# Patient Record
Sex: Male | Born: 1940 | Race: White | Hispanic: No | Marital: Married | Smoking: Never smoker
Health system: Southern US, Community
[De-identification: ages and names within clinical notes are randomized; demographics above are authoritative.]

## PROBLEM LIST (undated history)

## (undated) DIAGNOSIS — E785 Hyperlipidemia, unspecified: Secondary | ICD-10-CM

## (undated) DIAGNOSIS — I4719 Other supraventricular tachycardia: Secondary | ICD-10-CM

## (undated) DIAGNOSIS — IMO0001 Reserved for inherently not codable concepts without codable children: Secondary | ICD-10-CM

## (undated) DIAGNOSIS — Z950 Presence of cardiac pacemaker: Secondary | ICD-10-CM

## (undated) DIAGNOSIS — H269 Unspecified cataract: Secondary | ICD-10-CM

## (undated) DIAGNOSIS — I428 Other cardiomyopathies: Secondary | ICD-10-CM

## (undated) DIAGNOSIS — F329 Major depressive disorder, single episode, unspecified: Secondary | ICD-10-CM

## (undated) DIAGNOSIS — I471 Supraventricular tachycardia: Secondary | ICD-10-CM

## (undated) DIAGNOSIS — I10 Essential (primary) hypertension: Secondary | ICD-10-CM

## (undated) DIAGNOSIS — K219 Gastro-esophageal reflux disease without esophagitis: Secondary | ICD-10-CM

## (undated) DIAGNOSIS — I509 Heart failure, unspecified: Secondary | ICD-10-CM

## (undated) DIAGNOSIS — M069 Rheumatoid arthritis, unspecified: Secondary | ICD-10-CM

## (undated) DIAGNOSIS — I442 Atrioventricular block, complete: Secondary | ICD-10-CM

## (undated) DIAGNOSIS — J189 Pneumonia, unspecified organism: Secondary | ICD-10-CM

## (undated) DIAGNOSIS — Z952 Presence of prosthetic heart valve: Secondary | ICD-10-CM

## (undated) DIAGNOSIS — F32A Depression, unspecified: Secondary | ICD-10-CM

## (undated) DIAGNOSIS — Z87448 Personal history of other diseases of urinary system: Secondary | ICD-10-CM

## (undated) DIAGNOSIS — Z8701 Personal history of pneumonia (recurrent): Secondary | ICD-10-CM

## (undated) HISTORY — DX: Personal history of pneumonia (recurrent): Z87.01

## (undated) HISTORY — DX: Other supraventricular tachycardia: I47.19

## (undated) HISTORY — DX: Depression, unspecified: F32.A

## (undated) HISTORY — DX: Hyperlipidemia, unspecified: E78.5

## (undated) HISTORY — PX: INSERT / REPLACE / REMOVE PACEMAKER: SUR710

## (undated) HISTORY — DX: Presence of prosthetic heart valve: Z95.2

## (undated) HISTORY — DX: Supraventricular tachycardia: I47.1

## (undated) HISTORY — DX: Essential (primary) hypertension: I10

## (undated) HISTORY — DX: Major depressive disorder, single episode, unspecified: F32.9

## (undated) HISTORY — DX: Unspecified cataract: H26.9

## (undated) HISTORY — DX: Pneumonia, unspecified organism: J18.9

## (undated) HISTORY — DX: Other cardiomyopathies: I42.8

## (undated) HISTORY — DX: Personal history of other diseases of urinary system: Z87.448

## (undated) HISTORY — PX: ANAL FISSURE REPAIR: SHX2312

## (undated) HISTORY — DX: Atrioventricular block, complete: I44.2

---

## 1991-07-04 HISTORY — PX: PACEMAKER INSERTION: SHX728

## 1991-07-04 HISTORY — PX: AORTIC VALVE REPLACEMENT: SHX41

## 1998-02-18 ENCOUNTER — Emergency Department (HOSPITAL_COMMUNITY): Admission: EM | Admit: 1998-02-18 | Discharge: 1998-02-18 | Payer: Self-pay | Admitting: Emergency Medicine

## 1999-08-07 ENCOUNTER — Encounter: Payer: Self-pay | Admitting: Emergency Medicine

## 1999-08-07 ENCOUNTER — Emergency Department (HOSPITAL_COMMUNITY): Admission: EM | Admit: 1999-08-07 | Discharge: 1999-08-07 | Payer: Self-pay | Admitting: Emergency Medicine

## 2004-05-10 ENCOUNTER — Ambulatory Visit: Payer: Self-pay | Admitting: Cardiology

## 2004-06-06 ENCOUNTER — Ambulatory Visit: Payer: Self-pay | Admitting: Cardiology

## 2004-12-12 ENCOUNTER — Ambulatory Visit: Payer: Self-pay | Admitting: Cardiology

## 2005-01-17 ENCOUNTER — Ambulatory Visit: Payer: Self-pay | Admitting: Cardiology

## 2005-03-28 ENCOUNTER — Ambulatory Visit: Payer: Self-pay | Admitting: *Deleted

## 2005-06-13 ENCOUNTER — Ambulatory Visit: Payer: Self-pay | Admitting: Cardiology

## 2005-08-30 ENCOUNTER — Ambulatory Visit: Payer: Self-pay | Admitting: Internal Medicine

## 2005-09-21 ENCOUNTER — Ambulatory Visit: Payer: Self-pay | Admitting: Cardiology

## 2005-10-19 ENCOUNTER — Ambulatory Visit: Payer: Self-pay | Admitting: Internal Medicine

## 2005-10-19 ENCOUNTER — Encounter: Payer: Self-pay | Admitting: Cardiology

## 2005-10-19 ENCOUNTER — Ambulatory Visit: Payer: Self-pay

## 2005-12-06 ENCOUNTER — Ambulatory Visit: Payer: Self-pay | Admitting: Cardiology

## 2006-02-20 ENCOUNTER — Ambulatory Visit: Payer: Self-pay | Admitting: Cardiology

## 2006-04-02 ENCOUNTER — Ambulatory Visit: Payer: Self-pay | Admitting: Cardiology

## 2006-07-06 ENCOUNTER — Ambulatory Visit: Payer: Self-pay | Admitting: Cardiology

## 2006-09-05 ENCOUNTER — Ambulatory Visit: Payer: Self-pay | Admitting: *Deleted

## 2006-10-23 ENCOUNTER — Ambulatory Visit: Payer: Self-pay | Admitting: Cardiology

## 2007-01-29 ENCOUNTER — Ambulatory Visit: Payer: Self-pay | Admitting: Cardiology

## 2007-02-14 ENCOUNTER — Ambulatory Visit: Payer: Self-pay | Admitting: Cardiovascular Disease

## 2007-03-20 ENCOUNTER — Ambulatory Visit: Payer: Self-pay | Admitting: Internal Medicine

## 2007-05-09 ENCOUNTER — Ambulatory Visit: Payer: Self-pay | Admitting: Cardiology

## 2007-05-17 ENCOUNTER — Encounter: Payer: Self-pay | Admitting: Internal Medicine

## 2007-05-17 DIAGNOSIS — E785 Hyperlipidemia, unspecified: Secondary | ICD-10-CM | POA: Insufficient documentation

## 2007-05-17 DIAGNOSIS — I10 Essential (primary) hypertension: Secondary | ICD-10-CM | POA: Insufficient documentation

## 2007-05-18 ENCOUNTER — Telehealth: Payer: Self-pay | Admitting: Family Medicine

## 2007-07-25 ENCOUNTER — Ambulatory Visit: Payer: Self-pay | Admitting: Cardiology

## 2007-08-29 ENCOUNTER — Ambulatory Visit: Payer: Self-pay | Admitting: Cardiology

## 2007-10-30 ENCOUNTER — Ambulatory Visit: Payer: Self-pay | Admitting: Cardiology

## 2007-11-20 ENCOUNTER — Ambulatory Visit: Payer: Self-pay | Admitting: Cardiology

## 2008-01-31 ENCOUNTER — Ambulatory Visit: Payer: Self-pay | Admitting: Cardiology

## 2008-04-06 ENCOUNTER — Ambulatory Visit: Payer: Self-pay | Admitting: Cardiology

## 2008-05-04 ENCOUNTER — Ambulatory Visit: Payer: Self-pay | Admitting: Cardiovascular Disease

## 2008-06-22 ENCOUNTER — Ambulatory Visit: Payer: Self-pay | Admitting: Cardiology

## 2008-08-12 ENCOUNTER — Ambulatory Visit: Payer: Self-pay | Admitting: Cardiology

## 2008-10-09 ENCOUNTER — Encounter: Payer: Self-pay | Admitting: Cardiology

## 2008-10-26 ENCOUNTER — Ambulatory Visit: Payer: Self-pay | Admitting: Cardiology

## 2008-12-01 ENCOUNTER — Encounter: Payer: Self-pay | Admitting: *Deleted

## 2008-12-07 ENCOUNTER — Ambulatory Visit: Payer: Self-pay | Admitting: Cardiovascular Disease

## 2008-12-07 LAB — CONVERTED CEMR LAB
POC INR: 2.7
Protime: 19.9

## 2008-12-21 ENCOUNTER — Encounter: Payer: Self-pay | Admitting: Cardiology

## 2009-01-06 ENCOUNTER — Encounter: Payer: Self-pay | Admitting: *Deleted

## 2009-01-08 ENCOUNTER — Ambulatory Visit: Payer: Self-pay | Admitting: Cardiology

## 2009-01-08 LAB — CONVERTED CEMR LAB
POC INR: 2
Prothrombin Time: 17.3 s

## 2009-02-05 ENCOUNTER — Ambulatory Visit: Payer: Self-pay | Admitting: Cardiology

## 2009-02-05 LAB — CONVERTED CEMR LAB
POC INR: 2
Prothrombin Time: 17.3 s

## 2009-02-19 ENCOUNTER — Ambulatory Visit: Payer: Self-pay | Admitting: Family Medicine

## 2009-02-19 DIAGNOSIS — R609 Edema, unspecified: Secondary | ICD-10-CM | POA: Insufficient documentation

## 2009-02-19 DIAGNOSIS — Z952 Presence of prosthetic heart valve: Secondary | ICD-10-CM | POA: Insufficient documentation

## 2009-02-19 DIAGNOSIS — F329 Major depressive disorder, single episode, unspecified: Secondary | ICD-10-CM | POA: Insufficient documentation

## 2009-02-19 DIAGNOSIS — R351 Nocturia: Secondary | ICD-10-CM | POA: Insufficient documentation

## 2009-02-19 DIAGNOSIS — B354 Tinea corporis: Secondary | ICD-10-CM | POA: Insufficient documentation

## 2009-02-19 DIAGNOSIS — F3289 Other specified depressive episodes: Secondary | ICD-10-CM | POA: Insufficient documentation

## 2009-02-23 ENCOUNTER — Ambulatory Visit: Payer: Self-pay | Admitting: Internal Medicine

## 2009-02-23 LAB — CONVERTED CEMR LAB: POC INR: 2.4

## 2009-03-18 DIAGNOSIS — Z950 Presence of cardiac pacemaker: Secondary | ICD-10-CM | POA: Insufficient documentation

## 2009-03-19 ENCOUNTER — Encounter: Payer: Self-pay | Admitting: Cardiology

## 2009-03-19 ENCOUNTER — Ambulatory Visit: Payer: Self-pay | Admitting: Cardiology

## 2009-03-19 ENCOUNTER — Ambulatory Visit: Payer: Self-pay | Admitting: Internal Medicine

## 2009-03-19 LAB — CONVERTED CEMR LAB: POC INR: 2.7

## 2009-04-28 ENCOUNTER — Ambulatory Visit: Payer: Self-pay | Admitting: Cardiology

## 2009-04-28 LAB — CONVERTED CEMR LAB: POC INR: 2.1

## 2009-05-12 ENCOUNTER — Ambulatory Visit: Payer: Self-pay | Admitting: Family Medicine

## 2009-05-12 DIAGNOSIS — F528 Other sexual dysfunction not due to a substance or known physiological condition: Secondary | ICD-10-CM | POA: Insufficient documentation

## 2009-05-12 DIAGNOSIS — M129 Arthropathy, unspecified: Secondary | ICD-10-CM | POA: Insufficient documentation

## 2009-05-12 DIAGNOSIS — I10 Essential (primary) hypertension: Secondary | ICD-10-CM | POA: Insufficient documentation

## 2009-05-12 LAB — CONVERTED CEMR LAB
Bilirubin Urine: NEGATIVE
Blood in Urine, dipstick: NEGATIVE
Glucose, Urine, Semiquant: NEGATIVE
Ketones, urine, test strip: NEGATIVE
Nitrite: NEGATIVE
Protein, U semiquant: NEGATIVE
Specific Gravity, Urine: 1.01
Urobilinogen, UA: 0.2
WBC Urine, dipstick: NEGATIVE
pH: 7

## 2009-05-23 LAB — CONVERTED CEMR LAB
ALT: 22 units/L (ref 0–53)
AST: 14 units/L (ref 0–37)
Albumin: 4.3 g/dL (ref 3.5–5.2)
BUN: 14 mg/dL (ref 6–23)
Basophils Relative: 1.1 % (ref 0.0–3.0)
CO2: 30 meq/L (ref 19–32)
Chloride: 103 meq/L (ref 96–112)
Cholesterol: 231 mg/dL — ABNORMAL HIGH (ref 0–200)
Creatinine, Ser: 1 mg/dL (ref 0.4–1.5)
Direct LDL: 145.6 mg/dL
Eosinophils Absolute: 0.4 10*3/uL (ref 0.0–0.7)
Eosinophils Relative: 6.5 % — ABNORMAL HIGH (ref 0.0–5.0)
HCT: 39.6 % (ref 39.0–52.0)
Lymphs Abs: 2.1 10*3/uL (ref 0.7–4.0)
MCHC: 34.6 g/dL (ref 30.0–36.0)
MCV: 89 fL (ref 78.0–100.0)
Monocytes Absolute: 0.7 10*3/uL (ref 0.1–1.0)
Neutrophils Relative %: 51.8 % (ref 43.0–77.0)
PSA: 1.46 ng/mL (ref 0.10–4.00)
Potassium: 4.1 meq/L (ref 3.5–5.1)
RBC: 4.45 M/uL (ref 4.22–5.81)
Total Bilirubin: 0.9 mg/dL (ref 0.3–1.2)
Total CHOL/HDL Ratio: 6
Total Protein: 6.9 g/dL (ref 6.0–8.3)

## 2009-06-18 ENCOUNTER — Ambulatory Visit: Payer: Self-pay | Admitting: Cardiovascular Disease

## 2009-07-30 ENCOUNTER — Ambulatory Visit: Payer: Self-pay | Admitting: Internal Medicine

## 2009-08-26 ENCOUNTER — Telehealth: Payer: Self-pay | Admitting: Family Medicine

## 2009-08-27 ENCOUNTER — Ambulatory Visit: Payer: Self-pay | Admitting: Cardiology

## 2009-09-24 ENCOUNTER — Ambulatory Visit: Payer: Self-pay | Admitting: Internal Medicine

## 2009-10-21 ENCOUNTER — Ambulatory Visit: Payer: Self-pay | Admitting: Cardiology

## 2009-11-18 ENCOUNTER — Ambulatory Visit: Payer: Self-pay | Admitting: Internal Medicine

## 2009-11-18 LAB — CONVERTED CEMR LAB: POC INR: 2.6

## 2009-12-02 ENCOUNTER — Telehealth: Payer: Self-pay | Admitting: Family Medicine

## 2009-12-21 ENCOUNTER — Encounter: Payer: Self-pay | Admitting: Family Medicine

## 2009-12-21 ENCOUNTER — Telehealth: Payer: Self-pay | Admitting: Family Medicine

## 2010-01-26 ENCOUNTER — Ambulatory Visit: Payer: Self-pay | Admitting: Cardiology

## 2010-01-26 LAB — CONVERTED CEMR LAB: POC INR: 3.1

## 2010-02-23 ENCOUNTER — Ambulatory Visit: Payer: Self-pay | Admitting: Internal Medicine

## 2010-02-23 LAB — CONVERTED CEMR LAB: POC INR: 2.7

## 2010-03-23 ENCOUNTER — Ambulatory Visit: Payer: Self-pay | Admitting: Cardiology

## 2010-03-24 ENCOUNTER — Ambulatory Visit: Payer: Self-pay | Admitting: Cardiology

## 2010-04-20 ENCOUNTER — Ambulatory Visit: Payer: Self-pay | Admitting: Internal Medicine

## 2010-04-20 LAB — CONVERTED CEMR LAB: POC INR: 3

## 2010-04-29 ENCOUNTER — Encounter (INDEPENDENT_AMBULATORY_CARE_PROVIDER_SITE_OTHER): Payer: Self-pay | Admitting: *Deleted

## 2010-05-05 ENCOUNTER — Ambulatory Visit: Payer: Self-pay | Admitting: Family Medicine

## 2010-05-05 DIAGNOSIS — M25519 Pain in unspecified shoulder: Secondary | ICD-10-CM | POA: Insufficient documentation

## 2010-05-06 LAB — CONVERTED CEMR LAB
BUN: 17 mg/dL (ref 6–23)
Basophils Absolute: 0.1 10*3/uL (ref 0.0–0.1)
Creatinine, Ser: 1 mg/dL (ref 0.4–1.5)
Eosinophils Relative: 5.5 % — ABNORMAL HIGH (ref 0.0–5.0)
GFR calc non Af Amer: 76.88 mL/min (ref >60)
Glucose, Bld: 75 mg/dL (ref 70–99)
HCT: 41.3 % (ref 39.0–52.0)
Lymphs Abs: 2.6 10*3/uL (ref 0.7–4.0)
Monocytes Absolute: 0.8 10*3/uL (ref 0.1–1.0)
Monocytes Relative: 8.7 % (ref 3.0–12.0)
Neutrophils Relative %: 57.4 % (ref 43.0–77.0)
PSA: 1.46 ng/mL (ref 0.10–4.00)
Platelets: 331 10*3/uL (ref 150.0–400.0)
RDW: 14.2 % (ref 11.5–14.6)
Sodium: 141 meq/L (ref 135–145)
TSH: 1.61 microintl units/mL (ref 0.35–5.50)
WBC: 9.5 10*3/uL (ref 4.5–10.5)

## 2010-05-20 ENCOUNTER — Ambulatory Visit: Payer: Self-pay | Admitting: Family Medicine

## 2010-06-24 ENCOUNTER — Ambulatory Visit: Payer: Self-pay | Admitting: Cardiology

## 2010-07-01 ENCOUNTER — Telehealth: Payer: Self-pay | Admitting: Family Medicine

## 2010-07-05 ENCOUNTER — Telehealth: Payer: Self-pay | Admitting: Family Medicine

## 2010-07-05 ENCOUNTER — Ambulatory Visit: Admit: 2010-07-05 | Payer: Self-pay | Admitting: Family Medicine

## 2010-07-06 ENCOUNTER — Ambulatory Visit
Admission: RE | Admit: 2010-07-06 | Discharge: 2010-07-06 | Payer: Self-pay | Source: Home / Self Care | Attending: Family Medicine | Admitting: Family Medicine

## 2010-07-06 DIAGNOSIS — M67919 Unspecified disorder of synovium and tendon, unspecified shoulder: Secondary | ICD-10-CM | POA: Insufficient documentation

## 2010-07-06 DIAGNOSIS — M719 Bursopathy, unspecified: Secondary | ICD-10-CM

## 2010-07-19 ENCOUNTER — Ambulatory Visit: Admit: 2010-07-19 | Payer: Self-pay

## 2010-07-20 ENCOUNTER — Telehealth: Payer: Self-pay | Admitting: Family Medicine

## 2010-07-31 LAB — CONVERTED CEMR LAB: POC INR: 3

## 2010-08-01 ENCOUNTER — Telehealth: Payer: Self-pay | Admitting: Family Medicine

## 2010-08-02 NOTE — Medication Information (Signed)
Summary: rov/ewj  Anticoagulant Therapy  Managed by: Bethena Midget, RN, BSN Referring MD: Charlies Constable MD PCP: Judithann Sheen MD Supervising MD: Myrtis Ser MD, Tinnie Gens Indication 1: Aortic Valve Replacement (ICD-V43.3) Indication 2: Aortic Valve Disorder (ICD-424.1) Lab Used: [facility] Site: [facility] INR POC 2.6 INR RANGE 2.50-3.50  Dietary changes: no    Health status changes: no    Bleeding/hemorrhagic complications: no    Recent/future hospitalizations: no    Any changes in medication regimen? no    Recent/future dental: no  Any missed doses?: no       Is patient compliant with meds? yes       Allergies: No Known Drug Allergies  Anticoagulation Management History:      The patient is taking warfarin and comes in today for a routine follow up visit.  Positive risk factors for bleeding include an age of 70 years or older.  The bleeding index is 'intermediate risk'.  Positive CHADS2 values include History of HTN.  Negative CHADS2 values include Age > 35 years old.  The start date was 11/06/1997.  Anticoagulation responsible provider: Myrtis Ser MD, Tinnie Gens.  INR POC: 2.6.  Cuvette Lot#: [id number].  Exp: 12/2010.    Anticoagulation Management Assessment/Plan:      The patient's current anticoagulation dose is Warfarin sodium 5 mg tabs: Use as directed by Anticoagulation Clinic.  The target INR is 2.5-3.5.  The next INR is due 11/18/2009.  Anticoagulation instructions were given to patient.  Results were reviewed/authorized by Bethena Midget, RN, BSN.  He was notified by Bethena Midget, RN, BSN.         Prior Anticoagulation Instructions: INR 2.6  Continue on same dosage 5mg  daily.  Recheck in 4 weeks.    Current Anticoagulation Instructions: INR 2.6 Continue 5mg s daily. Recheck in 4 weeks.

## 2010-08-02 NOTE — Assessment & Plan Note (Signed)
Summary: shoulder pain/dm   Vital Signs:  Patient profile:   70 year old male Height:      70 inches (177.80 cm) Weight:      192 pounds (87.27 kg) O2 Sat:      98 % on Room air Temp:     97.6 degrees F (36.44 degrees C) oral Pulse rate:   71 / minute BP sitting:   160 / 84  (left arm) Cuff size:   large  Vitals Entered By: Josph Macho RMA (May 20, 2010 9:24 AM)  O2 Flow:  Room air CC: Shoulder pain/ CF Is Patient Diabetic? No   History of Present Illness: Patient is a 69 year old Caucasian male in today for reevaluation of shoulder pain. his shoulder pain in both shoulders. and his left shoulder blade. He has some neck pain as well. at his last visit we checked x-rays of his neck and his shoulders and we did see some changes in his neck but nothing significant in her shoulders. His left shoulder is bothering him more than his right shoulder. He notes it is worse after sleeping he awakens in the morning sometimes that his left shoulder blade and sometimes radicular symptoms and pain down the upper part of his arm are noted. No numbness or tingling or weakness in his hands. Denies any falls, headaches, fevers, chills, chest pain, palpitations, shortness or breath, GI or GU complaints. He does he did not take his amlodipine and today and he has not been taking his triamterene HCTZ. He notes taking some Tylenol and cyclobenzaprine together at night one night to help his shoulder but he felt still tired the next morning after taking just one of the 5 mg cyclobenzaprine so he has not taken this in he has never seen a orthopedist for his neck or his shoulders and does not want to go thus far  Current Medications (verified): 1)  Warfarin Sodium 5 Mg Tabs (Warfarin Sodium) .... Use As Directed By Anticoagulation Clinic 2)  Triamterene-Hctz 37.5-25 Mg  Tabs (Triamterene-Hctz) .... Take 1/2 Tablet Once Daily 3)  Amlodipine Besylate 10 Mg Tabs (Amlodipine Besylate) .... Take One Tablet By  Mouth Daily 4)  Flomax 0.4 Mg Caps (Tamsulosin Hcl) .Marland Kitchen.. 1 Once Daily For Prevention of Nocturia 5)  Multivitamins   Tabs (Multiple Vitamin) .Marland Kitchen.. 1 Per Week 6)  Prilosec 20 Mg Cpdr (Omeprazole) .Marland Kitchen.. 1 Qd 7)  Fish Oil 1000 Mg Caps (Omega-3 Fatty Acids) .... Take 2 Capsule Daily 8)  Cyclobenzaprine Hcl 5 Mg Tabs (Cyclobenzaprine Hcl) .Marland Kitchen.. 1-2 Tabs By Mouth At Bedtime As Needed Pain/muscle Spasm 9)  Extra Strength Acetaminophen 500 Mg Caps (Acetaminophen) .... 2 Tabs By Mouth Two Times A Day and A 3rd Dose As Needed For Severe Pain  Allergies (verified): No Known Drug Allergies  Past History:  Past medical history reviewed for relevance to current acute and chronic problems. Social history (including risk factors) reviewed for relevance to current acute and chronic problems.  Past Medical History: Reviewed history from 03/18/2009 and no changes required. 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 Status post at [facility]. Jude aortic valve replacement for aortic     stenosis in 1993. 2. 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 3. Ejection fraction at 60%. 4. Hypertension. 5. Hyperlipidemia.   Social History: Reviewed history  from 05/05/2010 and no changes required. Marital Status: Married, accompanied by wife Children:  Occupation:   Review of Systems      See HPI  Physical Exam  General:  Well-developed,well-nourished,in no acute distress; alert,appropriate and cooperative throughout examination Head:  Normocephalic and atraumatic without obvious abnormalities. No apparent alopecia or balding. Mouth:  Oral mucosa and oropharynx without lesions or exudates.  Teeth in good repair. Neck:  No deformities, masses, or tenderness noted. Lungs:  Normal respiratory effort, chest expands symmetrically.  Lungs are clear to auscultation, no crackles or wheezes. Heart:  Normal rate and regular rhythm. S1 and S2 normal without galloop, rub or other extra sounds. Abdomen:  Bowel sounds positive,abdomen soft and non-tender without masses, organomegaly or hernias noted. Msk:  Mild pain with palp over b/l SCM muscles and over left Trapezius muscle Extremities:  1+ left pedal edema and 1+ right pedal edema.   Psych:  Cognition and judgment appear intact. Alert and cooperative with normal attention span and concentration. No apparent delusions, illusions, hallucinations   Impression & Recommendations:  Problem # 1:  SHOULDER PAIN, BILATERAL (ICD-719.41)  His updated medication list for this problem includes:    Cyclobenzaprine Hcl 5 Mg Tabs (Cyclobenzaprine hcl) .Marland Kitchen... 1-2 tabs by mouth at bedtime as needed pain/muscle spasm    Extra Strength Acetaminophen 500 Mg Caps (Acetaminophen) .Marland Kitchen... 2 tabs by mouth two times a day and a 3rd dose as needed for severe pain Likely related to degenerative changes, most notably in cervical spine. encouraged alternate heat and ice, try Lidoderm patches or if insurance will not pay for them try [facility] patches. Try just 1/2 of Cyclobenzaprine along with a Tylenol at bedtime if no improvement consider orthopaedic referral  Problem # 2:  ESSENTIAL HYPERTENSION, BENIGN (ICD-401.1)  His updated medication list for this problem includes:    Triamterene-hctz 37.5-25 Mg Tabs (Triamterene-hctz) .Marland Kitchen... Take 1/2 tablet once daily in am    Amlodipine Besylate 10 Mg Tabs (Amlodipine besylate) .Marland Kitchen... Take one tablet by mouth at bedtime Moved his Amlodipine to at bedtime and asked him to restart the Maxzide for the HTN and edema  Complete Medication List: 1)  Warfarin Sodium 5 Mg Tabs (Warfarin sodium) .... Use as directed by anticoagulation clinic 2)  Triamterene-hctz 37.5-25 Mg Tabs (Triamterene-hctz) .... Take 1/2 tablet once daily in am 3)  Amlodipine Besylate 10 Mg Tabs  (Amlodipine besylate) .... Take one tablet by mouth at bedtime 4)  Flomax 0.4 Mg Caps (Tamsulosin hcl) .Marland Kitchen.. 1 once daily for prevention of nocturia 5)  Multivitamins Tabs (Multiple vitamin) .Marland Kitchen.. 1 per week 6)  Prilosec 20 Mg Cpdr (Omeprazole) .Marland Kitchen.. 1 qd 7)  Fish Oil 1000 Mg Caps (Omega-3 fatty acids) .... Take 2 capsule daily 8)  Cyclobenzaprine Hcl 5 Mg Tabs (Cyclobenzaprine hcl) .Marland Kitchen.. 1-2 tabs by mouth at bedtime as needed pain/muscle spasm 9)  Extra Strength Acetaminophen 500 Mg Caps (Acetaminophen) .... 2 tabs by mouth two times a day and a 3rd dose as needed for severe pain 10)  Lidoderm 5 % Ptch (Lidocaine) .Marland Kitchen.. 1 patch topically to affected area off after 12 hours, 1 patch per area in 24 hours.  Patient Instructions: 1)  Please schedule a follow-up appointment in 2 months.  2)  Limit your Sodium(salt) .  3)  Elevate feet above heart for roughly 15 minutes twice a day when swelling occurs.  4)  Try Icy Hot patches, Memory Foam pillow 5)  Consider Orthopaedic consult about neck and shoulders. Prescriptions: LIDODERM  5 % PTCH (LIDOCAINE) 1 patch topically to affected area off after 12 hours, 1 patch per area in 24 hours.  #30 x 2   Entered and Authorized by:   Danise Edge MD   Signed by:   Danise Edge MD on 05/20/2010   Method used:   Electronically to        [facility]* (retail)       [address] [place], [place]  [address]       Ph: [phone]       Fax: [phone]   RxID:   [id number]    Orders Added: 1)  Est. Patient Level III [16010]

## 2010-08-02 NOTE — Medication Information (Signed)
Summary: rov/ln   Anticoagulant Therapy  Managed by: Weston Brass, PharmD Referring MD: Charlies Constable MD PCP: Judithann Sheen MD Supervising MD: Johney Frame MD, Fayrene Fearing Indication 1: Aortic Valve Replacement (ICD-V43.3) Indication 2: Aortic Valve Disorder (ICD-424.1) Lab Used: [facility] Site: [facility] INR POC 2.7 INR RANGE 2.50-3.50  Dietary changes: no    Health status changes: no    Bleeding/hemorrhagic complications: no    Recent/future hospitalizations: no    Any changes in medication regimen? no    Recent/future dental: no  Any missed doses?: no       Is patient compliant with meds? yes       Allergies: No Known Drug Allergies  Anticoagulation Management History:      The patient is taking warfarin and comes in today for a routine follow up visit.  Positive risk factors for bleeding include an age of 70 years or older.  The bleeding index is 'intermediate risk'.  Positive CHADS2 values include History of HTN.  Negative CHADS2 values include Age > 91 years old.  The start date was 11/06/1997.  Anticoagulation responsible provider: Melissia Lahman MD, Fayrene Fearing.  INR POC: 2.7.  Cuvette Lot#: [id number].  Exp: 04/2011.    Anticoagulation Management Assessment/Plan:      The patient's current anticoagulation dose is Warfarin sodium 5 mg tabs: Use as directed by Anticoagulation Clinic.  The target INR is 2.5-3.5.  The next INR is due 03/23/2010.  Anticoagulation instructions were given to patient.  Results were reviewed/authorized by Weston Brass, PharmD.  He was notified by Gweneth Fritter, PharmD Candidate.         Prior Anticoagulation Instructions: INR 3.1  Continue same dose of 1 tab daily.  Re-check in 4 weeks.  Current Anticoagulation Instructions: INR 2.7  Continue taking 1 tablet (5mg ) every day.  Recheck in 4 weeks.

## 2010-08-02 NOTE — Procedures (Signed)
Summary: Cardiology Device Clinic    Allergies: No Known Drug Allergies  PPM Specifications Following MD:  Everardo Beals. Juanda Chance, MD     PPM Vendor:  St Jude     PPM Model Number:  [id number]     PPM Serial Number:  [id number] PPM DOI:  08/26/1991     PPM Implanting MD:  Everardo Beals. Juanda Chance, MD  Lead 1    Location: [facility]     DOI: 08/26/1991     Model #: [id number]     Serial #: [id number]     Status: active Lead 2    Location: RV     DOI: 08/26/1991     Model #: [id number]     Serial #: [id number]     Status: active   Indications:  COMPLETE HEART BLOCK   PPM Follow Up Battery Voltage:  2.67 V     Battery Est. Longevity:  12 mths     Pacer Dependent:  No       PPM Device Measurements Atrium  Amplitude: 2.0 mV, Impedance: 486 ohms, Threshold: 0.5 V at 0.4 msec Right Ventricle  Amplitude: 5.0 mV, Impedance: 467 ohms, Threshold: 1.0 V at 0.8 msec  Episodes MS Episodes:  0     Coumadin:  Yes Ventricular High Rate:  0      Parameters Mode:  DDD     Lower Rate Limit:  50     Paced AV Delay:  225     Next Cardiology Appt Due:  09/05/2010 Tech Comments:  NORMAL DEVICE FUNCTION.  NO CHANGES MADE. PER JOHN AT [facility] 12 MTHS TO ERI W/100% PACING.  ROV IN 6 MTHS W/DEVICE CLINIC. Vella Kohler  March 24, 2010 9:53 AM

## 2010-08-02 NOTE — Assessment & Plan Note (Signed)
Summary: ROV/ SHOULDER PAIN/MM   Vital Signs:  Patient profile:   70 year old male Height:      70 inches (177.80 cm) Weight:      189 pounds (85.91 kg) O2 Sat:      98 % on Room air Temp:     97.5 degrees F (36.39 degrees C) oral Pulse rate:   67 / minute BP sitting:   116 / 84  (left arm) Cuff size:   large  Vitals Entered By: Josph Macho RMA (May 05, 2010 12:05 PM)  O2 Flow:  Room air CC: Shoulder pain (both) more pain in left shoulder X August/ CF Is Patient Diabetic? No   History of Present Illness: she is a 70 year old Caucasian male in today for evaluation of shoulder pain. He has been struggling with stiffness and pain in his shoulders since August. He denies any history nor recent history of, or injury. Describes the pain is worse at night and very stiff and painful in the morning. When he is up and moving around the pain is significantly improved. Pain is significantly worse than the left than the right but does affirm both sides. Tried multiple different physicians while sleeping and that has not been helpful he is using extra pillows and that has not been helpful. Tylenol arthritis with temporary pain but the pain recurs and he has been afraid to take the medication he chews warnings of taking excess medications. He does acknowledge both his parents is troubled with osteoarthritis over the years. He denies any significant neck pain, headaches. Denies any radicular symptoms or arm no pain further down the back. Denies chest pain, palpitations, shortness of breath, GI or GU concerns he  Current Medications (verified): 1)  Warfarin Sodium 5 Mg Tabs (Warfarin Sodium) .... Use As Directed By Anticoagulation Clinic 2)  Triamterene-Hctz 37.5-25 Mg  Tabs (Triamterene-Hctz) .... Take 1/2 Tablet Once Daily 3)  Tylenol 325 Mg  Tabs (Acetaminophen) .... As Needed 4)  Amlodipine Besylate 10 Mg Tabs (Amlodipine Besylate) .... Take One Tablet By Mouth Daily 5)  Flomax 0.4 Mg Caps  (Tamsulosin Hcl) .Marland Kitchen.. 1 Once Daily For Prevention of Nocturia 6)  Multivitamins   Tabs (Multiple Vitamin) .Marland Kitchen.. 1 Per Week 7)  Prilosec 20 Mg Cpdr (Omeprazole) .Marland Kitchen.. 1 Qd 8)  Fish Oil 1000 Mg Caps (Omega-3 Fatty Acids) .... Take 1 Capsule Daily  Allergies (verified): No Known Drug Allergies  Past History:  Past Surgical History: Last updated: 03/22/2010  St. Jude aortic valve replacement for aortic   stenosis in 1993.   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   Risk Factors: Smoking Status: never (05/17/2007)  Past medical history reviewed for relevance to current acute and chronic problems. Social history (including risk factors) reviewed for relevance to current acute and chronic problems.  Past Medical History: Reviewed history from 03/18/2009 and no changes required. 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 Status post at [facility]. Jude aortic valve replacement for aortic     stenosis in 1993. 2. 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 3. Ejection fraction at 60%. 4. Hypertension. 5. Hyperlipidemia.   Family History: Mother and father both lived into 33s, Mom still living,  both had arthritis  Social History: Reviewed history and no changes required. Marital Status: Married, accompanied by wife Children:  Occupation:   Review of Systems      See HPI       Flu Vaccine Consent Questions     Do you have a history of severe allergic reactions to this vaccine? no    Any prior history of allergic reactions to egg and/or gelatin? no    Do you have a sensitivity to the preservative Thimersol? no    Do you have a past history of Guillan-Barre Syndrome? no    Do you currently have an  acute febrile illness? no    Have you ever had a severe reaction to latex? no    Vaccine information given and explained to patient? yes    Are you currently pregnant? no    Lot Number:[id number]   Exp Date:12/31/2010   Site Given  Left Deltoid IM Josph Macho RMA  May 05, 2010 12:09 PM   Physical Exam  General:  Well-developed,well-nourished,in no acute distress; alert,appropriate and cooperative throughout examination Head:  Normocephalic and atraumatic without obvious abnormalities. No apparent alopecia or balding. Mouth:  Oral mucosa and oropharynx without lesions or exudates.  Teeth in good repair. Neck:  No deformities, masses, or tenderness noted. Lungs:  Normal respiratory effort, chest expands symmetrically. Lungs are clear to auscultation, no crackles or wheezes. Heart:  normal rate, regular rhythm, and systolic click.  Pacer in left upper chest wall Abdomen:  Bowel sounds positive,abdomen soft and non-tender without masses, organomegaly or hernias noted. Msk:  spasm noted over b/l posterior shoulders, mild pain with palp. No bony abnormalities, FROM both shoulders and in neck. No point tenderness over the actual shoulder girdle or in either arm. 5/5 strength in b/l UE. Gross motor and sensation intact in b/l UE Extremities:  No clubbing, cyanosis, edema, or deformity noted with normal full range of motion of all joints.   Cervical Nodes:  No lymphadenopathy noted Psych:  Cognition and judgment appear intact. Alert and cooperative with normal attention span and concentration. No apparent delusions, illusions, hallucinations   Impression & Recommendations:  Problem # 1:  SHOULDER PAIN, BILATERAL (ICD-719.41)  The following medications were removed from the medication list:    Tylenol 325 Mg Tabs (Acetaminophen) .Marland Kitchen... As needed His updated medication list for this problem includes:    Cyclobenzaprine Hcl 5 Mg Tabs (Cyclobenzaprine hcl) .Marland Kitchen... 1-2 tabs by mouth at bedtime  as needed pain/muscle spasm    Extra Strength Acetaminophen 500 Mg Caps (Acetaminophen) .Marland Kitchen... 2 tabs by mouth two times a day and a 3rd dose as needed for severe pain  Orders: T-Cervicle Spine 2-3 Views (72040TC) T-Shoulder Left Min 2 Views (73030TC) T-Shoulder Right (73030TC)Instructed on simple stretching after moist heat application  Problem # 2:  ESSENTIAL HYPERTENSION, BENIGN (ICD-401.1)  His updated medication list for this problem includes:    Triamterene-hctz 37.5-25 Mg Tabs (Triamterene-hctz) .Marland Kitchen... Take 1/2 tablet once daily    Amlodipine Besylate 10 Mg Tabs (Amlodipine besylate) .Marland Kitchen... Take one tablet by mouth daily  Orders: TLB-Renal Function Panel (80069-RENAL) TLB-CBC Platelet - w/Differential (85025-CBCD) TLB-TSH (Thyroid Stimulating Hormone) (84443-TSH) Venipuncture (91478) Specimen Handling (29562) No changes  Problem # 3:  AORTIC VALVE REPLACEMENT, HX OF (ICD-V43.3) On Coumadin will have him notify them of the changes in his meds when he goes for his next INR check in 2 weeks.  Problem # 4:  HYPERLIPIDEMIA (ICD-272.4) Increase fish oil caps to 2  daily  Complete Medication List: 1)  Warfarin Sodium 5 Mg Tabs (Warfarin sodium) .... Use as directed by anticoagulation clinic 2)  Triamterene-hctz 37.5-25 Mg Tabs (Triamterene-hctz) .... Take 1/2 tablet once daily 3)  Amlodipine Besylate 10 Mg Tabs (Amlodipine besylate) .... Take one tablet by mouth daily 4)  Flomax 0.4 Mg Caps (Tamsulosin hcl) .Marland Kitchen.. 1 once daily for prevention of nocturia 5)  Multivitamins Tabs (Multiple vitamin) .Marland Kitchen.. 1 per week 6)  Prilosec 20 Mg Cpdr (Omeprazole) .Marland Kitchen.. 1 qd 7)  Fish Oil 1000 Mg Caps (Omega-3 fatty acids) .... Take 2 capsule daily 8)  Cyclobenzaprine Hcl 5 Mg Tabs (Cyclobenzaprine hcl) .Marland Kitchen.. 1-2 tabs by mouth at bedtime as needed pain/muscle spasm 9)  Extra Strength Acetaminophen 500 Mg Caps (Acetaminophen) .... 2 tabs by mouth two times a day and a 3rd dose as needed for severe  pain  Other Orders: Flu Vaccine 105yrs + MEDICARE PATIENTS (G9562) Administration Flu vaccine - MCR (G0008) TLB-PSA (Prostate Specific Antigen) (84153-PSA)  Patient Instructions: 1)  Take 1000 mg of tylenol every 6 hours as needed for relief of pain or comfort of fever. Avoid taking more than 3000 mg in a 24 hour period( can cause liver damage in higher doses).  2)  You may move around but avoid painful motions. Apply moist heat and gentle stretching of shoulders and neck each evening as directed.  3)  Please schedule a follow-up appointment in 1- 2 months.  4)  Hepatic Panel prior to visit ICD-9: 401.9 5)  Lipid panel prior to visit ICD-9 : 272.4 Prescriptions: CYCLOBENZAPRINE HCL 5 MG TABS (CYCLOBENZAPRINE HCL) 1-2 tabs by mouth at bedtime as needed pain/muscle spasm  #40 x 1   Entered and Authorized by:   Danise Edge MD   Signed by:   Danise Edge MD on 05/05/2010   Method used:   Electronically to        [facility]* (retail)       [address] [place], [place]  [address]       Ph: [phone]       Fax: [phone]   RxID:   [id number]    Orders Added: 1)  Flu Vaccine 79yrs + MEDICARE PATIENTS [Q2039] 2)  Administration Flu vaccine - MCR [G0008] 3)  TLB-Renal Function Panel [80069-RENAL] 4)  TLB-CBC Platelet - w/Differential [85025-CBCD] 5)  TLB-TSH (Thyroid Stimulating Hormone) [84443-TSH] 6)  TLB-PSA (Prostate Specific Antigen) [84153-PSA] 7)  Venipuncture [59563] 8)  Specimen Handling [99000] 9)  T-Cervicle Spine 2-3 Views [72040TC] 10)  T-Shoulder Left Min 2 Views [73030TC] 11)  T-Shoulder Right [73030TC] 12)  Est. Patient Level IV [87564]

## 2010-08-02 NOTE — Medication Information (Signed)
Summary: rov/ewj  Anticoagulant Therapy  Managed by: Cloyde Reams, RN, BSN Referring MD: Charlies Constable MD PCP: Judithann Sheen MD Supervising MD: Tenny Craw MD, Gunnar Fusi Indication 1: Aortic Valve Replacement (ICD-V43.3) Indication 2: Aortic Valve Disorder (ICD-424.1) Lab Used: [facility]  Site: [facility] INR POC 2.6 INR RANGE 2.50-3.50  Dietary changes: no    Health status changes: no    Bleeding/hemorrhagic complications: no    Recent/future hospitalizations: no    Any changes in medication regimen? no    Recent/future dental: no  Any missed doses?: no       Is patient compliant with meds? yes       Allergies (verified): No Known Drug Allergies  Anticoagulation Management History:      The patient is taking warfarin and comes in today for a routine follow up visit.  Positive risk factors for bleeding include an age of 70 years or older.  The bleeding index is 'intermediate risk'.  Positive CHADS2 values include History of HTN.  Negative CHADS2 values include Age > 57 years old.  The start date was 11/06/1997.  Anticoagulation responsible provider: Tenny Craw MD, Gunnar Fusi.  INR POC: 2.6.  Cuvette Lot#: [id number].  Exp: 11/2010.    Anticoagulation Management Assessment/Plan:      The patient's current anticoagulation dose is Warfarin sodium 5 mg tabs: Use as directed by Anticoagulation Clinic.  The target INR is 2.5-3.5.  The next INR is due 10/21/2009.  Anticoagulation instructions were given to patient.  Results were reviewed/authorized by Cloyde Reams, RN, BSN.  He was notified by Cloyde Reams RN.         Prior Anticoagulation Instructions: INR 2.7  Continue on same dosage 1 tablet daily.  Recheck in 4 weeks.    Current Anticoagulation Instructions: INR 2.6  Continue on same dosage 5mg  daily.  Recheck in 4 weeks.

## 2010-08-02 NOTE — Medication Information (Signed)
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 no    Recent/future dental: no  Any missed doses?: no       Is patient compliant with meds? yes       Allergies: No Known Drug Allergies  Anticoagulation Management History:      The patient is taking warfarin and comes in today for a routine follow up visit.  Positive risk factors for bleeding include an age of 70 years or older.  The bleeding index is 'intermediate risk'.  Positive CHADS2 values include History of HTN.  Negative CHADS2 values include Age > 70 years old.  The start date was 11/06/1997.  Anticoagulation responsible provider: Jens Som MD, Arlys John.  INR POC: 2.7.  Cuvette Lot#: [id number].  Exp: 10/2010.    Anticoagulation Management Assessment/Plan:      The patient's current anticoagulation dose is Warfarin sodium 5 mg tabs: Use as directed by Anticoagulation Clinic.  The target INR is 2.5-3.5.  The next INR is due 09/24/2009.  Anticoagulation instructions were given to patient.  Results were reviewed/authorized by Cloyde Reams, RN, BSN.  He was notified by Cloyde Reams RN.         Prior Anticoagulation Instructions: INR: 4.0 skip today's dose then resume to same dosage of 5mg  tablet daily. Recheck in 2-3 weeks  Current Anticoagulation Instructions: INR 2.7  Continue on same dosage 1 tablet daily.  Recheck in 4 weeks.

## 2010-08-02 NOTE — Medication Information (Signed)
Summary: rov/tm  Anticoagulant Therapy  Managed by: Eda Keys, PharmD Referring MD: Charlies Constable MD PCP: Judithann Sheen MD Supervising MD: Ladona Ridgel MD, Sharlot Gowda Indication 1: Aortic Valve Replacement (ICD-V43.3) Indication 2: Aortic Valve Disorder (ICD-424.1) [facility]  Site: [facility] INR POC 2.6 INR RANGE 2.50-3.50  Dietary changes: no    Health status changes: no    Bleeding/hemorrhagic complications: no    Recent/future hospitalizations: no    Any changes in medication regimen? no    Recent/future dental: no  Any missed doses?: no       Is patient compliant with meds? yes       Allergies: No Known Drug Allergies  Anticoagulation Management History:      The patient is taking warfarin and comes in today for a routine follow up visit.  Positive risk factors for bleeding include an age of 29 years or older.  The bleeding index is 'intermediate risk'.  Positive CHADS2 values include History of HTN.  Negative CHADS2 values include Age > 7 years old.  The start date was 11/06/1997.  Anticoagulation responsible provider: Ladona Ridgel MD, Sharlot Gowda.  INR POC: 2.6.  Cuvette Lot#: [id number].  Exp: 02/2011.    Anticoagulation Management Assessment/Plan:      The patient's current anticoagulation dose is Warfarin sodium 5 mg tabs: Use as directed by Anticoagulation Clinic.  The target INR is 2.5-3.5.  The next INR is due 12/16/2009.  Anticoagulation instructions were given to patient.  Results were reviewed/authorized by Eda Keys, PharmD.  He was notified by Eda Keys, PharmD.         Prior Anticoagulation Instructions: INR 2.6 Continue 5mg s daily. Recheck in 4 weeks.   Current Anticoagulation Instructions: INR 2.6  Continue taking 1 tablet (5 mg) daily.   Return to clinic in 4 weeks.

## 2010-08-02 NOTE — Progress Notes (Signed)
Summary: samples  Phone Note Call from Patient Call back at Home Phone [phone]   Caller: [facility] call Summary of Call: Need Cialis samples. Please call when ready. Initial call taken by: Warnell Forester,  August 26, 2009 1:46 PM  Follow-up for Phone Call        we dont have any samples if needs rx have have him request from his drug store thanks  Follow-up by: Pura Spice, RN,  August 26, 2009 1:49 PM  Additional Follow-up for Phone Call Additional follow up Details #1::        Pt. notified. Additional Follow-up by: Lynann Beaver CMA,  August 26, 2009 2:58 PM

## 2010-08-02 NOTE — Progress Notes (Signed)
Summary: Pt checking on status of paper work from [facility] from Patient Call back at [employer] [phone] Call back at [phone] James's cell   Caller: Spouse-Marti Summary of Call: Pts spouse called to check on status of paperwark from [facility], that Dr Scotty Court was suppose complete then sign. It was faxed end of May or beginning of June and no response. Pls call asap.  Initial call taken by: Lucy Antigua,  December 21, 2009 9:58 AM  Follow-up for Phone Call        dr Scotty Court called pt and pt informed he will complete papers. Follow-up by: Pura Spice, RN,  December 21, 2009 12:14 PM

## 2010-08-02 NOTE — Progress Notes (Signed)
Summary: Pt called and said that people from [employer] will be calling  Phone Note Call from Patient Call back at Home Phone [phone]   Caller: spouse-Marti Summary of Call: Pts spouse called and said that people [facility] are going to be calling Dr. Scotty Court sometime today.    Initial call taken by: Lucy Antigua,  December 02, 2009 10:56 AM  Follow-up for Phone Call        dr srtafford aware/  Follow-up by: Pura Spice, RN,  December 02, 2009 11:18 AM

## 2010-08-02 NOTE — Medication Information (Signed)
Summary: rov/jk  Anticoagulant Therapy  Managed by: Weston Brass, PharmD Referring MD: Charlies Constable MD PCP: Judithann Sheen MD Supervising MD: Juanda Chance MD, Julia Alkhatib Indication 1: Aortic Valve Replacement (ICD-V43.3) Indication 2: Aortic Valve Disorder (ICD-424.1) Lab Used: [facility] Site: [facility] INR POC 3.0 INR RANGE 2.50-3.50  Dietary changes: no    Health status changes: no    Bleeding/hemorrhagic complications: no    Recent/future hospitalizations: no    Any changes in medication regimen? no    Recent/future dental: no  Any missed doses?: no       Is patient compliant with meds? yes       Allergies: No Known Drug Allergies  Anticoagulation Management History:      The patient is taking warfarin and comes in today for a routine follow up visit.  Positive risk factors for bleeding include an age of 70 years or older.  The bleeding index is 'intermediate risk'.  Positive CHADS2 values include History of HTN.  Negative CHADS2 values include Age > 19 years old.  The start date was 11/06/1997.  Anticoagulation responsible provider: Juanda Chance MD, Smitty Cords.  INR POC: 3.0.  Cuvette Lot#: [id number].  Exp: 05/2011.    Anticoagulation Management Assessment/Plan:      The patient's current anticoagulation dose is Warfarin sodium 5 mg tabs: Use as directed by Anticoagulation Clinic.  The target INR is 2.5-3.5.  The next INR is due 04/20/2010.  Anticoagulation instructions were given to patient.  Results were reviewed/authorized by Weston Brass, PharmD.  He was notified by Weston Brass PharmD.         Prior Anticoagulation Instructions: INR 2.7  Continue taking 1 tablet (5mg ) every day.  Recheck in 4 weeks.   Current Anticoagulation Instructions: INR 3.0  Continue same dose of 1 tablet everyday.  Recheck INR in 4 weeks.

## 2010-08-02 NOTE — Medication Information (Signed)
Summary: rov/sp   Anticoagulant Therapy  Managed by: Weston Brass, PharmD Referring MD: Charlies Constable MD PCP: Judithann Sheen MD Supervising MD: Graciela Husbands MD, Viviann Spare Indication 1: Aortic Valve Replacement (ICD-V43.3) Indication 2: Aortic Valve Disorder (ICD-424.1) Lab Used: [facility] Site: [facility] INR POC 3.0 INR RANGE 2.50-3.50  Dietary changes: no    Health status changes: yes       Details: Cold started yesterday 10/18  Bleeding/hemorrhagic complications: no    Recent/future hospitalizations: no    Any changes in medication regimen? no    Recent/future dental: no  Any missed doses?: yes     Details: Missed one dose a few weeks ago.    Is patient compliant with meds? yes       Allergies: No Known Drug Allergies  Anticoagulation Management History:      The patient is taking warfarin and comes in today for a routine follow up visit.  Positive risk factors for bleeding include an age of 70 years or older.  The bleeding index is 'intermediate risk'.  Positive CHADS2 values include History of HTN.  Negative CHADS2 values include Age > 22 years old.  The start date was 11/06/1997.  Anticoagulation responsible provider: Graciela Husbands MD, Viviann Spare.  INR POC: 3.0.  Cuvette Lot#: [id number].  Exp: 05/2011.    Anticoagulation Management Assessment/Plan:      The patient's current anticoagulation dose is Warfarin sodium 5 mg tabs: Use as directed by Anticoagulation Clinic.  The target INR is 2.5-3.5.  The next INR is due 05/18/2010.  Anticoagulation instructions were given to patient.  Results were reviewed/authorized by Weston Brass, PharmD.  He was notified by Haynes Hoehn, PharmD Candidate.         Prior Anticoagulation Instructions: INR 3.0  Continue same dose of 1 tablet everyday.  Recheck INR in 4 weeks.   Current Anticoagulation Instructions: INR: 3.0  Continue Coumadin as scheduled:  1 tablet every day of the week.    Return to clinic in 4 weeks.

## 2010-08-02 NOTE — Miscellaneous (Signed)
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 

## 2010-08-02 NOTE — Medication Information (Signed)
Summary: rov/ewj  Anticoagulant Therapy  Managed by: Ysidro Evert, Pharm D Candidate Referring MD: Charlies Constable MD PCP: Judithann Sheen MD Supervising MD: Gala Romney MD, Reuel Boom Indication 1: Aortic Valve Replacement (ICD-V43.3) Indication 2: Aortic Valve Disorder (ICD-424.1) Lab Used: [facility] Site: [facility] INR POC 4.0 INR RANGE 2.50-3.50  Dietary changes: no    Health status changes: yes       Details: Had a bad cold   Bleeding/hemorrhagic complications: no    Recent/future hospitalizations: no    Any changes in medication regimen? no    Recent/future dental: no  Any missed doses?: no       Is patient compliant with meds? yes       Allergies (verified): No Known Drug Allergies  Anticoagulation Management History:      The patient is taking warfarin and comes in today for a routine follow up visit.  Positive risk factors for bleeding include an age of 70 years or older.  The bleeding index is 'intermediate risk'.  Positive CHADS2 values include History of HTN.  Negative CHADS2 values include Age > 70 years old.  The start date was 11/06/1997.  Anticoagulation responsible provider: Bensimhon MD, Reuel Boom.  INR POC: 4.0.  Cuvette Lot#: [id number].  Exp: 10/2010.    Anticoagulation Management Assessment/Plan:      The patient's current anticoagulation dose is Warfarin sodium 5 mg tabs: Use as directed by Anticoagulation Clinic.  The target INR is 2.5-3.5.  The next INR is due 08/20/2009.  Anticoagulation instructions were given to patient.  Results were reviewed/authorized by Ysidro Evert, Pharm D Candidate.  He was notified by Ysidro Evert, Pharm D Candidate.         Prior Anticoagulation Instructions: INR 3.2  Continue on same dosage 5mg  daily.  Recheck in 4 weeks.    Current Anticoagulation Instructions: INR: 4.0 skip today's dose then resume to same dosage of 5mg  tablet daily. Recheck in 2-3 weeks

## 2010-08-02 NOTE — Medication Information (Signed)
Summary: coumadin ck/mt  Anticoagulant Therapy  Managed by: Bethena Midget, RN, BSN Referring MD: Charlies Constable MD PCP: Judithann Sheen MD Supervising MD: Daleen Squibb MD, Maisie Fus Indication 1: Aortic Valve Replacement (ICD-V43.3) Indication 2: Aortic Valve Disorder (ICD-424.1) Lab Used: [facility] Site: [facility] INR POC 3.1 INR RANGE 2.50-3.50  Dietary changes: no    Health status changes: no    Bleeding/hemorrhagic complications: no    Recent/future hospitalizations: no    Any changes in medication regimen? no    Recent/future dental: no  Any missed doses?: no       Is patient compliant with meds? yes       Allergies: No Known Drug Allergies  Anticoagulation Management History:      The patient is taking warfarin and comes in today for a routine follow up visit.  Positive risk factors for bleeding include an age of 45 years or older.  The bleeding index is 'intermediate risk'.  Positive CHADS2 values include History of HTN.  Negative CHADS2 values include Age > 27 years old.  The start date was 11/06/1997.  Anticoagulation responsible provider: Daleen Squibb MD, Maisie Fus.  INR POC: 3.1.  Cuvette Lot#: [id number].  Exp: 04/2011.    Anticoagulation Management Assessment/Plan:      The patient's current anticoagulation dose is Warfarin sodium 5 mg tabs: Use as directed by Anticoagulation Clinic.  The target INR is 2.5-3.5.  The next INR is due 02/23/2010.  Anticoagulation instructions were given to patient.  Results were reviewed/authorized by Bethena Midget, RN, BSN.  He was notified by Dillard Cannon.         Prior Anticoagulation Instructions: INR 2.6  Continue taking 1 tablet (5 mg) daily.   Return to clinic in 4 weeks.    Current Anticoagulation Instructions: INR 3.1  Continue same dose of 1 tab daily.  Re-check in 4 weeks.

## 2010-08-02 NOTE — Assessment & Plan Note (Signed)
Summary: 19yr f/u   Medications Added TRIAMTERENE-HCTZ 37.5-25 MG  TABS (TRIAMTERENE-HCTZ) take 1/2 tablet once daily AMLODIPINE BESYLATE 10 MG TABS (AMLODIPINE BESYLATE) Take one tablet by mouth daily      Allergies Added: NKDA  Visit Type:  1 yr f/u Primary Provider:  Judithann Sheen MD  CC:  edema/legs...pt c/o leg cramps says due to the Triam/hctz.....denies any cp or sob.  History of Present Illness: The patient is 69 years and return for management of his prosthetic valve and pacemaker. In 1993 he had an aortic valve replacement with a St. Jude valve for aortic stenosis. 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 He has done quite well since that time and has had no recent chest pain shortness breath or palpitations.  Current Medications (verified): 1)  Warfarin Sodium 5 Mg Tabs (Warfarin Sodium) .... Use As Directed By Anticoagulation Clinic 2)  Triamterene-Hctz 37.5-25 Mg  Tabs (Triamterene-Hctz) .... Once Daily 3)  Tylenol 325 Mg  Tabs (Acetaminophen) .... As Needed 4)  Amlodipine Besylate 5 Mg Tabs (Amlodipine Besylate) .... Take 1 1/2 Tabs By Mouth Once Daily 5)  Flomax 0.4 Mg Caps (Tamsulosin Hcl) .Marland Kitchen.. 1 Once Daily For Prevention of Nocturia 6)  Multivitamins   Tabs (Multiple Vitamin) .Marland Kitchen.. 1 Per Week 7)  Prilosec 20 Mg Cpdr (Omeprazole) .Marland Kitchen.. 1 Qd 8)  Fish Oil 1000 Mg Caps (Omega-3 Fatty Acids) .... Take 1 Capsule Daily  Allergies (verified): No Known Drug Allergies  Past History:  Past Medical History: Reviewed history from 03/18/2009 and no changes required. 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 Status post at [facility]. Jude aortic valve replacement for aortic     stenosis in 1993. 2. 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 3. Ejection fraction at  60%. 4. Hypertension. 5. Hyperlipidemia.   Review of Systems       ROS is negative except as outlined in HPI.   Vital Signs:  Patient profile:   70 year old male Height:      70 inches Weight:      186.8 pounds BMI:     26.90 Pulse rate:   52 / minute Pulse rhythm:   regular BP sitting:   148 / 80  (left arm) Cuff size:   large  Vitals Entered By: Danielle Rankin, CMA (March 23, 2010 4:33 PM)  Physical Exam  Additional Exam:  Gen. Well-nourished, in no distress   Neck: No JVD, thyroid not enlarged, no carotid bruits Lungs: No tachypnea, clear without rales, rhonchi or wheezes Cardiovascular: Rhythm regular, PMI not displaced,  normal prosthetic closure sound, no murmurs or gallops, no peripheral edema, pulses normal in all 4 extremities. Abdomen: BS normal, abdomen soft and non-tender without masses or organomegaly, no hepatosplenomegaly. MS: No deformities, no cyanosis or clubbing   Neuro:  No focal sns   Skin:  no lesions    PPM Specifications Following MD:  Everardo Beals. Juanda Chance, MD     PPM Vendor:  St Jude     PPM Model Number:  [id number]     PPM Serial Number:  [id number] PPM DOI:  08/26/1991     PPM Implanting MD:  Everardo Beals. Juanda Chance, MD  Lead 1    Location: [facility]     DOI: 08/26/1991  Model #: [id number]     Serial #: [id number]     Status: active Lead 2    Location: [facility]     DOI: 08/26/1991     Model #: [id number]     Serial #: [id number]     Status: active   Indications:  COMPLETE HEART BLOCK   PPM Follow Up Pacer Dependent:  No      Episodes Coumadin:  Yes  Parameters Mode:  DDD     Lower Rate Limit:  50     Paced AV Delay:  225     Impression & Recommendations:  Problem # 1:  AORTIC VALVE REPLACEMENT, HX OF (ICD-V43.3) A. status post valve replacement. His valve sounds are normal and his problem appears stable.  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 He is not pacer dependent. Has about a year longevity left.  Problem # 3:   ESSENTIAL HYPERTENSION, BENIGN (ICD-401.1) Hblood pressure is somewhat elevated today. He is also concerned about her diuresis after taking his diuretic. We will increase his amlodipine to 10 mg daily and decrease his combined fluid tablet to half of his current dose which would be half a tablet. His updated medication list for this problem includes:    Triamterene-hctz 37.5-25 Mg Tabs (Triamterene-hctz) .Marland Kitchen... Take 1/2 tablet once daily    Amlodipine Besylate 5 Mg Tabs (Amlodipine besylate) .Marland Kitchen... Take 1 1/2 tabs by mouth once daily  Patient Instructions: 1)  Increase amlodipine to 10mg  once daily. 2)  Decrease triamterene/hctz to 1/2 tablet daily. 3)  Your physician wants you to follow-up in: 1 year with Dr. Johney Frame.   You will receive a reminder letter in the mail two months in advance. If you don't receive a letter, please call our office to schedule the follow-up appointment. 4)  Pacemaker check in 6 months. Prescriptions: AMLODIPINE BESYLATE 10 MG TABS (AMLODIPINE BESYLATE) Take one tablet by mouth daily  #90 x 3   Entered by:   Sherri Rad, RN, BSN   Authorized by:   Lenoria Farrier, MD, [facility]   Signed by:   Sherri Rad, RN, BSN on 03/23/2010   Method used:   Print then Give to Patient   RxID:   [id number]

## 2010-08-02 NOTE — Medication Information (Signed)
Summary: Rx for Vacuum Erection system  Rx for Vacuum Erection system   Imported By: Maryln Gottron 12/23/2009 14:31:17  _____________________________________________________________________  External Attachment:    Type:   Image     Comment:   External Document

## 2010-08-04 NOTE — Medication Information (Signed)
Summary: coumadin ck/mt   Anticoagulant Therapy  Managed by: Weston Brass, PharmD Referring MD: Charlies Constable MD PCP: Judithann Sheen MD Supervising MD: Riley Kill MD, Maisie Fus Indication 1: Aortic Valve Replacement (ICD-V43.3) Indication 2: Aortic Valve Disorder (ICD-424.1) Lab Used: [facility] Site: [facility] INR POC 4.0 INR RANGE 2.50-3.50  Dietary changes: no    Health status changes: no    Bleeding/hemorrhagic complications: no    Recent/future hospitalizations: no    Any changes in medication regimen? 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 yes       Allergies: No Known Drug Allergies  Anticoagulation Management History:      The patient is taking warfarin and comes in today for a routine follow up visit.  Positive risk factors for bleeding include an age of 53 years or older.  The bleeding index is 'intermediate risk'.  Positive CHADS2 values include History of HTN.  Negative CHADS2 values include Age > 48 years old.  The start date was 11/06/1997.  Anticoagulation responsible provider: Riley Kill MD, Maisie Fus.  INR POC: 4.0.  Cuvette Lot#: [id number].  Exp: 08/2011.    Anticoagulation Management Assessment/Plan:      The patient's current anticoagulation dose is Warfarin sodium 5 mg tabs: Use as directed by Anticoagulation Clinic.  The target INR is 2.5-3.5.  The next INR is due 07/15/2010.  Anticoagulation instructions were given to patient.  Results were reviewed/authorized by Weston Brass, PharmD.  He was notified by Weston Brass PharmD.         Prior Anticoagulation Instructions: INR: 3.0  Continue Coumadin as scheduled:  1 tablet every day of the week.    Return to clinic in 4 weeks.   Current Anticoagulation Instructions: INR 4.0  Skip today's dose of Coumadin then resume same dose of 1 tablet every day.  Recheck INR in 3 weeks.

## 2010-08-04 NOTE — Progress Notes (Signed)
Summary: refill request  Phone Note Refill Request Message from:  Fax from Pharmacy on July 20, 2010 2:43 PM  Refills Requested: Medication #1:  AMLODIPINE BESYLATE 10 MG TABS Take one tablet by mouth at bedtime Initial call taken by: Kern Reap CMA Duncan Dull),  July 20, 2010 3:03 PM    Prescriptions: AMLODIPINE BESYLATE 10 MG TABS (AMLODIPINE BESYLATE) Take one tablet by mouth at bedtime  #90 x 3   Entered by:   Kern Reap CMA (AAMA)   Authorized by:   Judithann Sheen MD   Signed by:   Kern Reap CMA (AAMA) on 07/20/2010   Method used:   Electronically to        [facility]* (retail)       [address]       [place], [place]  [address]       Ph: [phone]       Fax: [phone]   RxID:   [id number]

## 2010-08-04 NOTE — Progress Notes (Signed)
Summary: refill and work in sooner appt  Phone Note Refill Request Call back at [facility] Phone [phone] Message from:  Patient---live call  Refills Requested: Medication #1:  AMLODIPINE BESYLATE 10 MG TABS Take one tablet by mouth at bedtime send to [facility] pharmacy. wants to be worked in for an appt for crtisone shot in shoulders.  Initial call taken by: Warnell Forester,  July 01, 2010 1:23 PM  Follow-up for Phone Call        rx sent.  I offered patient an appointment with a different provider and he will have his wife call back to schedule Follow-up by: Kern Reap CMA Duncan Dull),  July 01, 2010 4:13 PM    Prescriptions: AMLODIPINE BESYLATE 10 MG TABS (AMLODIPINE BESYLATE) Take one tablet by mouth at bedtime  #90 x 3   Entered by:   Kern Reap CMA (AAMA)   Authorized by:   Judithann Sheen MD   Signed by:   Kern Reap CMA (AAMA) on 07/01/2010   Method used:   Electronically to        [facility]* (retail)       [address]       [place], [place]  [address]       Ph: [phone]       Fax: [phone]   RxID:   [id number]

## 2010-08-04 NOTE — Assessment & Plan Note (Signed)
Summary: shoulder pain - rv   Vital Signs:  Patient profile:   70 year old male Weight:      193 pounds O2 Sat:      98 % on Room air Temp:     97.6 degrees F oral Pulse rate:   70 / minute Pulse rhythm:   regular BP sitting:   134 / 84  (left arm) Cuff size:   regular  Vitals Entered By: Kern Reap CMA (AAMA) (July 06, 2010 11:15 AM)  O2 Flow:  Room air CC: shoulder pain Is Patient Diabetic? No   History of Present Illness: This 70 year old white married male who has had aortic valve replacement has hypertension which is controlled His complaint today is the fact he's had pain in the left shoulder are greater in the right and had been seen previously by Dr. Raylene Miyamoto also has had this pain since September has been treated with Tylenol but has not improved. He has pain on movement pain is terrific at night her bedtime for sleep he is on warfarin so is unable to take NSAIDs Celebrex. Patient requests an injection in the right shoulder with possible FS 48. 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 Status post at [facility]. Jude aortic valve replacement for aortic     stenosis in 1993. 2. 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 3. Ejection fraction at 60%. 4. Hypertension. 5. Hyperlipidemia.   Past Surgical History: Last updated: 03/22/2010  St. Jude aortic valve replacement for aortic   stenosis in 1993.   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   Family History: Last updated: 05/05/2010 Mother and father both lived into 48s, Mom still living, both had  arthritis  Social History: Last updated: 05/05/2010 Marital Status: Married, accompanied by wife Children:  Occupation:   Review of Systems      See HPI  Physical Exam  General:  Well-developed,well-nourished,in no acute distress; alert,appropriate and cooperative throughout examination Chest Wall:  pacemaker in place anterior chest Lungs:  Normal respiratory effort, chest expands symmetrically. Lungs are clear to auscultation, no crackles or wheezes. Heart:  Normal rate and regular rhythm. S1 and S2 normal without gallop, murmur, click, rub or other extra sounds,the click from aortic aortic valve Msk:  animal tenderness of the sub-deltoid bursa on the  right Marked tenderness of the a.c. bursa on the rleft shoulder normal limitation of movement and pain on movement pain on pressure Pulses:  R and L carotid,radial,femoral,dorsalis pedis and posterior tibial pulses are full and equal bilaterally Extremities:  No clubbing, cyanosis, edema, or deformity noted with normal full range of motion of all joints.     Impression & Recommendations:  Problem # 1:  BURSITIS, ACROMIOCLAVICULAR, RIGHT (ICD-726.10) Assessment New  injected with 120 mg Depo-Medrol plus lidocaine 2 cc 1% lidocaine Prednisone 20 mg decrease in dosage Hydrocodone acetaminophen 10 650 q.4 h. p.r.n. for pain  Orders: Prescription Created Electronically [phone]) Joint Aspirate / Injection, Large (20610) Depo- Medrol 80mg  (J1040) Depo- Medrol 40mg  (J1030)  Problem # 2:  SHOULDER PAIN, BILATERAL (ICD-719.41) Assessment: Deteriorated  His  updated medication list for this problem includes:    Cyclobenzaprine Hcl 5 Mg Tabs (Cyclobenzaprine hcl) .Marland Kitchen... 1-2 tabs by mouth at bedtime as needed pain/muscle spasm    Extra Strength Acetaminophen 500 Mg Caps (Acetaminophen) .Marland Kitchen... 2 tabs by mouth two times a day and a 3rd dose as needed for severe pain    Hydrocodone-acetaminophen 10-660 Mg Tabs (Hydrocodone-acetaminophen) .Marland Kitchen...  1/2 - 1 tab q4-6h as needed  for pain  Problem # 3:  ESSENTIAL HYPERTENSION, BENIGN (ICD-401.1) Assessment: Improved  His updated medication list for this problem includes:    Triamterene-hctz 37.5-25 Mg Tabs (Triamterene-hctz) .Marland Kitchen... Take 1/2 tablet once daily in am    Amlodipine Besylate 10 Mg Tabs (Amlodipine besylate) .Marland Kitchen... Take one tablet by mouth at bedtime  Problem # 4:  ERECTILE DYSFUNCTION, NON-ORGANIC (ICD-302.72) Assessment: Improved  Problem # 5:  PACEMAKER, PERMANENT (ICD-V45.01) Assessment: Unchanged  Problem # 6:  AORTIC VALVE REPLACEMENT, HX OF (ICD-V43.3) Assessment: Improved  Complete Medication List: 1)  Warfarin Sodium 5 Mg Tabs (Warfarin sodium) .... Use as directed by anticoagulation clinic 2)  Triamterene-hctz 37.5-25 Mg Tabs (Triamterene-hctz) .... Take 1/2 tablet once daily in am 3)  Amlodipine Besylate 10 Mg Tabs (Amlodipine besylate) .... Take one tablet by mouth at bedtime 4)  Flomax 0.4 Mg Caps (Tamsulosin hcl) .Marland Kitchen.. 1 once daily for prevention of nocturia 5)  Multivitamins Tabs (Multiple vitamin) .Marland Kitchen.. 1 per week 6)  Prilosec 20 Mg Cpdr (Omeprazole) .Marland Kitchen.. 1 qd 7)  Fish Oil 1000 Mg Caps (Omega-3 fatty acids) .... Take 2 capsule daily 8)  Cyclobenzaprine Hcl 5 Mg Tabs (Cyclobenzaprine hcl) .Marland Kitchen.. 1-2 tabs by mouth at bedtime as needed pain/muscle spasm 9)  Extra Strength Acetaminophen 500 Mg Caps (Acetaminophen) .... 2 tabs by mouth two times a day and a 3rd dose as needed for severe pain 10)  Prednisone 20 Mg Tabs (Prednisone) .Marland Kitchen.. 1 tidpc for 3 days then 1 two times a day for 6 days then 1 once daily for7 days then 1/2 tab until finished for bursitis 11)  Hydrocodone-acetaminophen 10-660 Mg Tabs (Hydrocodone-acetaminophen) .... 1/2 - 1 tab q4-6h as needed  for pain  Patient Instructions: 1)  acute AC bursitis left shoulder 2)  injected with depomedrol and lidocaine 3)  to take prednisone as prescribed 4)  Hydrocodone-acetomenophen for pain as needed 5)  if  needed apply ice pack 20 minutes then apply heat 20-30  minuttes 6)  discontinue Tylenol on regular basis Prescriptions: HYDROCODONE-ACETAMINOPHEN 10-660 MG TABS (HYDROCODONE-ACETAMINOPHEN) 1/2 - 1 tab q4-6h as needed  for pain  #50 x 1   Entered and Authorized by:   Judithann Sheen MD   Signed by:   Judithann Sheen MD on 07/06/2010   Method used:   Print then Give to Patient   RxID:   [id number] PREDNISONE 20 MG TABS (PREDNISONE) 1 tidpc for 3 days then 1 two times a day for 6 days then 1 once daily for7 days then 1/2 tab until finished for bursitis  #36 x 1   Entered and Authorized by:   Judithann Sheen MD   Signed by:   Judithann Sheen MD on 07/06/2010   Method used:   Electronically to        [facility]* (retail)       [address] [place], [place]  [address]       Ph:  [phone]       Fax: [phone]   RxID:   [id number]    Orders Added: 1)  Prescription Created Electronically [G8553] 2)  Est. Patient Level IV [62952] 3)  Joint Aspirate / Injection, Large [20610] 4)  Depo- Medrol 80mg  [J1040] 5)  Depo- Medrol 40mg  [J1030]

## 2010-08-04 NOTE — Progress Notes (Signed)
Summary: Pt req to get cortizone shot in shoulder for pain  Phone Note Call from Patient Call back at Home Phone [phone]   Caller: spouse-Marti Summary of Call: Pt is needing to come in to get a cortisone shot in shoulder. Pt is in a lot of pain. Pls advise.  Initial call taken by: Lucy Antigua,  July 05, 2010 1:59 PM  Follow-up for Phone Call        spoke with wife.  pt will be here tomorrow for an appointment. Follow-up by: Kern Reap CMA Duncan Dull),  July 05, 2010 3:23 PM

## 2010-08-10 NOTE — Progress Notes (Signed)
Summary: refill request  Phone Note Refill Request Message from:  Fax from Pharmacy on August 01, 2010 4:49 PM  Refills Requested: Medication #1:  FLOMAX 0.4 MG CAPS 1 once daily for prevention of nocturia Initial call taken by: Kern Reap CMA Duncan Dull),  August 01, 2010 4:50 PM    Prescriptions: FLOMAX 0.4 MG CAPS (TAMSULOSIN HCL) 1 once daily for prevention of nocturia  #30 Each x 10   Entered by:   Kern Reap CMA (AAMA)   Authorized by:   Judithann Sheen MD   Signed by:   Kern Reap CMA (AAMA) on 08/01/2010   Method used:   Electronically to        [facility]* (retail)       [address]       [place], [place]  [address]       Ph: [phone]       Fax: [phone]   RxID:   [id number]

## 2010-08-16 DIAGNOSIS — I359 Nonrheumatic aortic valve disorder, unspecified: Secondary | ICD-10-CM

## 2010-08-16 DIAGNOSIS — Z954 Presence of other heart-valve replacement: Secondary | ICD-10-CM

## 2010-08-19 ENCOUNTER — Encounter: Payer: Self-pay | Admitting: Cardiology

## 2010-08-19 ENCOUNTER — Encounter (INDEPENDENT_AMBULATORY_CARE_PROVIDER_SITE_OTHER): Payer: Medicare Other

## 2010-08-19 DIAGNOSIS — I359 Nonrheumatic aortic valve disorder, unspecified: Secondary | ICD-10-CM

## 2010-08-19 DIAGNOSIS — Z7901 Long term (current) use of anticoagulants: Secondary | ICD-10-CM

## 2010-08-19 LAB — CONVERTED CEMR LAB: POC INR: 3.8

## 2010-08-24 NOTE — Medication Information (Signed)
Summary: rov/kh   Anticoagulant Therapy  Managed by: Tammy Sours, PharmD Referring MD: Charlies Constable MD PCP: Judithann Sheen MD Supervising MD: Daleen Squibb MD, Maisie Fus Indication 1: Aortic Valve Replacement (ICD-V43.3) Indication 2: Aortic Valve Disorder (ICD-424.1) Lab Used: [facility] Site: [facility] INR POC 3.8 INR RANGE 2.50-3.50  Dietary changes: no    Health status changes: yes       Details: pt. with stomach virus.  Bleeding/hemorrhagic complications: no    Recent/future hospitalizations: no    Any changes in medication regimen? yes       Details: pt. just came off a prednisone taper around 1 week ago, also had a cortisone shot.   Recent/future dental: no  Any missed doses?: no       Is patient compliant with meds? yes      Comments: Pt's last INR was in 12/11 and was high at 4. Patient states he has had some issues with a stomach virus and also just came off a prednisone taper, so we decided not to change dose since the patient has many factors that could attribute to high INR and ideally wanted to check back in 2-3 weeks (if high INR next time will adjust dose). * Patient stated that he could not come back until 4 weeks.*   Allergies: No Known Drug Allergies  Anticoagulation Management History:      The patient is taking warfarin and comes in today for a routine follow up visit.  Positive risk factors for bleeding include an age of 37 years or older.  The bleeding index is 'intermediate risk'.  Positive CHADS2 values include History of HTN.  Negative CHADS2 values include Age > 17 years old.  The start date was 11/06/1997.  Anticoagulation responsible provider: Daleen Squibb MD, Maisie Fus.  INR POC: 3.8.  Cuvette Lot#: [id number].  Exp: 08/2011.    Anticoagulation Management Assessment/Plan:      The patient's current anticoagulation dose is Warfarin sodium 5 mg tabs: Use as directed by Anticoagulation Clinic.  The target INR is 2.5-3.5.  The next INR is due 09/16/2010.   Anticoagulation instructions were given to patient.  Results were reviewed/authorized by Tammy Sours, PharmD.         Prior Anticoagulation Instructions: INR 4.0  Skip today's dose of Coumadin then resume same dose of 1 tablet every day.  Recheck INR in 3 weeks.   Current Anticoagulation Instructions: INR 3.8  Skip todays dose of Coumadin. Then continue 1 tablet everyday. Recheck INR in 3 weeks.

## 2010-09-14 ENCOUNTER — Encounter: Payer: Self-pay | Admitting: Internal Medicine

## 2010-09-14 DIAGNOSIS — Z954 Presence of other heart-valve replacement: Secondary | ICD-10-CM

## 2010-09-14 DIAGNOSIS — I359 Nonrheumatic aortic valve disorder, unspecified: Secondary | ICD-10-CM

## 2010-09-16 ENCOUNTER — Encounter (INDEPENDENT_AMBULATORY_CARE_PROVIDER_SITE_OTHER): Payer: Medicare Other

## 2010-09-16 ENCOUNTER — Encounter: Payer: Self-pay | Admitting: Cardiology

## 2010-09-16 ENCOUNTER — Encounter: Payer: Self-pay | Admitting: Internal Medicine

## 2010-09-16 DIAGNOSIS — Z954 Presence of other heart-valve replacement: Secondary | ICD-10-CM

## 2010-09-16 DIAGNOSIS — I359 Nonrheumatic aortic valve disorder, unspecified: Secondary | ICD-10-CM

## 2010-09-16 DIAGNOSIS — Z7901 Long term (current) use of anticoagulants: Secondary | ICD-10-CM

## 2010-09-16 LAB — CONVERTED CEMR LAB: POC INR: 2.8

## 2010-09-20 NOTE — Medication Information (Signed)
Summary: rov/tm  Anticoagulant Therapy  Managed by: Bethena Midget, RN, BSN Referring MD: Johney Frame MD, Fayrene Fearing PCP: Judithann Sheen MD Supervising MD: Daleen Squibb MD, Maisie Fus Indication 1: Aortic Valve Replacement (ICD-V43.3) Indication 2: Aortic Valve Disorder (ICD-424.1) Lab Used: [facility] Site: [facility] INR POC 2.8 INR RANGE 2.50-3.50  Dietary changes: no    Health status changes: no    Bleeding/hemorrhagic complications: no    Recent/future hospitalizations: no    Any changes in medication regimen? no    Recent/future dental: no  Any missed doses?: no       Is patient compliant with meds? yes       Allergies: No Known Drug Allergies  Anticoagulation Management History:      The patient is taking warfarin and comes in today for a routine follow up visit.  Positive risk factors for bleeding include an age of 28 years or older.  The bleeding index is 'intermediate risk'.  Positive CHADS2 values include History of HTN.  Negative CHADS2 values include Age > 29 years old.  The start date was 11/06/1997.  Anticoagulation responsible provider: Daleen Squibb MD, Maisie Fus.  INR POC: 2.8.  Cuvette Lot#: [id number].  Exp: 09/2011.    Anticoagulation Management Assessment/Plan:      The patient's current anticoagulation dose is Warfarin sodium 5 mg tabs: Use as directed by Anticoagulation Clinic.  The target INR is 2.5-3.5.  The next INR is due 10/14/2010.  Anticoagulation instructions were given to patient.  Results were reviewed/authorized by Bethena Midget, RN, BSN.  He was notified by Bethena Midget, RN, BSN.         Prior Anticoagulation Instructions: INR 3.8  Skip todays dose of Coumadin. Then continue 1 tablet everyday. Recheck INR in 3 weeks.   Current Anticoagulation Instructions: INR 2.8 Continue 1 pill everyday. Recheck in 4 weeks.

## 2010-09-29 ENCOUNTER — Telehealth: Payer: Self-pay | Admitting: Family Medicine

## 2010-09-29 NOTE — Telephone Encounter (Signed)
Pt req work in appt with Dr Scotty Court only. Pt refuses to see another dr. Pt wants to come in for chest congestion, bad cough. Pls advise.

## 2010-09-30 NOTE — Telephone Encounter (Signed)
Talked to pt and he is doing better and may not need an appointment

## 2010-10-04 ENCOUNTER — Encounter: Payer: Self-pay | Admitting: *Deleted

## 2010-10-04 ENCOUNTER — Encounter: Payer: Self-pay | Admitting: Family Medicine

## 2010-10-04 ENCOUNTER — Ambulatory Visit (INDEPENDENT_AMBULATORY_CARE_PROVIDER_SITE_OTHER)
Admission: RE | Admit: 2010-10-04 | Discharge: 2010-10-04 | Disposition: A | Discharge status: Home / Self Care | Payer: Medicare Other | Source: Ambulatory Visit | Attending: Family Medicine | Admitting: Family Medicine

## 2010-10-04 ENCOUNTER — Ambulatory Visit (INDEPENDENT_AMBULATORY_CARE_PROVIDER_SITE_OTHER): Payer: Medicare Other | Admitting: Family Medicine

## 2010-10-04 VITALS — BP 122/70 | HR 63 | Temp 97.8°F | Ht 71.5 in | Wt 189.0 lb

## 2010-10-04 DIAGNOSIS — J209 Acute bronchitis, unspecified: Secondary | ICD-10-CM

## 2010-10-04 DIAGNOSIS — I10 Essential (primary) hypertension: Secondary | ICD-10-CM

## 2010-10-04 MED ORDER — HYDROCODONE-HOMATROPINE 5-1.5 MG/5ML PO SYRP: 5.0000 mL | ORAL_SOLUTION | Freq: Four times a day (QID) | ORAL | Status: DC | PRN

## 2010-10-04 MED ORDER — LEVOFLOXACIN 750 MG PO TABS: 750.0000 mg | ORAL_TABLET | Freq: Every day | ORAL | Status: AC

## 2010-10-04 NOTE — Patient Instructions (Signed)
You have acute bronchitis will tx you with antibiotic, levaquin Continue maximum strength mucinexDM 1 each morning and cough medicine at night Stay  Well hydrated, water or other liquids Go to [facility] on [address] for your  Chest Xray

## 2010-10-09 ENCOUNTER — Encounter: Payer: Self-pay | Admitting: Family Medicine

## 2010-10-09 NOTE — Progress Notes (Signed)
  Subjective:    Patient ID: Noah Thompson, male    DOB: December 13, 1940, 70 y.o.   MRN: [id number] This 70 year old white very male today complaining of having had cough and congestion for approximately 2 weeks. He has been taking Mucinex DM and Tylenol. Patient has had aortic valve replacement as well as a permanent placement In regard to his cough he has been productive cough with yellow mucus as well as some nasal congestion or drainage he had no fever plane of right ear pain Blood pressure well controlled 122/70 he has no other complaints at this time is to review his negative at this time HPI    Review of Systems  Constitutional: Negative for fever, chills, diaphoresis and fatigue.  HENT: Positive for congestion, rhinorrhea and postnasal drip.   Eyes: Negative.   Respiratory: Positive for cough.        Productive cough of yellow mucus also known for 2 weeks  Cardiovascular: Negative for chest pain, palpitations and leg swelling.       History aortic valve replacement permanent placement for  Genitourinary: Negative.   Musculoskeletal: Negative.   Skin: Negative.   Neurological: Negative.   Hematological: Negative.   Psychiatric/Behavioral: Negative.        Objective:   Physical Exampatient is a well-built well-nourished white male who is coughing but appears to be in no distress very cooperative and pleasant HEENT revealed nasal congestion with slight yellow drainage.peallnorm some postnasal drainage pharynx is clear Chest wall reveals CABG scar as well as a pacemaker in the right upper chest Heart exam aortic click no other murmurs regular rhythm Lungs rhonchi bilaterally rales at both bases no dullness to percussion Abdomen the spleen and kidneys nonpalpable no masses normal bowel sounds Examination of extremities reveal no edema       Assessment & Plan:  Acute bronchitis treat with Levaquin plus maximum strength is symmetric DM increase  Fluid intake and stay well hydrated.  To get chest x-ray today Hypertension well controlled no change in treatment Previous problem with shoulder pain is under control

## 2010-10-10 ENCOUNTER — Telehealth: Payer: Self-pay

## 2010-10-10 NOTE — Telephone Encounter (Signed)
Pt is having trouble with a tooth and is going to the dentist on Tues.  [name] would like to know if his antibiotics will cover him until Tuesday.  Please advise

## 2010-10-10 NOTE — Telephone Encounter (Signed)
Pt aware that anitbiotics will cover for dentist appointment per Dr. Scotty Court

## 2010-10-14 ENCOUNTER — Encounter: Payer: Medicare Other | Admitting: *Deleted

## 2010-11-04 ENCOUNTER — Ambulatory Visit (INDEPENDENT_AMBULATORY_CARE_PROVIDER_SITE_OTHER): Payer: Medicare Other | Admitting: *Deleted

## 2010-11-04 ENCOUNTER — Other Ambulatory Visit: Payer: Self-pay | Admitting: *Deleted

## 2010-11-04 DIAGNOSIS — I359 Nonrheumatic aortic valve disorder, unspecified: Secondary | ICD-10-CM

## 2010-11-04 DIAGNOSIS — Z954 Presence of other heart-valve replacement: Secondary | ICD-10-CM

## 2010-11-04 MED ORDER — WARFARIN SODIUM 5 MG PO TABS: 5.0000 mg | ORAL_TABLET | ORAL | Status: DC

## 2010-11-15 NOTE — Assessment & Plan Note (Signed)
[facility]                            CARDIOLOGY OFFICE NOTE   Noah, Thompson                      MRN:          [id number]  DATE:01/31/2008                            DOB:          18-May-1941    PRIMARY CARE PHYSICIAN:  Dr. Royal Thompson, [facility] in [place].   CLINICAL HISTORY:  Noah Thompson is a 70 year old who returns for followup  management of his valvular heart disease and pacemaker.  In 1993, he had  a Paragon DDD pacemaker implanted for AV block.  That is the year where  he had aortic valve replacement with a St. Jude valve for aortic  stenosis.  He had been doing quite well since that time.  He saw mostly  by the [facility], but he is being seen here annually.  He said he has  had no chest pain, shortness of breath, or palpitations.   PAST MEDICAL HISTORY:  Significant for hypertension and hyperlipidemia.   CURRENT MEDICATIONS:  Coumadin, Prilosec, and Norvasc 10 mg, 1-1/2  tablet daily.  He had been on triamterene, but had stopped this  recently.   PHYSICAL EXAMINATION:  VITAL SIGNS:  Blood pressure was 160/84, although  he did not take his medicines today.  He says he runs about 150/90 at  home.  NECK:  There was no venous distension.  The carotid pulses were full  without bruits.  CHEST:  Clear without rales or rhonchi.  CARDIAC:  Rhythm was regular.  There is a normal aortic prosthetic  closure sound.  He has a grade 2/6 systolic ejection murmur.  SKIN:  Pacer site was well healed.  ABDOMEN:  Soft with normal bowel sounds.  There is no  hepatosplenomegaly.  EXTREMITIES:  Peripheral pulses were full with no peripheral edema.   Electrocardiogram showed a atrial tracking and ventricular pacing.  We  interrogated his pacemaker and he had good thresholds on both the atrial  and ventricular leads.  His battery voltage was 2.77 and his estimated  longevity was 3.9.   IMPRESSION:  1. Status post at [facility]. Jude aortic valve  replacement for aortic      stenosis in 1993.  2. 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  3. Ejection fraction at 60%.  4. Hypertension.  5. Hyperlipidemia.   RECOMMENDATIONS:  I think Noah Thompson is doing quite well.  His blood  pressures are somewhat elevated, so we will ask him to go back on his  triamterene.  He was recently started on a cholesterol medicine for  elevated triglycerides, although we do not have the name of that.  This  is being managed by the [place].  We will plan to continue his other  medicines and see him back in followup in a year.     Noah Elvera Lennox Juanda Chance, MD, [facility]  Electronically Signed    BRB/MedQ  DD: 01/31/2008  DT: 02/01/2008  Job #: [id number]   cc:   Noah Thompson

## 2010-11-16 ENCOUNTER — Encounter: Payer: Self-pay | Admitting: *Deleted

## 2010-11-18 NOTE — Letter (Signed)
February 04, 2008    Trial Court Administrator  Attention [place] Request  [address]  [place], [place], [address]   Payton Mccallum [phone].   Panel [id number].   RE:  Noah Thompson, Noah Thompson  MRN:  [id number]  /  DOB:  [birth year]   Gentleman:   [name] is a patient of ours.  He has valvular heart disease,  and has a previous artificial valve replacement in his aortic position  for aortic stenosis in 1993.  He also has a dual-chamber pacemaker,  which was implanted in 1993.  He also has difficulty with memory and  stress.  Due to the combination of his cardiac condition, his memory  problems, and problems dealing with stress, I think it would be best  from a medical perspective that he not serve as a juror. I would be  happy to answer  further questions regarding his medical condition.    Sincerely,      Bruce R. Juanda Chance, MD, [facility]  Electronically Signed    BRB/MedQ  DD: 02/04/2008  DT: 02/05/2008  Job #: [id number]

## 2010-11-18 NOTE — Assessment & Plan Note (Signed)
[facility]                            CARDIOLOGY OFFICE NOTE   RAHMEL, NEDVED                      MRN:          [id number]  DATE:10/23/2006                            DOB:          22-May-1941    PRIMARY CARE PHYSICIAN:  Dr. Beaulah Dinning, [facility] in [place].   MEDICAL HISTORY:  Mr. Axelson is 70 years old.  In 1993 he had a DDD  pacemaker implanted for AV block.  Later that year he had aortic valve  replacement of the same two valves for aortic stenosis.  He has been  followed mostly at the [facility] since that time, but he has his  Coumadin checks here and has been followed annually here.   He says he has been doing well.  He has had no recent chest pain,  shortness of breath, or palpitations.  He has developed an elevated  cholesterol and he has been put on Statins recently.   PAST MEDICAL HISTORY:  Significant for hyperlipidemia and hypertension.  He also has transient visual loss in eye felt to be related to  hemorrhage.   CURRENT MEDICATIONS:  Triamterene/hydrochlorothiazide.  Felodipine.  Prilosec.  Simvastatin.   ON EXAMINATION TODAY:  VITAL SIGNS:  Blood pressure 146/84.  Pulse 67  and regular.  NECK:  There was no vein distention.  Carotid pulses were full without  bruits.  CHEST:  Clear.  Cardiac rhythm is regular.  There was a normal  prosthetic closure sound.  There was a short systolic murmur at the left  sternal edge.  ABDOMEN:  Soft with normal bowel sounds.  There is no  hepatosplenomegaly.  EXTREMITIES:  The peripheral pulses were full and there was no  peripheral edema.   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  We interpreted his pacemaker and  he is not pacer dependent and he is overriding his pacemaker at rest.  He has good thresholds on both leads and his battery impedence was still  1, so he still has quite a bit of longevity on his  pacemaker.  It  appears that he is pacing very little on either chamber.   IMPRESSION:  1. Status post St. Jude aortic valve placement for aortic stenosis in      1993.  2. Status post __________ DDT pacemaker implantation in 1993 for AV      block, now stable with good pacer function, not pacer dependent.  3. Ejection fraction of 60% by last echo 2007.  4. Hypertension.   RECOMMENDATIONS:  I think Nikkolas is doing very well.  He has his labs  done at the [facility] so I will not need to repeat those.  His blood  pressure is somewhat elevated today and I asked him to check it at home  and if it is running over 140/80 more than occasionally, we will  need to adjust his medications.  Will plan to follow him on phone checks  and I will see him back in followup in a year.  Bruce Elvera Lennox Juanda Chance, MD, [facility]  Electronically Signed    BRB/MedQ  DD: 10/23/2006  DT: 10/23/2006  Job #: [id number]

## 2010-12-02 ENCOUNTER — Encounter: Payer: Medicare Other | Admitting: *Deleted

## 2010-12-21 ENCOUNTER — Telehealth: Payer: Self-pay | Admitting: Internal Medicine

## 2010-12-21 NOTE — Telephone Encounter (Signed)
Wife called and Tiffany gave her some code numbers.  She needs this printed out before insurance will take them.  Mail to patient at their address.  If you have questions, please call the patient.

## 2010-12-21 NOTE — Telephone Encounter (Signed)
Telephoned billing dept and she informed me to have pt call [employer] and they will be able to get something mailed out to the insurance co. For the patient. Thus, [facility] for pt with [employer] # [id number].

## 2010-12-29 ENCOUNTER — Other Ambulatory Visit: Payer: Self-pay | Admitting: Internal Medicine

## 2010-12-29 NOTE — Telephone Encounter (Signed)
Refill request

## 2011-02-09 ENCOUNTER — Ambulatory Visit (INDEPENDENT_AMBULATORY_CARE_PROVIDER_SITE_OTHER): Payer: Medicare Other | Admitting: *Deleted

## 2011-02-09 DIAGNOSIS — Z954 Presence of other heart-valve replacement: Secondary | ICD-10-CM

## 2011-02-09 DIAGNOSIS — I359 Nonrheumatic aortic valve disorder, unspecified: Secondary | ICD-10-CM

## 2011-02-09 LAB — POCT INR: INR: 2.7

## 2011-02-27 ENCOUNTER — Other Ambulatory Visit: Payer: Self-pay | Admitting: Internal Medicine

## 2011-03-09 ENCOUNTER — Encounter: Payer: Medicare Other | Admitting: *Deleted

## 2011-03-22 ENCOUNTER — Ambulatory Visit (INDEPENDENT_AMBULATORY_CARE_PROVIDER_SITE_OTHER): Payer: Medicare Other | Admitting: *Deleted

## 2011-03-22 DIAGNOSIS — I359 Nonrheumatic aortic valve disorder, unspecified: Secondary | ICD-10-CM

## 2011-03-22 DIAGNOSIS — Z954 Presence of other heart-valve replacement: Secondary | ICD-10-CM

## 2011-04-19 ENCOUNTER — Encounter: Payer: Medicare Other | Admitting: *Deleted

## 2011-05-03 ENCOUNTER — Ambulatory Visit (INDEPENDENT_AMBULATORY_CARE_PROVIDER_SITE_OTHER): Payer: Medicare Other | Admitting: *Deleted

## 2011-05-03 DIAGNOSIS — I359 Nonrheumatic aortic valve disorder, unspecified: Secondary | ICD-10-CM

## 2011-05-03 DIAGNOSIS — Z954 Presence of other heart-valve replacement: Secondary | ICD-10-CM

## 2011-05-31 ENCOUNTER — Ambulatory Visit (INDEPENDENT_AMBULATORY_CARE_PROVIDER_SITE_OTHER): Payer: Medicare Other | Admitting: *Deleted

## 2011-05-31 DIAGNOSIS — I359 Nonrheumatic aortic valve disorder, unspecified: Secondary | ICD-10-CM

## 2011-05-31 DIAGNOSIS — Z954 Presence of other heart-valve replacement: Secondary | ICD-10-CM

## 2011-06-16 ENCOUNTER — Encounter: Payer: Self-pay | Admitting: Internal Medicine

## 2011-06-21 ENCOUNTER — Encounter: Payer: Medicare Other | Admitting: *Deleted

## 2011-06-29 ENCOUNTER — Ambulatory Visit (INDEPENDENT_AMBULATORY_CARE_PROVIDER_SITE_OTHER): Payer: Medicare Other | Admitting: *Deleted

## 2011-06-29 DIAGNOSIS — Z954 Presence of other heart-valve replacement: Secondary | ICD-10-CM

## 2011-06-29 DIAGNOSIS — I359 Nonrheumatic aortic valve disorder, unspecified: Secondary | ICD-10-CM

## 2011-07-04 DIAGNOSIS — Z950 Presence of cardiac pacemaker: Secondary | ICD-10-CM

## 2011-07-04 HISTORY — DX: Presence of cardiac pacemaker: Z95.0

## 2011-07-06 ENCOUNTER — Other Ambulatory Visit: Payer: Self-pay | Admitting: *Deleted

## 2011-07-06 ENCOUNTER — Other Ambulatory Visit: Payer: Self-pay | Admitting: Family Medicine

## 2011-07-06 MED ORDER — WARFARIN SODIUM 5 MG PO TABS: 5.0000 mg | ORAL_TABLET | Freq: Every day | ORAL | Status: DC

## 2011-07-26 ENCOUNTER — Encounter: Payer: Medicare Other | Admitting: *Deleted

## 2011-07-27 ENCOUNTER — Encounter: Payer: Medicare Other | Admitting: *Deleted

## 2011-08-01 ENCOUNTER — Ambulatory Visit (INDEPENDENT_AMBULATORY_CARE_PROVIDER_SITE_OTHER): Payer: Medicare Other

## 2011-08-01 DIAGNOSIS — I359 Nonrheumatic aortic valve disorder, unspecified: Secondary | ICD-10-CM

## 2011-08-01 DIAGNOSIS — Z954 Presence of other heart-valve replacement: Secondary | ICD-10-CM

## 2011-08-15 ENCOUNTER — Telehealth: Payer: Self-pay | Admitting: *Deleted

## 2011-08-15 NOTE — Telephone Encounter (Signed)
06-16-11 SENT PAST DUE LETTER/MT 08-15-11 called pt and set up pacer ck w/pt's wife 09-06-11/mt

## 2011-08-29 ENCOUNTER — Ambulatory Visit (INDEPENDENT_AMBULATORY_CARE_PROVIDER_SITE_OTHER): Payer: Medicare Other | Admitting: *Deleted

## 2011-08-29 DIAGNOSIS — I359 Nonrheumatic aortic valve disorder, unspecified: Secondary | ICD-10-CM

## 2011-08-29 DIAGNOSIS — Z954 Presence of other heart-valve replacement: Secondary | ICD-10-CM

## 2011-08-29 LAB — POCT INR: INR: 2.7

## 2011-09-06 ENCOUNTER — Encounter: Payer: Self-pay | Admitting: Internal Medicine

## 2011-09-06 ENCOUNTER — Ambulatory Visit (INDEPENDENT_AMBULATORY_CARE_PROVIDER_SITE_OTHER): Payer: Medicare Other | Admitting: *Deleted

## 2011-09-06 DIAGNOSIS — I498 Other specified cardiac arrhythmias: Secondary | ICD-10-CM

## 2011-09-06 LAB — PACEMAKER DEVICE OBSERVATION
DEVICE MODEL PM: 50807
RV LEAD THRESHOLD: 1 V

## 2011-09-06 NOTE — Progress Notes (Signed)
PPM check 

## 2011-09-13 ENCOUNTER — Telehealth: Payer: Self-pay

## 2011-09-13 NOTE — Telephone Encounter (Signed)
-   Flomax 0.4 mg 

## 2011-09-13 NOTE — Telephone Encounter (Signed)
Refill once OK and make sure pt aware he must re-establish prior to further refills.

## 2011-09-13 NOTE — Telephone Encounter (Signed)
What is he requesting refill on?

## 2011-09-13 NOTE — Telephone Encounter (Signed)
Pt has not established with new pcp.  Pt last seen 10/04/10.  Pls advise.

## 2011-09-14 MED ORDER — TAMSULOSIN HCL 0.4 MG PO CAPS: 0.4000 mg | ORAL_CAPSULE | Freq: Every day | ORAL | Status: DC

## 2011-09-14 NOTE — Telephone Encounter (Signed)
Left a message for pt to return call. Rx sent to pharmacy.  

## 2011-10-10 ENCOUNTER — Ambulatory Visit (INDEPENDENT_AMBULATORY_CARE_PROVIDER_SITE_OTHER): Payer: Medicare Other | Admitting: *Deleted

## 2011-10-10 DIAGNOSIS — I359 Nonrheumatic aortic valve disorder, unspecified: Secondary | ICD-10-CM

## 2011-10-10 DIAGNOSIS — Z954 Presence of other heart-valve replacement: Secondary | ICD-10-CM

## 2011-10-10 LAB — POCT INR: INR: 2.2

## 2011-10-23 ENCOUNTER — Telehealth: Payer: Self-pay | Admitting: Family Medicine

## 2011-10-23 MED ORDER — AMLODIPINE BESYLATE 10 MG PO TABS: 10.0000 mg | ORAL_TABLET | Freq: Every day | ORAL | Status: DC

## 2011-10-23 MED ORDER — TAMSULOSIN HCL 0.4 MG PO CAPS: 0.4000 mg | ORAL_CAPSULE | Freq: Every day | ORAL | Status: DC

## 2011-10-23 NOTE — Telephone Encounter (Signed)
Pt has appt to est with Dr Sharen Hones on 10/31/11 at 1:45pm, but pt is going to need to get refill of Tamsulosin HCl (FLOMAX) 0.4 MG CAPS and amLODipine (NORVASC) 10 MG tablet to [facility] in [place], [place], to last until appt date.

## 2011-10-31 ENCOUNTER — Ambulatory Visit (INDEPENDENT_AMBULATORY_CARE_PROVIDER_SITE_OTHER): Payer: Medicare Other | Admitting: Family Medicine

## 2011-10-31 ENCOUNTER — Encounter: Payer: Self-pay | Admitting: Family Medicine

## 2011-10-31 VITALS — BP 140/78 | HR 60 | Temp 97.7°F | Ht 71.5 in | Wt 189.8 lb

## 2011-10-31 DIAGNOSIS — Z954 Presence of other heart-valve replacement: Secondary | ICD-10-CM

## 2011-10-31 DIAGNOSIS — I10 Essential (primary) hypertension: Secondary | ICD-10-CM

## 2011-10-31 DIAGNOSIS — Z950 Presence of cardiac pacemaker: Secondary | ICD-10-CM

## 2011-10-31 DIAGNOSIS — E785 Hyperlipidemia, unspecified: Secondary | ICD-10-CM

## 2011-10-31 NOTE — Patient Instructions (Signed)
Take mucinex with plenty of fluid. Get copy of immunization records when you next go to [place]. Return at your convenience fasting for bloodwork, afterwards for medicare wellness check.

## 2011-10-31 NOTE — Assessment & Plan Note (Signed)
check FLP when returns fasting for blood work.

## 2011-10-31 NOTE — Assessment & Plan Note (Signed)
On coumadin, states INR always normal.  Followed by coumadin clinic.

## 2011-10-31 NOTE — Assessment & Plan Note (Signed)
Compliant on norvasc.  Doesn't regularly take maxzide. BP Readings from Last 3 Encounters:  10/31/11 140/78  10/04/10 122/70  07/06/10 134/84

## 2011-10-31 NOTE — Progress Notes (Signed)
Subjective:    Patient ID: Noah Thompson, male    DOB: [birth year], 71 y.o.   MRN: [id number]  HPI CC: transfer from [facility]  Transfer of care from Dr. Scotty Court.  Getting over bronchitis.  Taking mucinex DM.  H/o PNA in past.  Did receive PNA shot but unsure when.  H/o pacemaker for AV block. S/p AVR done several years back for AS, on chronic coumadin.  No bleeding or bruising.  No SOB or CP/tightness.  Last INR was normal according to patient.  HLD - off meds  HTN - slightly elevated today.  Not taking 1/2 maxzide dialy.  H/o nocturia but denies BPH.  On flomax and well controlled.  Does go to [place] for physicals.  Preventative: Last CPE was 2010. States has had tetanus and pneumonia shots  Lives with wife, 5 dogs.  grown children away. Occupation: retired, was Teaching laboratory technician Activity: welding  Medications and allergies reviewed and updated in chart.  Past histories reviewed and updated if relevant as below. Patient Active Problem List  Diagnoses  . HYPOTHYROIDISM  . HYPERLIPIDEMIA  . ERECTILE DYSFUNCTION, NON-ORGANIC  . DEPRESSION  . ESSENTIAL HYPERTENSION, BENIGN  . HYPERTENSION  . ARTHRITIS  . SHOULDER PAIN, BILATERAL  . EDEMA  . NOCTURIA  . AORTIC VALVE REPLACEMENT, HX OF  . PACEMAKER, PERMANENT  . BURSITIS, ACROMIOCLAVICULAR, RIGHT  . Aortic valve disorder   Past Medical History  Diagnosis Date  . Hyperlipidemia   . Hypertension   . Depression   . Heart disease   . Heart murmur   . Kidney disease   . S/P aortic valve replacement   . Pacemaker     dual-mode, dual-sensing, dual-pacing  . Ejection fraction < 50%     at 60%   Past Surgical History  Procedure Date  . Cardiac valve replacement   . Pacemaker insertion    History  Substance Use Topics  . Smoking status: Never Smoker   . Smokeless tobacco: Never Used  . Alcohol Use: No   Family History  Problem Relation Age of Onset  . Arthritis Mother   . Arthritis Father    No Known  Allergies Current Outpatient Prescriptions on File Prior to Visit  Medication Sig Dispense Refill  . Acetaminophen (EXTRA STRENGTH ACETAMINOPHEN) 500 MG coapsule Take 2 capsules by mouth 2 (two) times daily.        Marland Kitchen amLODipine (NORVASC) 10 MG tablet Take 1 tablet (10 mg total) by mouth at bedtime.  30 tablet  1  . Dextromethorphan-Guaifenesin (MUCINEX DM PO) Take by mouth.        . fish oil-omega-3 fatty acids 1000 MG capsule Take 2 g by mouth daily.        Marland Kitchen omeprazole (PRILOSEC) 20 MG capsule Take 20 mg by mouth daily as needed.       . Tamsulosin HCl (FLOMAX) 0.4 MG CAPS Take 1 capsule (0.4 mg total) by mouth daily.  30 capsule  1  . warfarin (COUMADIN) 5 MG tablet Take 1 tablet (5 mg total) by mouth daily.  60 tablet  2  . Multiple Vitamin (MULTIVITAMIN) capsule Take 1 capsule by mouth daily.        Marland Kitchen triamterene-hydrochlorothiazide (DYAZIDE) 37.5-25 MG per capsule Take by mouth. Take 1/2 tablet once daily in the am            Review of Systems Per HPI    Objective:   Physical Exam  Nursing note and vitals reviewed. Constitutional: He is  oriented to person, place, and time. He appears well-developed and well-nourished. No distress.  HENT:  Head: Normocephalic and atraumatic.  Right Ear: External ear normal.  Left Ear: External ear normal.  Nose: Nose normal.  Mouth/Throat: Oropharynx is clear and moist.  Eyes: Conjunctivae and EOM are normal. Pupils are equal, round, and reactive to light.  Neck: Normal range of motion. Neck supple. Carotid bruit is not present.  Cardiovascular: Normal rate, regular rhythm and intact distal pulses.   Murmur (3/6 SEM) heard. Pulses:      Radial pulses are 2+ on the right side, and 2+ on the left side.  Pulmonary/Chest: Effort normal and breath sounds normal. No respiratory distress. He has no wheezes. He has no rales.  Abdominal: Soft. Bowel sounds are normal. He exhibits no distension and no mass. There is no tenderness. There is no rebound  and no guarding.  Musculoskeletal: Normal range of motion.  Lymphadenopathy:    He has no cervical adenopathy.  Neurological: He is alert and oriented to person, place, and time.       CN grossly intact, station and gait intact  Skin: Skin is warm and dry. No rash noted.  Psychiatric: He has a normal mood and affect. His behavior is normal. Judgment and thought content normal.       Assessment & Plan:

## 2011-11-07 ENCOUNTER — Ambulatory Visit (INDEPENDENT_AMBULATORY_CARE_PROVIDER_SITE_OTHER): Payer: Medicare Other | Admitting: Pharmacist

## 2011-11-07 DIAGNOSIS — I359 Nonrheumatic aortic valve disorder, unspecified: Secondary | ICD-10-CM

## 2011-11-07 DIAGNOSIS — Z954 Presence of other heart-valve replacement: Secondary | ICD-10-CM

## 2011-11-07 LAB — POCT INR: INR: 3.2

## 2011-11-15 ENCOUNTER — Encounter: Payer: Medicare Other | Admitting: Internal Medicine

## 2011-12-13 ENCOUNTER — Other Ambulatory Visit: Payer: Self-pay | Admitting: Family Medicine

## 2011-12-13 DIAGNOSIS — Z125 Encounter for screening for malignant neoplasm of prostate: Secondary | ICD-10-CM

## 2011-12-13 DIAGNOSIS — E785 Hyperlipidemia, unspecified: Secondary | ICD-10-CM

## 2011-12-13 DIAGNOSIS — I10 Essential (primary) hypertension: Secondary | ICD-10-CM

## 2011-12-14 ENCOUNTER — Other Ambulatory Visit (INDEPENDENT_AMBULATORY_CARE_PROVIDER_SITE_OTHER): Payer: Medicare Other

## 2011-12-14 DIAGNOSIS — Z125 Encounter for screening for malignant neoplasm of prostate: Secondary | ICD-10-CM

## 2011-12-14 DIAGNOSIS — E785 Hyperlipidemia, unspecified: Secondary | ICD-10-CM

## 2011-12-14 DIAGNOSIS — I10 Essential (primary) hypertension: Secondary | ICD-10-CM

## 2011-12-14 LAB — COMPREHENSIVE METABOLIC PANEL
AST: 12 U/L (ref 0–37)
Alkaline Phosphatase: 71 U/L (ref 39–117)
BUN: 19 mg/dL (ref 6–23)
Creatinine, Ser: 1 mg/dL (ref 0.4–1.5)

## 2011-12-14 LAB — LIPID PANEL
HDL: 45.5 mg/dL (ref >39.00)
Total CHOL/HDL Ratio: 5
Triglycerides: 143 mg/dL (ref 0.0–149.0)
VLDL: 28.6 mg/dL (ref 0.0–40.0)

## 2011-12-14 LAB — LDL CHOLESTEROL, DIRECT: Direct LDL: 140.7 mg/dL

## 2011-12-26 ENCOUNTER — Telehealth: Payer: Self-pay | Admitting: Internal Medicine

## 2011-12-26 ENCOUNTER — Other Ambulatory Visit: Payer: Self-pay

## 2011-12-26 MED ORDER — AMLODIPINE BESYLATE 10 MG PO TABS: 10.0000 mg | ORAL_TABLET | Freq: Every day | ORAL | Status: DC

## 2011-12-26 NOTE — Telephone Encounter (Signed)
pts wife request amlodipine refill # 90 x 0 to [facility]. She will call back to schedule appt prior to med finished.

## 2011-12-26 NOTE — Telephone Encounter (Signed)
Needs coumadin refill called into [facility]

## 2011-12-27 ENCOUNTER — Other Ambulatory Visit: Payer: Self-pay | Admitting: Internal Medicine

## 2011-12-27 MED ORDER — WARFARIN SODIUM 5 MG PO TABS: 5.0000 mg | ORAL_TABLET | Freq: Every day | ORAL | Status: DC

## 2012-01-08 ENCOUNTER — Ambulatory Visit (INDEPENDENT_AMBULATORY_CARE_PROVIDER_SITE_OTHER): Payer: Medicare Other | Admitting: Internal Medicine

## 2012-01-08 ENCOUNTER — Ambulatory Visit (INDEPENDENT_AMBULATORY_CARE_PROVIDER_SITE_OTHER): Payer: Medicare Other | Admitting: *Deleted

## 2012-01-08 ENCOUNTER — Encounter: Payer: Self-pay | Admitting: Internal Medicine

## 2012-01-08 VITALS — BP 152/80 | HR 61 | Resp 18 | Ht 71.0 in | Wt 186.0 lb

## 2012-01-08 DIAGNOSIS — I441 Atrioventricular block, second degree: Secondary | ICD-10-CM

## 2012-01-08 DIAGNOSIS — I359 Nonrheumatic aortic valve disorder, unspecified: Secondary | ICD-10-CM

## 2012-01-08 DIAGNOSIS — Z954 Presence of other heart-valve replacement: Secondary | ICD-10-CM

## 2012-01-08 DIAGNOSIS — I10 Essential (primary) hypertension: Secondary | ICD-10-CM

## 2012-01-08 LAB — PACEMAKER DEVICE OBSERVATION
AL IMPEDENCE PM: 519 Ohm
RV LEAD IMPEDENCE PM: 393 Ohm

## 2012-01-08 NOTE — Assessment & Plan Note (Signed)
Stable No change required today  

## 2012-01-08 NOTE — Progress Notes (Signed)
 Javier Gutierrez, MD:  Noah Thompson is a 71 y.o. male with a h/o AV block sp PPM (SJM) by Dr Brodie who presents today to establish care in the Electrophysiology device clinic.  He underwent AVR (mechanical) in 1993 for AS.  He developed perioperative AV block and therefore had a PPM implanted by Dr Brodie.  He has done well since that time.  He is not pacemaker dependant. The patient reports doing very well since having a pacemaker implanted and remains very active despite his age.   Today, he  denies symptoms of palpitations, chest pain, shortness of breath, orthopnea, PND, lower extremity edema, dizziness, presyncope, syncope, or neurologic sequela.  The patientis tolerating medications without difficulties and is otherwise without complaint today.   Past Medical History  Diagnosis Date  . Hyperlipidemia   . Hypertension   . Depression   . Heart disease     s/p pacer  . Kidney disease     h/o nephritis as young adult  . S/P aortic valve replacement 1993    for aortic stenosis  . Pacemaker 1993    for AV block   Past Surgical History  Procedure Date  . Aortic valve replacement 1993    St. Jude Mechanical Valve  . Pacemaker insertion 1993    for AV block, not pacemaker dependant  . Anal fissure repair 1970s    History   Social History  . Marital Status: Married    Spouse Name: N/A    Number of Children: N/A  . Years of Education: N/A   Occupational History  . Not on file.   Social History Main Topics  . Smoking status: Never Smoker   . Smokeless tobacco: Never Used  . Alcohol Use: No  . Drug Use: No  . Sexually Active: Not on file   Other Topics Concern  . Not on file   Social History Narrative   Lives with wife, 5 dogs.  grown children away.Occupation: retired, was car mechanicActivity: welding    Family History  Problem Relation Age of Onset  . Arthritis Mother   . Arthritis Father   . Heart disease Father     CHF  . Coronary artery disease Neg Hx    . Stroke Neg Hx   . Diabetes Neg Hx   . Cancer Father 90    prostate    No Known Allergies  Current Outpatient Prescriptions  Medication Sig Dispense Refill  . amLODipine (NORVASC) 10 MG tablet Take 1 tablet (10 mg total) by mouth at bedtime.  90 tablet  0  . fish oil-omega-3 fatty acids 1000 MG capsule Take 2 g by mouth daily.        . Multiple Vitamin (MULTIVITAMIN) capsule Take 1 capsule by mouth daily.        . omeprazole (PRILOSEC) 20 MG capsule Take 20 mg by mouth daily as needed.       . Tamsulosin HCl (FLOMAX) 0.4 MG CAPS Take 1 capsule (0.4 mg total) by mouth daily.  30 capsule  1  . triamterene-hydrochlorothiazide (DYAZIDE) 37.5-25 MG per capsule Take by mouth. Take 1/2 tablet once daily in the am         . warfarin (COUMADIN) 5 MG tablet Take 1 tablet (5 mg total) by mouth daily.  60 tablet  0  . Acetaminophen (EXTRA STRENGTH ACETAMINOPHEN) 500 MG coapsule Take 2 capsules by mouth 2 (two) times daily.        . Dextromethorphan-Guaifenesin (MUCINEX DM PO)   Take by mouth.          ROS- all systems are reviewed and negative except as per HPI  Physical Exam: Filed Vitals:   01/08/12 1223  BP: 152/80  Pulse: 61  Resp: 18  Height: 5' 11" (1.803 m)  Weight: 186 lb (84.369 kg)  SpO2: 91%    GEN- The patient is well appearing, alert and oriented x 3 today.   Head- normocephalic, atraumatic Eyes-  Sclera clear, conjunctiva pink Ears- hearing intact Oropharynx- clear Neck- supple, no JVP Lymph- no cervical lymphadenopathy Lungs- Clear to ausculation bilaterally, normal work of breathing Chest- R sided pacemaker pocket is well healed Heart- Regular rate and rhythm, mechanical A2 GI- soft, NT, ND, + BS Extremities- no clubbing, cyanosis, or edema MS- no significant deformity or atrophy Skin- no rash or lesion Psych- euthymic mood, full affect Neuro- strength and sensation are intact  Pacemaker interrogation- reviewed in detail today,  See PACEART  report  Assessment and Plan:   

## 2012-01-08 NOTE — Assessment & Plan Note (Signed)
The patient is s/p PPM 1993.  Interrogation of the device today reveals that he is at [facility] battery status. Risks ,benefits, and alternatives to pacemaker pulse generator replacement were discussed at length with the patient today who wishes to proceed.  We will schedule generator change at the next available time.

## 2012-01-08 NOTE — Assessment & Plan Note (Signed)
Continue long term anticoagulation with coumadin We will need to assess INR prior to generator change.

## 2012-01-08 NOTE — Patient Instructions (Addendum)
You are tentatively scheduled for a pacemaker/generator change out on July 25th.  We will contact you in the next week with all the details and arrangements.    You need to come in for a INR check in the coumadin clininc on July 24th.

## 2012-01-10 ENCOUNTER — Encounter: Payer: Self-pay | Admitting: *Deleted

## 2012-01-10 ENCOUNTER — Other Ambulatory Visit: Payer: Self-pay | Admitting: *Deleted

## 2012-01-10 DIAGNOSIS — I10 Essential (primary) hypertension: Secondary | ICD-10-CM

## 2012-01-10 DIAGNOSIS — N529 Male erectile dysfunction, unspecified: Secondary | ICD-10-CM

## 2012-01-12 ENCOUNTER — Encounter (HOSPITAL_COMMUNITY): Payer: Self-pay | Admitting: Pharmacy Technician

## 2012-01-15 ENCOUNTER — Encounter (HOSPITAL_COMMUNITY): Payer: Self-pay | Admitting: Pharmacy Technician

## 2012-01-18 ENCOUNTER — Other Ambulatory Visit (INDEPENDENT_AMBULATORY_CARE_PROVIDER_SITE_OTHER): Payer: Medicare Other

## 2012-01-18 DIAGNOSIS — Z7901 Long term (current) use of anticoagulants: Secondary | ICD-10-CM

## 2012-01-18 DIAGNOSIS — I10 Essential (primary) hypertension: Secondary | ICD-10-CM

## 2012-01-18 DIAGNOSIS — N529 Male erectile dysfunction, unspecified: Secondary | ICD-10-CM

## 2012-01-19 LAB — BASIC METABOLIC PANEL
BUN: 18 mg/dL (ref 6–23)
CO2: 27 mEq/L (ref 19–32)
Chloride: 105 mEq/L (ref 96–112)
Creatinine, Ser: 1.1 mg/dL (ref 0.4–1.5)
Glucose, Bld: 87 mg/dL (ref 70–99)
Potassium: 4.3 mEq/L (ref 3.5–5.1)

## 2012-01-19 LAB — CBC WITH DIFFERENTIAL/PLATELET
Eosinophils Relative: 7.3 % — ABNORMAL HIGH (ref 0.0–5.0)
HCT: 40.4 % (ref 39.0–52.0)
Lymphs Abs: 2.6 10*3/uL (ref 0.7–4.0)
MCHC: 33.8 g/dL (ref 30.0–36.0)
MCV: 87.5 fl (ref 78.0–100.0)
Monocytes Absolute: 0.7 10*3/uL (ref 0.1–1.0)
Neutrophils Relative %: 51.7 % (ref 43.0–77.0)
Platelets: 271 10*3/uL (ref 150.0–400.0)
RDW: 14 % (ref 11.5–14.6)

## 2012-01-22 ENCOUNTER — Other Ambulatory Visit: Payer: Self-pay | Admitting: *Deleted

## 2012-01-22 DIAGNOSIS — I441 Atrioventricular block, second degree: Secondary | ICD-10-CM

## 2012-01-24 ENCOUNTER — Ambulatory Visit (INDEPENDENT_AMBULATORY_CARE_PROVIDER_SITE_OTHER): Payer: Medicare Other | Admitting: *Deleted

## 2012-01-24 DIAGNOSIS — I359 Nonrheumatic aortic valve disorder, unspecified: Secondary | ICD-10-CM

## 2012-01-24 DIAGNOSIS — Z954 Presence of other heart-valve replacement: Secondary | ICD-10-CM

## 2012-01-24 MED ORDER — CEFAZOLIN SODIUM-DEXTROSE 2-3 GM-% IV SOLR
2.0000 g | INTRAVENOUS | Status: AC
  Filled 2012-01-24 (×2): qty 50

## 2012-01-24 MED ORDER — CHLORHEXIDINE GLUCONATE 4 % EX LIQD
60.0000 mL | Freq: Once | CUTANEOUS | Status: DC
  Filled 2012-01-24: qty 60

## 2012-01-24 MED ORDER — SODIUM CHLORIDE 0.9 % IJ SOLN: 3.0000 mL | Freq: Two times a day (BID) | INTRAMUSCULAR | Status: DC

## 2012-01-24 MED ORDER — SODIUM CHLORIDE 0.9 % IV SOLN
250.0000 mL | INTRAVENOUS | Status: DC
  Administered 2012-01-25: 1000 mL via INTRAVENOUS

## 2012-01-24 MED ORDER — SODIUM CHLORIDE 0.45 % IV SOLN
INTRAVENOUS | Status: DC
  Administered 2012-01-25: 09:00:00 via INTRAVENOUS

## 2012-01-24 MED ORDER — SODIUM CHLORIDE 0.9 % IJ SOLN: 3.0000 mL | INTRAMUSCULAR | Status: DC | PRN

## 2012-01-24 MED ORDER — SODIUM CHLORIDE 0.9 % IR SOLN
80.0000 mg | Status: AC
  Filled 2012-01-24: qty 2

## 2012-01-25 ENCOUNTER — Ambulatory Visit (HOSPITAL_COMMUNITY)
Admission: RE | Admit: 2012-01-25 | Discharge: 2012-01-25 | Disposition: A | Discharge status: Home / Self Care | Payer: Medicare Other | Source: Ambulatory Visit | Attending: Internal Medicine | Admitting: Internal Medicine

## 2012-01-25 ENCOUNTER — Encounter (HOSPITAL_COMMUNITY)
Admission: RE | Disposition: A | Discharge status: Home / Self Care | Payer: Self-pay | Source: Ambulatory Visit | Attending: Internal Medicine

## 2012-01-25 ENCOUNTER — Ambulatory Visit (INDEPENDENT_AMBULATORY_CARE_PROVIDER_SITE_OTHER): Payer: Medicare Other | Admitting: Pharmacist

## 2012-01-25 DIAGNOSIS — Z954 Presence of other heart-valve replacement: Secondary | ICD-10-CM

## 2012-01-25 DIAGNOSIS — I10 Essential (primary) hypertension: Secondary | ICD-10-CM | POA: Insufficient documentation

## 2012-01-25 DIAGNOSIS — Z45018 Encounter for adjustment and management of other part of cardiac pacemaker: Secondary | ICD-10-CM | POA: Insufficient documentation

## 2012-01-25 DIAGNOSIS — E785 Hyperlipidemia, unspecified: Secondary | ICD-10-CM | POA: Insufficient documentation

## 2012-01-25 DIAGNOSIS — Z950 Presence of cardiac pacemaker: Secondary | ICD-10-CM | POA: Diagnosis present

## 2012-01-25 DIAGNOSIS — I441 Atrioventricular block, second degree: Secondary | ICD-10-CM | POA: Insufficient documentation

## 2012-01-25 DIAGNOSIS — Z952 Presence of prosthetic heart valve: Secondary | ICD-10-CM

## 2012-01-25 DIAGNOSIS — I359 Nonrheumatic aortic valve disorder, unspecified: Secondary | ICD-10-CM

## 2012-01-25 HISTORY — PX: PERMANENT PACEMAKER GENERATOR CHANGE: SHX6022

## 2012-01-25 LAB — POCT INR: INR: 2.5

## 2012-01-25 LAB — PROTIME-INR
INR: 2.3 — ABNORMAL HIGH (ref 0.00–1.49)
Prothrombin Time: 25.7 seconds — ABNORMAL HIGH (ref 11.6–15.2)

## 2012-01-25 SURGERY — PERMANENT PACEMAKER GENERATOR CHANGE
Anesthesia: LOCAL

## 2012-01-25 MED ORDER — SODIUM CHLORIDE 0.9 % IV SOLN: 250.0000 mL | INTRAVENOUS | Status: DC | PRN

## 2012-01-25 MED ORDER — ACETAMINOPHEN 325 MG PO TABS: 325.0000 mg | ORAL_TABLET | ORAL | Status: DC | PRN

## 2012-01-25 MED ORDER — LIDOCAINE HCL (PF) 1 % IJ SOLN
INTRAMUSCULAR | Status: AC
  Filled 2012-01-25: qty 60

## 2012-01-25 MED ORDER — HYDROCODONE-ACETAMINOPHEN 5-325 MG PO TABS: 1.0000 | ORAL_TABLET | ORAL | Status: DC | PRN

## 2012-01-25 MED ORDER — ONDANSETRON HCL 4 MG/2ML IJ SOLN: 4.0000 mg | Freq: Four times a day (QID) | INTRAMUSCULAR | Status: DC | PRN

## 2012-01-25 MED ORDER — SODIUM CHLORIDE 0.9 % IJ SOLN: 3.0000 mL | INTRAMUSCULAR | Status: DC | PRN

## 2012-01-25 MED ORDER — MUPIROCIN 2 % EX OINT
TOPICAL_OINTMENT | Freq: Two times a day (BID) | CUTANEOUS | Status: DC
  Administered 2012-01-25: 1 via NASAL
  Filled 2012-01-25: qty 22

## 2012-01-25 MED ORDER — SODIUM CHLORIDE 0.9 % IJ SOLN: 3.0000 mL | Freq: Two times a day (BID) | INTRAMUSCULAR | Status: DC

## 2012-01-25 NOTE — Interval H&P Note (Signed)
History and Physical Interval Note:  01/25/2012 9:54 AM  Noah Thompson  has presented today for surgery, with the diagnosis of End of life  The various methods of treatment have been discussed with the patient and family. After consideration of risks, benefits and other options for treatment, the patient has consented to  Procedure(s) (LRB): PERMANENT PACEMAKER GENERATOR CHANGE (N/A) as a surgical intervention .  The patient's history has been reviewed, patient examined, no change in status, stable for surgery.  I have reviewed the patient's chart and labs.  Questions were answered to the patient's satisfaction.     [name]

## 2012-01-25 NOTE — H&P (View-Only) (Signed)
Noah Boyden, MD:  Noah Thompson is a 71 y.o. male with a h/o AV block sp PPM (SJM) by Dr Juanda Chance who presents today to establish care in the Electrophysiology device clinic.  He underwent AVR (mechanical) in 1993 for AS.  He developed perioperative AV block and therefore had a PPM implanted by Dr Juanda Chance.  He has done well since that time.  He is not pacemaker dependant. The patient reports doing very well since having a pacemaker implanted and remains very active despite his age.   Today, he  denies symptoms of palpitations, chest pain, shortness of breath, orthopnea, PND, lower extremity edema, dizziness, presyncope, syncope, or neurologic sequela.  The patientis tolerating medications without difficulties and is otherwise without complaint today.   Past Medical History  Diagnosis Date  . Hyperlipidemia   . Hypertension   . Depression   . Heart disease     s/p pacer  . Kidney disease     h/o nephritis as young adult  . S/P aortic valve replacement 1993    for aortic stenosis  . Pacemaker 1993    for AV block   Past Surgical History  Procedure Date  . Aortic valve replacement 1993    St. Jude Mechanical Valve  . Pacemaker insertion 1993    for AV block, not pacemaker dependant  . Anal fissure repair 1970s    History   Social History  . Marital Status: Married    Spouse Name: N/A    Number of Children: N/A  . Years of Education: N/A   Occupational History  . Not on file.   Social History Main Topics  . Smoking status: Never Smoker   . Smokeless tobacco: Never Used  . Alcohol Use: No  . Drug Use: No  . Sexually Active: Not on file   Other Topics Concern  . Not on file   Social History Narrative   Lives with wife, 5 dogs.  grown children away.Occupation: retired, was Nurse, mental health: welding    Family History  Problem Relation Age of Onset  . Arthritis Mother   . Arthritis Father   . Heart disease Father     CHF  . Coronary artery disease Neg Hx    . Stroke Neg Hx   . Diabetes Neg Hx   . Cancer Father 15    prostate    No Known Allergies  Current Outpatient Prescriptions  Medication Sig Dispense Refill  . amLODipine (NORVASC) 10 MG tablet Take 1 tablet (10 mg total) by mouth at bedtime.  90 tablet  0  . fish oil-omega-3 fatty acids 1000 MG capsule Take 2 g by mouth daily.        . Multiple Vitamin (MULTIVITAMIN) capsule Take 1 capsule by mouth daily.        Marland Kitchen omeprazole (PRILOSEC) 20 MG capsule Take 20 mg by mouth daily as needed.       . Tamsulosin HCl (FLOMAX) 0.4 MG CAPS Take 1 capsule (0.4 mg total) by mouth daily.  30 capsule  1  . triamterene-hydrochlorothiazide (DYAZIDE) 37.5-25 MG per capsule Take by mouth. Take 1/2 tablet once daily in the am         . warfarin (COUMADIN) 5 MG tablet Take 1 tablet (5 mg total) by mouth daily.  60 tablet  0  . Acetaminophen (EXTRA STRENGTH ACETAMINOPHEN) 500 MG coapsule Take 2 capsules by mouth 2 (two) times daily.        Marland Kitchen Dextromethorphan-Guaifenesin (MUCINEX DM PO)  Take by mouth.          ROS- all systems are reviewed and negative except as per HPI  Physical Exam: Filed Vitals:   01/08/12 1223  BP: 152/80  Pulse: 61  Resp: 18  Height: 5\' 11"  (1.803 m)  Weight: 186 lb (84.369 kg)  SpO2: 91%    GEN- The patient is well appearing, alert and oriented x 3 today.   Head- normocephalic, atraumatic Eyes-  Sclera clear, conjunctiva pink Ears- hearing intact Oropharynx- clear Neck- supple, no JVP Lymph- no cervical lymphadenopathy Lungs- Clear to ausculation bilaterally, normal work of breathing Chest- R sided pacemaker pocket is well healed Heart- Regular rate and rhythm, mechanical A2 GI- soft, NT, ND, + BS Extremities- no clubbing, cyanosis, or edema MS- no significant deformity or atrophy Skin- no rash or lesion Psych- euthymic mood, full affect Neuro- strength and sensation are intact  Pacemaker interrogation- reviewed in detail today,  See PACEART  report  Assessment and Plan:

## 2012-01-25 NOTE — Op Note (Signed)
SURGEON:  [name], MD     PREPROCEDURE DIAGNOSES:   1. Mobitz II second degree AV block  2. Pacemaker at [facility] battery status.    POSTPROCEDURE DIAGNOSES:   1. Mobitz II second degree AV block  2. Pacemaker at [facility] battery status.    PROCEDURES:   1. Pacemaker pulse generator replacement.   2. Skin pocket revision.     INTRODUCTION:  Noah Thompson is a 71 y.o. male with a history of second degree AV block. He has done well since his pacemaker was implanted.  He has recently reached [facility] battery status.  He presents today for pacemaker pulse generator replacement.       DESCRIPTION OF THE PROCEDURE:  Informed written consent was obtained, and the patient was brought to the electrophysiology lab in the fasting state.  The patient's pacemaker was interrogated today and found to be at elective replacement indicator battery status.  The patient required no sedation for the procedure today.  The patient's right chest was prepped and draped in the usual sterile fashion by the EP lab staff.  The skin overlying the existing pacemaker was infiltrated with lidocaine for local analgesia.  A 4-cm incision was made over the pacemaker pocket.  Using a combination of sharp and blunt dissection, the pacemaker was exposed and removed from the body.  The device was disconnected from the leads. A single silk suture was identified and removed which had secured the device to the pectoralis fascia.  There was no foreign matter or debris within the pocket.  The atrial lead was confirmed to be a [employer] model [id number] (serial number [id number]) lead implanted on 08/26/1991.  The right ventricular lead was confirmed to be a [employer] model 1216T-60 [phone]) lead implanted on the same date as the atrial lead (above).  Both leads were examined and their integrity was confirmed to be intact.  Atrial lead P-waves measured 4 mV with impedance of 472 ohms and a threshold of 0.6 V at 0.5 msec.  Right ventricular lead R-waves  measured 9.2 mV with impedance of 504 ohms and a threshold of 1.1 V at 0.5 msec.  Both leads were connected to a [address] RF model [id number] (serial number [id number]) pacemaker.  The pocket was revised to accommodate this new device.  Electrocautery was required to assure hemostasis.  The pocket was irrigated with copious gentamicin solution. The pacemaker was then placed into the pocket.  The pocket was then closed in 2 layers with 2-0 Vicryl suture over the subcutaneous and subcuticular layers.  Steri-Strips and a sterile dressing were then applied.  There were no early apparent complications.     CONCLUSIONS:   1. Successful pacemaker pulse generator replacement for elective replacement indicator battery status   2. No early apparent complications.     [name], MD 01/25/2012 2:10 PM

## 2012-01-29 ENCOUNTER — Encounter: Payer: Self-pay | Admitting: *Deleted

## 2012-02-01 ENCOUNTER — Ambulatory Visit (INDEPENDENT_AMBULATORY_CARE_PROVIDER_SITE_OTHER): Payer: Medicare Other | Admitting: *Deleted

## 2012-02-01 ENCOUNTER — Encounter: Payer: Self-pay | Admitting: Internal Medicine

## 2012-02-01 DIAGNOSIS — I359 Nonrheumatic aortic valve disorder, unspecified: Secondary | ICD-10-CM

## 2012-02-01 DIAGNOSIS — I441 Atrioventricular block, second degree: Secondary | ICD-10-CM

## 2012-02-01 DIAGNOSIS — Z954 Presence of other heart-valve replacement: Secondary | ICD-10-CM

## 2012-02-01 DIAGNOSIS — I442 Atrioventricular block, complete: Secondary | ICD-10-CM

## 2012-02-01 LAB — PACEMAKER DEVICE OBSERVATION
AL AMPLITUDE: 4 mv
BAMS-0001: 180 {beats}/min
BATTERY VOLTAGE: 3.0381 V
RV LEAD IMPEDENCE PM: 450 Ohm

## 2012-02-01 NOTE — Progress Notes (Signed)
Wound check pacer in clinic  

## 2012-02-26 ENCOUNTER — Other Ambulatory Visit: Payer: Self-pay | Admitting: Internal Medicine

## 2012-03-08 ENCOUNTER — Ambulatory Visit (INDEPENDENT_AMBULATORY_CARE_PROVIDER_SITE_OTHER): Payer: Medicare Other | Admitting: *Deleted

## 2012-03-08 DIAGNOSIS — Z954 Presence of other heart-valve replacement: Secondary | ICD-10-CM

## 2012-03-08 DIAGNOSIS — I359 Nonrheumatic aortic valve disorder, unspecified: Secondary | ICD-10-CM

## 2012-03-26 ENCOUNTER — Other Ambulatory Visit: Payer: Self-pay | Admitting: Family Medicine

## 2012-04-26 ENCOUNTER — Ambulatory Visit (INDEPENDENT_AMBULATORY_CARE_PROVIDER_SITE_OTHER): Payer: Medicare Other | Admitting: *Deleted

## 2012-04-26 DIAGNOSIS — Z954 Presence of other heart-valve replacement: Secondary | ICD-10-CM

## 2012-04-26 DIAGNOSIS — I359 Nonrheumatic aortic valve disorder, unspecified: Secondary | ICD-10-CM

## 2012-04-26 LAB — POCT INR: INR: 3.1

## 2012-05-15 ENCOUNTER — Encounter: Payer: Medicare Other | Admitting: Internal Medicine

## 2012-05-20 ENCOUNTER — Encounter: Payer: Self-pay | Admitting: Internal Medicine

## 2012-06-05 ENCOUNTER — Encounter: Payer: Self-pay | Admitting: Internal Medicine

## 2012-06-05 ENCOUNTER — Ambulatory Visit (INDEPENDENT_AMBULATORY_CARE_PROVIDER_SITE_OTHER): Payer: Medicare Other | Admitting: Internal Medicine

## 2012-06-05 ENCOUNTER — Ambulatory Visit (INDEPENDENT_AMBULATORY_CARE_PROVIDER_SITE_OTHER): Payer: Medicare Other | Admitting: *Deleted

## 2012-06-05 VITALS — BP 116/86 | HR 63 | Ht 61.32 in | Wt 186.1 lb

## 2012-06-05 DIAGNOSIS — I10 Essential (primary) hypertension: Secondary | ICD-10-CM

## 2012-06-05 DIAGNOSIS — Z954 Presence of other heart-valve replacement: Secondary | ICD-10-CM

## 2012-06-05 DIAGNOSIS — Z950 Presence of cardiac pacemaker: Secondary | ICD-10-CM

## 2012-06-05 DIAGNOSIS — R252 Cramp and spasm: Secondary | ICD-10-CM | POA: Insufficient documentation

## 2012-06-05 DIAGNOSIS — I4729 Other ventricular tachycardia: Secondary | ICD-10-CM

## 2012-06-05 DIAGNOSIS — I359 Nonrheumatic aortic valve disorder, unspecified: Secondary | ICD-10-CM

## 2012-06-05 DIAGNOSIS — I441 Atrioventricular block, second degree: Secondary | ICD-10-CM

## 2012-06-05 DIAGNOSIS — I472 Ventricular tachycardia, unspecified: Secondary | ICD-10-CM

## 2012-06-05 DIAGNOSIS — E785 Hyperlipidemia, unspecified: Secondary | ICD-10-CM

## 2012-06-05 LAB — PACEMAKER DEVICE OBSERVATION
AL AMPLITUDE: 4.4 mv
AL IMPEDENCE PM: 450 Ohm
DEVICE MODEL PM: 7364115
RV LEAD IMPEDENCE PM: 550 Ohm
RV LEAD THRESHOLD: 1.25 V
VENTRICULAR PACING PM: 1.5

## 2012-06-05 LAB — POCT INR: INR: 3.4

## 2012-06-05 NOTE — Patient Instructions (Signed)
Your physician wants you to follow-up in: July 2014 with Dr Johney Frame Bonita Quin will receive a reminder letter in the mail two months in advance. If you don't receive a letter, please call our office to schedule the follow-up appointment.    Your physician has requested that you have an echocardiogram. Echocardiography is a painless test that uses sound waves to create images of your heart. It provides your doctor with information about the size and shape of your heart and how well your heart's chambers and valves are working. This procedure takes approximately one hour. There are no restrictions for this procedure.   Your physician recommends that you return for lab work today

## 2012-06-05 NOTE — Progress Notes (Signed)
PCP:  [name], MD  The patient presents today for routine electrophysiology followup.  Since last being seen in our clinic, the patient reports doing very well.  His complaint today is with intermittent cramping of his hands.  Today, he denies symptoms of palpitations, chest pain, shortness of breath, orthopnea, PND, lower extremity edema, dizziness, presyncope, syncope, or neurologic sequela.  The patient feels that he is tolerating medications without difficulties and is otherwise without complaint today.   Past Medical History  Diagnosis Date  . Hyperlipidemia   . Hypertension   . Depression   . Heart disease     s/p pacer  . Kidney disease     h/o nephritis as young adult  . S/P aortic valve replacement 1993    for aortic stenosis  . Pacemaker 1993    for AV block   Past Surgical History  Procedure Date  . Aortic valve replacement 1993    St. Jude Mechanical Valve  . Pacemaker insertion 1993    for AV block, not pacemaker dependant  . Anal fissure repair 1970s    Current Outpatient Prescriptions  Medication Sig Dispense Refill  . acetaminophen (TYLENOL) 500 MG tablet Take 1,000 mg by mouth every 6 (six) hours as needed. For pain or fever      . amLODipine (NORVASC) 10 MG tablet TAKE ONE TABLET BY MOUTH EVERY NIGHT    AT BEDTIME  90 tablet  0  . dextromethorphan-guaiFENesin (MUCINEX DM) 30-600 MG per 12 hr tablet Take 1 tablet by mouth 2 (two) times daily as needed. For congestion       . fish oil-omega-3 fatty acids 1000 MG capsule Take 2 g by mouth daily.       . Multiple Vitamin (MULTIVITAMIN WITH MINERALS) TABS Take 1 tablet by mouth as needed.       Marland Kitchen omeprazole (PRILOSEC) 20 MG capsule Take 20 mg by mouth daily as needed.       . warfarin (COUMADIN) 5 MG tablet TAKE ONE TABLET BY MOUTH EVERY DAY  60 tablet  3  . [DISCONTINUED] amLODipine (NORVASC) 10 MG tablet Take 10 mg by mouth at bedtime.      . [DISCONTINUED] warfarin (COUMADIN) 5 MG tablet Take 5 mg  by mouth daily.        No Known Allergies  History   Social History  . Marital Status: Married    Spouse Name: N/A    Number of Children: N/A  . Years of Education: N/A   Occupational History  . Not on file.   Social History Main Topics  . Smoking status: Never Smoker   . Smokeless tobacco: Never Used  . Alcohol Use: No  . Drug Use: No  . Sexually Active: Not on file   Other Topics Concern  . Not on file   Social History Narrative   Lives with wife, 5 dogs.  grown children away.Occupation: retired, was Nurse, mental health: welding    Family History  Problem Relation Age of Onset  . Arthritis Mother   . Arthritis Father   . Heart disease Father     CHF  . Coronary artery disease Neg Hx   . Stroke Neg Hx   . Diabetes Neg Hx   . Cancer Father 3    prostate    ROS-  All systems are reviewed and are negative except as outlined in the HPI above   Physical Exam: Filed Vitals:   06/05/12 1628  BP: 116/86  Pulse: 63  Height: 5' 1.32" (1.558 m)  Weight: 186 lb 1.9 oz (84.423 kg)  SpO2: 98%    GEN- The patient is well appearing, alert and oriented x 3 today.   Head- normocephalic, atraumatic Eyes-  Sclera clear, conjunctiva pink Ears- hearing intact Oropharynx- clear Neck- supple, no JVP Lymph- no cervical lymphadenopathy Lungs- Clear to ausculation bilaterally, normal work of breathing Chest- pacemaker pocket is well healed Heart- Regular rate and rhythm, mechanical S2 GI- soft, NT, ND, + BS Extremities- no clubbing, cyanosis, or edema  Pacemaker interrogation- reviewed in detail today,  See PACEART report  Assessment and Plan:  1, Second degree AV block Normal pacemaker function See Pace Art report No changes today  2. VT Pacemaker interrogation today reveals 2 episodes of nonsustained VT (octber and November).  The patient was asymptomatic with these. I will check electrolytes, TFTs, and an echo at this time.  Will consider additional of a  beta blocker if he develops further VT.  3. HTN Stable No change required today bmet today  4. HL He wishes to have lipids followed by PCP  5. Mechanical aortic valve Continue coumadin Obtain an echo

## 2012-06-06 LAB — TSH: TSH: 0.98 u[IU]/mL (ref 0.35–5.50)

## 2012-06-06 LAB — T4, FREE: Free T4: 0.88 ng/dL (ref 0.60–1.60)

## 2012-06-06 LAB — MAGNESIUM: Magnesium: 2.5 mg/dL (ref 1.5–2.5)

## 2012-06-06 LAB — BASIC METABOLIC PANEL
Chloride: 105 mEq/L (ref 96–112)
Creatinine, Ser: 1 mg/dL (ref 0.4–1.5)
Potassium: 4.2 mEq/L (ref 3.5–5.1)

## 2012-06-13 ENCOUNTER — Ambulatory Visit (HOSPITAL_COMMUNITY): Payer: Medicare Other | Attending: Internal Medicine | Admitting: Radiology

## 2012-06-13 DIAGNOSIS — I10 Essential (primary) hypertension: Secondary | ICD-10-CM

## 2012-06-13 DIAGNOSIS — I369 Nonrheumatic tricuspid valve disorder, unspecified: Secondary | ICD-10-CM | POA: Insufficient documentation

## 2012-06-13 DIAGNOSIS — I441 Atrioventricular block, second degree: Secondary | ICD-10-CM

## 2012-06-13 DIAGNOSIS — I359 Nonrheumatic aortic valve disorder, unspecified: Secondary | ICD-10-CM

## 2012-06-13 DIAGNOSIS — I472 Ventricular tachycardia: Secondary | ICD-10-CM

## 2012-06-13 DIAGNOSIS — I4729 Other ventricular tachycardia: Secondary | ICD-10-CM

## 2012-06-13 NOTE — Progress Notes (Signed)
Echocardiogram performed.  

## 2012-06-27 ENCOUNTER — Telehealth: Payer: Self-pay | Admitting: Family Medicine

## 2012-06-27 MED ORDER — AMLODIPINE BESYLATE 10 MG PO TABS: 10.0000 mg | ORAL_TABLET | Freq: Every day | ORAL | Status: DC

## 2012-06-27 NOTE — Telephone Encounter (Signed)
Patient needs a med refill for blood pressure medication.  He will be out by tomorrow. He has a follow up appointment on 07/05/12 with Dr. Sharen Hones. [facility] pharmacy

## 2012-06-27 NOTE — Telephone Encounter (Signed)
Filled.  plz notify pt.

## 2012-06-27 NOTE — Telephone Encounter (Signed)
Advised patient's wife. 

## 2012-07-05 ENCOUNTER — Encounter: Payer: Self-pay | Admitting: Family Medicine

## 2012-07-05 ENCOUNTER — Ambulatory Visit (INDEPENDENT_AMBULATORY_CARE_PROVIDER_SITE_OTHER): Payer: Medicare Other | Admitting: Family Medicine

## 2012-07-05 VITALS — BP 118/76 | HR 68 | Temp 98.0°F | Ht 71.5 in | Wt 191.5 lb

## 2012-07-05 DIAGNOSIS — Z23 Encounter for immunization: Secondary | ICD-10-CM

## 2012-07-05 DIAGNOSIS — R059 Cough, unspecified: Secondary | ICD-10-CM

## 2012-07-05 DIAGNOSIS — I10 Essential (primary) hypertension: Secondary | ICD-10-CM

## 2012-07-05 DIAGNOSIS — R05 Cough: Secondary | ICD-10-CM

## 2012-07-05 MED ORDER — GUAIFENESIN-CODEINE 100-10 MG/5ML PO SYRP: 5.0000 mL | ORAL_SOLUTION | Freq: Every evening | ORAL | Status: DC | PRN

## 2012-07-05 NOTE — Progress Notes (Signed)
  Subjective:    Patient ID: Noah Thompson, male    DOB: Nov 25, 1940, 72 y.o.   MRN: [id number]  HPI CC: BP check  Feeling well.  Trying to eat healthier.  Drinking plenty of water.  Avoids salt.  No HA, vision changes, CP/tightness, SOB, leg swelling.   Compliant with amlodipine 10mg  daily.  Off maxzide. Tolerating meds well.  Taking coumadin for h/o valve replacement.  Recent upper resp infection with cough.  Cough worse at night.  Requests cough syrup for night time.  Wt Readings from Last 3 Encounters:  07/05/12 191 lb 8 oz (86.864 kg)  06/05/12 186 lb 1.9 oz (84.423 kg)  01/25/12 185 lb (83.915 kg)   BP Readings from Last 3 Encounters:  07/05/12 118/76  06/05/12 116/86  01/25/12 135/85   Lab Results  Component Value Date   INR 3.4 06/05/2012   INR 3.1 04/26/2012   INR 3.1 03/08/2012   PROTIME 19.9 12/07/2008   Past Medical History  Diagnosis Date  . Hyperlipidemia   . Hypertension   . Depression   . Heart disease     s/p pacer  . Kidney disease     h/o nephritis as young adult  . S/P aortic valve replacement 1993    for aortic stenosis  . 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 Constitutional: He appears well-developed and well-nourished. No distress.  HENT:  Head: Normocephalic and atraumatic.  Mouth/Throat: Oropharynx is clear and moist. No oropharyngeal exudate.  Eyes: Conjunctivae normal and EOM are normal. Pupils are equal, round, and reactive to light. No scleral icterus.  Cardiovascular: Normal rate, regular rhythm, normal heart sounds and intact distal pulses.   No murmur heard. Pulmonary/Chest: Effort normal and breath sounds normal. No respiratory distress. He has no wheezes. He has no rales.  Musculoskeletal: He exhibits no edema.      Assessment & Plan:

## 2012-07-05 NOTE — Addendum Note (Signed)
Addended by: Criselda Peaches B on: 07/05/2012 01:29 PM   Modules accepted: Orders

## 2012-07-05 NOTE — Assessment & Plan Note (Signed)
Chronic, stable. Continue med. No changes today.

## 2012-07-05 NOTE — Patient Instructions (Signed)
Try codeine cough syrup at night. Watch for worsening productive cough or fever. Good to see you today, call us with questions. Return at next visit in 6-8 months for medicare wellness visit.

## 2012-07-05 NOTE — Assessment & Plan Note (Signed)
Lungs clear today. Anticipate post-viral cough. May use codeine cough syrup as needed. Discussed red flags to return.

## 2012-09-09 ENCOUNTER — Encounter: Payer: Medicare Other | Admitting: *Deleted

## 2012-09-10 ENCOUNTER — Encounter: Payer: Self-pay | Admitting: *Deleted

## 2012-09-30 ENCOUNTER — Other Ambulatory Visit: Payer: Self-pay | Admitting: Family Medicine

## 2012-10-21 ENCOUNTER — Encounter: Payer: Self-pay | Admitting: *Deleted

## 2012-10-23 ENCOUNTER — Ambulatory Visit (INDEPENDENT_AMBULATORY_CARE_PROVIDER_SITE_OTHER): Payer: Medicare Other | Admitting: *Deleted

## 2012-10-23 DIAGNOSIS — Z954 Presence of other heart-valve replacement: Secondary | ICD-10-CM

## 2012-10-23 MED ORDER — WARFARIN SODIUM 5 MG PO TABS: ORAL_TABLET | ORAL | Status: DC

## 2012-12-04 ENCOUNTER — Ambulatory Visit (INDEPENDENT_AMBULATORY_CARE_PROVIDER_SITE_OTHER): Payer: Medicare Other | Admitting: *Deleted

## 2012-12-04 DIAGNOSIS — Z954 Presence of other heart-valve replacement: Secondary | ICD-10-CM

## 2012-12-04 LAB — POCT INR: INR: 3.1

## 2013-01-06 ENCOUNTER — Other Ambulatory Visit: Payer: Self-pay | Admitting: Internal Medicine

## 2013-01-13 ENCOUNTER — Encounter: Payer: Self-pay | Admitting: Family Medicine

## 2013-01-13 ENCOUNTER — Ambulatory Visit (INDEPENDENT_AMBULATORY_CARE_PROVIDER_SITE_OTHER): Payer: Medicare Other | Admitting: Family Medicine

## 2013-01-13 VITALS — BP 124/86 | HR 84 | Temp 97.7°F | Wt 184.5 lb

## 2013-01-13 DIAGNOSIS — W57XXXA Bitten or stung by nonvenomous insect and other nonvenomous arthropods, initial encounter: Secondary | ICD-10-CM | POA: Insufficient documentation

## 2013-01-13 DIAGNOSIS — T148 Other injury of unspecified body region: Secondary | ICD-10-CM

## 2013-01-13 NOTE — Progress Notes (Signed)
  Subjective:    Patient ID: Noah Thompson, male    DOB: 10-10-1940, 72 y.o.   MRN: [id number]  HPI CC: tick bite  Found tick this morning.  Unable to fully remove, even with tweezers (wife tried).  Thinks attached <24 hours.  Was outside over weekend.  Also has puppies at home.  Tried using nail polish removal.    No tick borne illness sxs.  Past Medical History  Diagnosis Date  . Hyperlipidemia   . Hypertension   . Depression   . Heart disease     s/p pacer  . Kidney disease     h/o nephritis as young adult  . S/P aortic valve replacement 1993    for aortic stenosis  . 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 History of pneumonia      Review of Systems Per HPI    Objective:   Physical Exam  Nursing note and vitals reviewed. Constitutional: He appears well-developed and well-nourished. No distress.  Skin:  Left back inferior to belt line with tick head embedded in skin. Removed with thin nosed forceps Area cleaned with alcohol and dressed with triple antibiotic ointment and bandaid       Assessment & Plan:

## 2013-01-13 NOTE — Patient Instructions (Addendum)
Watch for fever, new rash, abd pain, nausea, joint pains, headache.  Deer Tick Bite Deer ticks are brown arachnids (spider family) that vary in size from as small as the head of a pin to 1/4 inch (1/2 cm) diameter. They thrive in wooded areas. Deer are the preferred host of adult deer ticks. Small rodents are the host of young ticks (nymphs). When a person walks in a field or wooded area, young and adult ticks in the surrounding grass and vegetation can attach themselves to the skin. They can suck blood for hours to days if unnoticed. Ticks are found all over the [place] Some ticks carry a specific bacteria (Borrelia burgdorferi) that causes an infection called Lyme disease. The bacteria is typically passed into a person during the blood sucking process. This happens after the tick has been attached for at least a number of hours. While ticks can be found all over the [place] those carrying the bacteria that causes Lyme disease are most common in [place] and the [place]. Only a small proportion of ticks in these areas carry the Lyme disease bacteria and cause human infections. Ticks usually attach to warm spots on the body, such as the:  Head.  Back.  Neck.  Armpits.  Groin. SYMPTOMS  Most of the time, a deer tick bite will not be felt. You may or may not see the attached tick. You may notice mild irritation or redness around the bite site. If the deer tick passes the Lyme disease bacteria to a person, a round, red rash may be noticed 2 to 3 days after the bite. The rash may be clear in the middle, like a bull's-eye or target. If not treated, other symptoms may develop several days to weeks after the onset of the rash. These symptoms may include:  New rash lesions.  Fatigue and weakness.  General ill feeling and achiness.  Chills.  Headache and neck pain.  Swollen lymph glands.  Sore muscles and joints. 5 to 15% of untreated people with Lyme disease may develop more severe illnesses  after several weeks to months. This may include inflammation of the brain lining (meningitis), nerve palsies, an abnormal heartbeat, or severe muscle and joint pain and inflammation (myositis or arthritis). DIAGNOSIS   Physical exam and medical history.  Viewing the tick if it was saved for confirmation.  Blood tests (to check or confirm the presence of Lyme disease). TREATMENT  Most ticks do not carry disease. If found, an attached tick should be removed using tweezers. Tweezers should be placed under the body of the tick so it is removed by its attachment parts (pincers). If there are signs or symptoms of being sick, or Lyme disease is confirmed, medicines (antibiotics) that kill germs are usually prescribed. In more severe cases, antibiotics may be given through an intravenous (IV) access. HOME CARE INSTRUCTIONS   Always remove ticks with tweezers. Do not use petroleum jelly or other methods to kill or remove the tick. Slide the tweezers under the body and pull out as much as you can. If you are not sure what it is, save it in a jar and show your caregiver.  Once you remove the tick, the skin will heal on its own. Wash your hands and the affected area with water and soap. You may place a bandage on the affected area.  Take medicine as directed. You may be advised to take a full course of antibiotics.  Follow up with your caregiver as recommended. FINDING  OUT THE RESULTS OF YOUR TEST Not all test results are available during your visit. If your test results are not back during the visit, make an appointment with your caregiver to find out the results. Do not assume everything is normal if you have not heard from your caregiver or the medical facility. It is important for you to follow up on all of your test results. PROGNOSIS  If Lyme disease is confirmed, early treatment with antibiotics is very effective. Following preventive guidelines is important since it is possible to get the disease  more than once. PREVENTION   Wear long sleeves and long pants in wooded or grassy areas. Tuck your pants into your socks.  Use an insect repellent while hiking.  Check yourself, your children, and your pets regularly for ticks after playing outside.  Clear piles of leaves or brush from your yard. Ticks might live there. SEEK MEDICAL CARE IF:   You or your child has an oral temperature above 102 F (38.9 C).  You develop a severe headache following the bite.  You feel generally ill.  You notice a rash.  You are having trouble removing the tick.  The bite area has red skin or yellow drainage. SEEK IMMEDIATE MEDICAL CARE IF:   Your face is weak and droopy or you have other neurological symptoms.  You have severe joint pain or weakness. MAKE SURE YOU:   Understand these instructions.  Will watch your condition.  Will get help right away if you are not doing well or get worse. FOR MORE INFORMATION Centers for Disease Control and Prevention: FootballExhibition.com.br American Academy [employer]: www.https://powers.com/ Document Released: 09/13/2009 Document Revised: 09/11/2011 Document Reviewed: 09/13/2009 [facility] Patient Information 2014 [place], [place].

## 2013-01-13 NOTE — Assessment & Plan Note (Signed)
Tick head removed in its entirety. Dressed with abx ointment. Red flags to seek care discussed. Pt agrees with plan.

## 2013-01-15 ENCOUNTER — Ambulatory Visit (INDEPENDENT_AMBULATORY_CARE_PROVIDER_SITE_OTHER): Payer: Medicare Other | Admitting: *Deleted

## 2013-01-15 DIAGNOSIS — Z954 Presence of other heart-valve replacement: Secondary | ICD-10-CM

## 2013-01-15 DIAGNOSIS — Z7901 Long term (current) use of anticoagulants: Secondary | ICD-10-CM | POA: Insufficient documentation

## 2013-01-15 LAB — POCT INR: INR: 3.6

## 2013-05-12 ENCOUNTER — Encounter: Payer: Self-pay | Admitting: Family Medicine

## 2013-05-12 ENCOUNTER — Ambulatory Visit (INDEPENDENT_AMBULATORY_CARE_PROVIDER_SITE_OTHER): Payer: Medicare Other | Admitting: Family Medicine

## 2013-05-12 VITALS — BP 138/84 | HR 62 | Temp 98.3°F | Wt 189.5 lb

## 2013-05-12 DIAGNOSIS — R05 Cough: Secondary | ICD-10-CM

## 2013-05-12 DIAGNOSIS — R21 Rash and other nonspecific skin eruption: Secondary | ICD-10-CM | POA: Insufficient documentation

## 2013-05-12 DIAGNOSIS — R059 Cough, unspecified: Secondary | ICD-10-CM

## 2013-05-12 MED ORDER — HYDROCODONE-HOMATROPINE 5-1.5 MG/5ML PO SYRP: 5.0000 mL | ORAL_SOLUTION | Freq: Four times a day (QID) | ORAL | Status: AC | PRN

## 2013-05-12 NOTE — Progress Notes (Signed)
Pre-visit discussion using our clinic review tool. No additional management support is needed unless otherwise documented below in the visit note.  

## 2013-05-12 NOTE — Assessment & Plan Note (Signed)
Unclear etiology - improvement with calomine points against scabies (and wife not affected).   ?contact dermatitis to smoke exposure from burning posion oak/ivy. ?manifestation of viral exanthem vs irritant dermatitis to detergent. As improving - will monitor for now.  Pt will update me if rash returning.

## 2013-05-12 NOTE — Progress Notes (Signed)
  Subjective:    Patient ID: Noah Thompson, male    DOB: Feb 10, 1941, 72 y.o.   MRN: [id number]  HPI CC: cough  Pleasant 72 yo with h/o AVR for h/o AS on coumadin, with pacemaker for AVB and h/o PNA presents with severe coughing spells - to point last night of several episodes of post tussive emesis.  No sweating with fits.  Bad cough present over last 3 days.  Did have cold 3 wks ago with cough, did feel better from this.  Now cough returned.  Some nausea prior to illness.  Cough dry and hacking.  + head congestion.  Did have pruritic rash on arms and legs for last 2 weeks - significant itch that he has scratched - has improved after he used calomine lotion Friday night.  Has been burning leaves recently - no new lotions, detergents, soaps or shampoos.  Tried children's mucinex and ginger and honey tea with lemon which helped.  No fevers/chills, abd pain, headaches, ear or tooth pain.  Grandchildren recently sick with croup.  No h/o asthma, COPD, no smokers at home.   Past Medical History  Diagnosis Date  . Hyperlipidemia   . Hypertension   . Depression   . Heart disease     s/p pacer  . Kidney disease     h/o nephritis as young adult  . S/P aortic valve replacement 1993    for aortic stenosis  . 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 History of pneumonia     Review of Systems Per HPI    Objective:   Physical Exam  Nursing note and vitals reviewed. Constitutional: He appears well-developed and well-nourished. No distress.  HENT:  Head: Normocephalic and atraumatic.  Right Ear: Hearing, tympanic membrane, external ear and ear canal normal.  Left Ear: Hearing, tympanic membrane, external ear and ear canal normal.  Nose: Nose normal. No mucosal edema or rhinorrhea. Right sinus exhibits no maxillary sinus tenderness and no frontal sinus tenderness. Left sinus exhibits no maxillary sinus tenderness and no frontal sinus tenderness.  Mouth/Throat: Uvula is midline, oropharynx is  clear and moist and mucous membranes are normal. No oropharyngeal exudate, posterior oropharyngeal edema, posterior oropharyngeal erythema or tonsillar abscesses.  Eyes: Conjunctivae and EOM are normal. Pupils are equal, round, and reactive to light. No scleral icterus.  Neck: Normal range of motion. Neck supple.  Cardiovascular: Normal rate, regular rhythm and intact distal pulses.   Murmur (mechanical murmur) heard. Pulmonary/Chest: Effort normal and breath sounds normal. No respiratory distress. He has no wheezes. He has no rales.  Musculoskeletal: He exhibits no edema.  Lymphadenopathy:    He has no cervical adenopathy.  Skin: Skin is warm and dry. Rash noted.  Faint excoriated pruriticpapular rash throughout body - arms, legs, trunk, and some on lower back.       Assessment & Plan:

## 2013-05-12 NOTE — Assessment & Plan Note (Signed)
Anticipate post -infectious or post viral cough.  Treat with hycodan cough syrup - worked well in the past. Red flags to return discussed.

## 2013-05-12 NOTE — Patient Instructions (Signed)
i think this is a post viral cough - treat with prescription cough syrup printed today. Good to see you today, call us with questions. If fever >101, or worsening productive cough, or returning rash, let me know.

## 2013-05-28 ENCOUNTER — Ambulatory Visit (INDEPENDENT_AMBULATORY_CARE_PROVIDER_SITE_OTHER): Payer: Medicare Other | Admitting: *Deleted

## 2013-05-28 DIAGNOSIS — Z7901 Long term (current) use of anticoagulants: Secondary | ICD-10-CM

## 2013-05-28 DIAGNOSIS — Z954 Presence of other heart-valve replacement: Secondary | ICD-10-CM

## 2013-05-28 LAB — POCT INR: INR: 3.8

## 2013-06-11 ENCOUNTER — Encounter: Payer: Self-pay | Admitting: *Deleted

## 2013-06-20 ENCOUNTER — Other Ambulatory Visit: Payer: Self-pay | Admitting: Internal Medicine

## 2013-07-24 ENCOUNTER — Ambulatory Visit (INDEPENDENT_AMBULATORY_CARE_PROVIDER_SITE_OTHER): Payer: Medicare Other

## 2013-07-24 ENCOUNTER — Encounter: Payer: Self-pay | Admitting: Internal Medicine

## 2013-07-24 ENCOUNTER — Encounter (INDEPENDENT_AMBULATORY_CARE_PROVIDER_SITE_OTHER): Payer: Self-pay

## 2013-07-24 ENCOUNTER — Ambulatory Visit (INDEPENDENT_AMBULATORY_CARE_PROVIDER_SITE_OTHER): Payer: Medicare Other | Admitting: Internal Medicine

## 2013-07-24 VITALS — BP 148/86 | HR 76 | Ht 71.5 in | Wt 189.0 lb

## 2013-07-24 DIAGNOSIS — Z23 Encounter for immunization: Secondary | ICD-10-CM

## 2013-07-24 DIAGNOSIS — I4729 Other ventricular tachycardia: Secondary | ICD-10-CM

## 2013-07-24 DIAGNOSIS — I10 Essential (primary) hypertension: Secondary | ICD-10-CM

## 2013-07-24 DIAGNOSIS — I441 Atrioventricular block, second degree: Secondary | ICD-10-CM

## 2013-07-24 DIAGNOSIS — I472 Ventricular tachycardia, unspecified: Secondary | ICD-10-CM

## 2013-07-24 DIAGNOSIS — Z950 Presence of cardiac pacemaker: Secondary | ICD-10-CM

## 2013-07-24 DIAGNOSIS — Z954 Presence of other heart-valve replacement: Secondary | ICD-10-CM

## 2013-07-24 DIAGNOSIS — Z7901 Long term (current) use of anticoagulants: Secondary | ICD-10-CM

## 2013-07-24 LAB — MDC_IDC_ENUM_SESS_TYPE_INCLINIC
Battery Remaining Longevity: 93.6 mo
Battery Voltage: 2.95 V
Brady Statistic RV Percent Paced: 1.4 %
Implantable Pulse Generator Model: 2210
Lead Channel Impedance Value: 450 Ohm
Lead Channel Pacing Threshold Amplitude: 0.75 V
Lead Channel Sensing Intrinsic Amplitude: 3.1 mV
Lead Channel Setting Pacing Amplitude: 2.5 V
Lead Channel Setting Pacing Pulse Width: 0.5 ms
MDC IDC MSMT LEADCHNL RA PACING THRESHOLD PULSEWIDTH: 0.5 ms
MDC IDC MSMT LEADCHNL RV IMPEDANCE VALUE: 525 Ohm
MDC IDC MSMT LEADCHNL RV PACING THRESHOLD AMPLITUDE: 1 V
MDC IDC MSMT LEADCHNL RV PACING THRESHOLD PULSEWIDTH: 0.5 ms
MDC IDC MSMT LEADCHNL RV SENSING INTR AMPL: 11.7 mV
MDC IDC PG SERIAL: 7364115
MDC IDC SESS DTM: 20150122161927
MDC IDC SET LEADCHNL RA PACING AMPLITUDE: 2 V
MDC IDC SET LEADCHNL RV SENSING SENSITIVITY: 2 mV
MDC IDC STAT BRADY RA PERCENT PACED: 61 %

## 2013-07-24 LAB — POCT INR: INR: 3.6

## 2013-07-24 NOTE — Progress Notes (Signed)
PCP:  [name], MD  The patient presents today for routine electrophysiology followup.  Since last being seen in our clinic, the patient reports doing very well.  He has no CV symptoms presently.  Today, he denies symptoms of palpitations, chest pain, shortness of breath, orthopnea, PND, lower extremity edema, dizziness, presyncope, syncope, or neurologic sequela.  The patient feels that he is tolerating medications without difficulties and is otherwise without complaint today.   Past Medical History  Diagnosis Date  . Hyperlipidemia   . Hypertension   . Depression   . Heart disease     s/p pacer  . Kidney disease     h/o nephritis as young adult  . S/P aortic valve replacement 1993    for aortic stenosis  . 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 History of pneumonia    Past Surgical History  Procedure Laterality Date  . Aortic valve replacement  1993    St. Jude Mechanical Valve  . Pacemaker insertion  1993    for AV block, not pacemaker dependant  . Anal fissure repair  1970s    Current Outpatient Prescriptions  Medication Sig Dispense Refill  . acetaminophen (TYLENOL) 500 MG tablet Take 1,000 mg by mouth every 6 (six) hours as needed. For pain or fever      . amLODipine (NORVASC) 10 MG tablet TAKE ONE (1) TABLET BY MOUTH EVERY DAY  90 tablet  3  . fish oil-omega-3 fatty acids 1000 MG capsule Take 2 g by mouth daily.       . Multiple Vitamin (MULTIVITAMIN WITH MINERALS) TABS Take 1 tablet by mouth as needed.       . ranitidine (ZANTAC) 150 MG capsule Take 150 mg by mouth as needed for heartburn.      . warfarin (COUMADIN) 5 MG tablet TAKE AS DIRECTED BY COUMADIN CLINIC  40 tablet  1   No current facility-administered medications for this visit.    No Known Allergies  History   Social History  . Marital Status: Married    Spouse Name: N/A    Number of Children: N/A  . Years of Education: N/A   Occupational History  . Not on file.   Social History Main  Topics  . Smoking status: Never Smoker   . Smokeless tobacco: Never Used  . Alcohol Use: No  . Drug Use: No  . Sexual Activity: Not on file   Other Topics Concern  . Not on file   Social History Narrative   Lives with wife, 5 dogs.  grown children away.   Occupation: retired, was Teaching laboratory technician   Activity: welding    Family History  Problem Relation Age of Onset  . Arthritis Mother   . Arthritis Father   . Heart disease Father     CHF  . Coronary artery disease Neg Hx   . Stroke Neg Hx   . Diabetes Neg Hx   . Cancer Father 67    prostate    ROS-  All systems are reviewed and are negative except as outlined in the HPI above   Physical Exam: Filed Vitals:   07/24/13 1608  BP: 148/86  Pulse: 76  Height: 5' 11.5" (1.816 m)  Weight: 189 lb (85.73 kg)    GEN- The patient is well appearing, alert and oriented x 3 today.   Head- normocephalic, atraumatic Eyes-  Sclera clear, conjunctiva pink Ears- hearing intact Oropharynx- clear Neck- supple, no JVP Lymph-  no cervical lymphadenopathy Lungs- Clear to ausculation bilaterally, normal work of breathing Chest- pacemaker pocket is well healed Heart- Regular rate and rhythm, mechanical S2, 2/6 SEM LUSB GI- soft, NT, ND, + BS Extremities- no clubbing, cyanosis, or edema  Pacemaker interrogation- reviewed in detail today,  See PACEART report  Assessment and Plan:  1, Second degree AV block Normal pacemaker function See Pace Art report No changes today  2. VT Pacemaker interrogation today reveals 1 episodes of nonsustained VT (November).  He also had an episode of atrial tachycardia in August. The patient was asymptomatic with these. He declines beta blocker therapy today.  He has been reluctant to take medicines in the past. Repeat echo  3. HTN Stable No change required today bmet today  4. HL He wishes to have lipids followed by PCP  5. Mechanical aortic valve Continue coumadin Obtain an echo to evaluate  for aortic valve gradient which was high on last echo

## 2013-07-24 NOTE — Patient Instructions (Addendum)
Your physician wants you to follow-up in: 12 months with Dr Jacquiline Doe will receive a reminder letter in the mail two months in advance. If you don't receive a letter, please call our office to schedule the follow-up appointment.    Remote monitoring is used to monitor your Pacemaker of ICD from home. This monitoring reduces the number of office visits required to check your device to one time per year. It allows [place] to keep an eye on the functioning of your device to ensure it is working properly. You are scheduled for a device check from home on 10/27/13. You may send your transmission at any time that day. If you have a wireless device, the transmission will be sent automatically. After your physician reviews your transmission, you will receive a postcard with your next transmission date.    Your physician has requested that you have an echocardiogram. Echocardiography is a painless test that uses sound waves to create images of your heart. It provides your doctor with information about the size and shape of your heart and how well your heart's chambers and valves are working. This procedure takes approximately one hour. There are no restrictions for this procedure.

## 2013-08-18 ENCOUNTER — Other Ambulatory Visit (HOSPITAL_COMMUNITY): Payer: Medicare Other

## 2013-08-21 ENCOUNTER — Telehealth: Payer: Self-pay | Admitting: *Deleted

## 2013-08-21 NOTE — Telephone Encounter (Signed)
Message copied by Deliah Boston on Thu Aug 21, 2013  2:25 PM ------      Message from: Mariane Masters D      Created: Thu Aug 21, 2013 10:27 AM      Regarding: ECHO       08/21/13 Patient cancel. ------

## 2013-08-28 ENCOUNTER — Other Ambulatory Visit (HOSPITAL_COMMUNITY): Payer: Medicare Other

## 2013-09-04 ENCOUNTER — Ambulatory Visit (INDEPENDENT_AMBULATORY_CARE_PROVIDER_SITE_OTHER): Payer: Medicare Other

## 2013-09-04 DIAGNOSIS — Z954 Presence of other heart-valve replacement: Secondary | ICD-10-CM

## 2013-09-04 DIAGNOSIS — Z5181 Encounter for therapeutic drug level monitoring: Secondary | ICD-10-CM

## 2013-09-04 DIAGNOSIS — Z7901 Long term (current) use of anticoagulants: Secondary | ICD-10-CM

## 2013-09-04 LAB — POCT INR: INR: 3.4

## 2013-09-15 ENCOUNTER — Other Ambulatory Visit: Payer: Self-pay | Admitting: Internal Medicine

## 2013-09-19 ENCOUNTER — Encounter: Payer: Self-pay | Admitting: Cardiovascular Disease

## 2013-09-19 ENCOUNTER — Ambulatory Visit (HOSPITAL_COMMUNITY): Payer: Medicare Other | Attending: Cardiovascular Disease | Admitting: Cardiology

## 2013-09-19 DIAGNOSIS — I4729 Other ventricular tachycardia: Secondary | ICD-10-CM

## 2013-09-19 DIAGNOSIS — I359 Nonrheumatic aortic valve disorder, unspecified: Secondary | ICD-10-CM

## 2013-09-19 DIAGNOSIS — I472 Ventricular tachycardia, unspecified: Secondary | ICD-10-CM | POA: Insufficient documentation

## 2013-09-19 DIAGNOSIS — Z954 Presence of other heart-valve replacement: Secondary | ICD-10-CM

## 2013-09-19 NOTE — Progress Notes (Signed)
Echo performed. 

## 2013-10-06 ENCOUNTER — Encounter: Payer: Self-pay | Admitting: *Deleted

## 2013-10-07 ENCOUNTER — Telehealth: Payer: Self-pay | Admitting: Internal Medicine

## 2013-10-07 NOTE — Telephone Encounter (Signed)
New message     Have questions about echo results.  Nurse called pt and gave him the results---now wife has questions.

## 2013-10-07 NOTE — Telephone Encounter (Signed)
Spoke with patient's wife and let her know the echo results and to keep his appointment with the PA.  She understands and he will be here

## 2013-10-09 ENCOUNTER — Telehealth: Payer: Self-pay | Admitting: Internal Medicine

## 2013-10-09 NOTE — Telephone Encounter (Signed)
Called wife back and left message for her to call me back tomorrow as Dr Johney Frame is here and I will be able to discuss his symptoms with him at that time

## 2013-10-09 NOTE — Telephone Encounter (Signed)
New Message:  [name] states the pt has been having SOB on and off with activity. Pt wife states the pt is not SOB now but was earlier this morning around 3 am or so... [name] is requesting a call back.

## 2013-10-09 NOTE — Telephone Encounter (Signed)
Patients wife would like a call back today, please advise.

## 2013-10-10 NOTE — Telephone Encounter (Signed)
Spoke with patients wife and and he feels tired and thought it was due to him doing to much.  He is having SOB that has been going on for a "good while" for at least 6 months and wants to know if he can be seen sooner than 4/23.  I let her know I would see if there are any cancellations

## 2013-10-10 NOTE — Telephone Encounter (Signed)
Patient's wife is calling you back. Please call and advise. She can be reached at [phone].

## 2013-10-10 NOTE — Telephone Encounter (Signed)
Will move appointment up to 10/14/13  Noah Thompson is calling the patient

## 2013-10-13 ENCOUNTER — Encounter: Payer: Self-pay | Admitting: *Deleted

## 2013-10-13 ENCOUNTER — Other Ambulatory Visit: Payer: Self-pay | Admitting: Family Medicine

## 2013-10-14 ENCOUNTER — Encounter: Payer: Self-pay | Admitting: Physician Assistant

## 2013-10-14 ENCOUNTER — Ambulatory Visit (INDEPENDENT_AMBULATORY_CARE_PROVIDER_SITE_OTHER): Payer: Medicare Other

## 2013-10-14 ENCOUNTER — Encounter (INDEPENDENT_AMBULATORY_CARE_PROVIDER_SITE_OTHER): Payer: Self-pay

## 2013-10-14 ENCOUNTER — Ambulatory Visit (INDEPENDENT_AMBULATORY_CARE_PROVIDER_SITE_OTHER): Payer: Medicare Other | Admitting: Physician Assistant

## 2013-10-14 VITALS — BP 130/80 | HR 61 | Ht 71.5 in | Wt 180.0 lb

## 2013-10-14 DIAGNOSIS — I359 Nonrheumatic aortic valve disorder, unspecified: Secondary | ICD-10-CM

## 2013-10-14 DIAGNOSIS — I10 Essential (primary) hypertension: Secondary | ICD-10-CM

## 2013-10-14 DIAGNOSIS — I5022 Chronic systolic (congestive) heart failure: Secondary | ICD-10-CM

## 2013-10-14 DIAGNOSIS — Z5181 Encounter for therapeutic drug level monitoring: Secondary | ICD-10-CM

## 2013-10-14 DIAGNOSIS — Z950 Presence of cardiac pacemaker: Secondary | ICD-10-CM

## 2013-10-14 DIAGNOSIS — I428 Other cardiomyopathies: Secondary | ICD-10-CM

## 2013-10-14 DIAGNOSIS — Z954 Presence of other heart-valve replacement: Secondary | ICD-10-CM

## 2013-10-14 LAB — POCT INR: INR: 3.4

## 2013-10-14 MED ORDER — CARVEDILOL 3.125 MG PO TABS: 3.1250 mg | ORAL_TABLET | Freq: Two times a day (BID) | ORAL | Status: DC

## 2013-10-14 NOTE — Progress Notes (Signed)
[address], [address] [place], [place]  [address] Phone: [phone] Fax:  [phone]  Date:  10/14/2013   ID:  Noah Thompson, DOB February 14, 1941, MRN [id number]  PCP:  [name], MD  Cardiologist:  Dr. [name]   Electrophysiologist:  Dr. [name]    History of Present Illness: Noah Thompson is a 73 y.o. male with a hx of aortic stenosis, s/p St. Jude (mechanical) AVR, perioperative AV block s/p pacemaker (SJM), HTN, HL, normal LV function.  He was previously seen by Dr. Juanda Chance and established with Dr. Johney Frame in 01/2012.  He is s/p generator replacement (at [facility]) in 01/2012.  Echo in 06/2012 demonstrated EF 45-50% with mod increased gradient across his aortic valve.  Last seen by Dr. [name] in 07/2013. 1 episode of NSVT was noted on pacer interrogation as well as ATach x 1.  He declined beta blocker Rx at that time.  F/u echo was arranged.   This demonstrated worsening LVF with EF 20-25% and mod AS.  Dr. Johney Frame asked that he see me for further evaluation.     He is here with his wife. He does note dyspnea at times. However, he denies exertional dyspnea. He is overall NYHA class 2-2b.  He denies orthopnea, PND or edema. He denies increasing weight. He denies chest pain or syncope.  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  - Echo (08/2013):  EF 20-25%, AVR with mod stenosis (mean 30 mmHg), mod dilated aorta (root 36 mm), PASP 31 mmHg.   Recent Labs: No results found for requested labs within last 365 days.  Wt Readings from Last 3 Encounters:  10/14/13 180 lb (81.647 kg)  07/24/13 189 lb (85.73 kg)  05/12/13 189 lb 8 oz (85.957 kg)     Past Medical History  Diagnosis Date  . Hyperlipidemia   . Hypertension   . Depression   . Heart disease     s/p pacer  . Kidney disease     h/o nephritis as young adult  . S/P aortic valve replacement 1993    for aortic  stenosis  . 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 History of pneumonia     Current Outpatient Prescriptions  Medication Sig Dispense Refill  . acetaminophen (TYLENOL) 500 MG tablet Take 1,000 mg by mouth every 6 (six) hours as needed. For pain or fever      . amLODipine (NORVASC) 10 MG tablet TAKE ONE (1) TABLET BY MOUTH EVERY DAY  90 tablet  2  . fish oil-omega-3 fatty acids 1000 MG capsule Take 2 g by mouth daily.       . Multiple Vitamin (MULTIVITAMIN WITH MINERALS) TABS Take 1 tablet by mouth as needed.       . ranitidine (ZANTAC) 150 MG capsule Take 150 mg by mouth as needed for heartburn.      . warfarin (COUMADIN) 5 MG tablet TAKE AS DIRECTED BY  COUMADIN  CLINIC  40 tablet  3   No current facility-administered medications for this visit.    Allergies:   Review of patient's allergies indicates no known allergies.   Social History:  The patient  reports that he has never smoked. He has never used smokeless tobacco. He reports that he does not drink alcohol or use illicit drugs.   Family History:  The patient's  family history includes Arthritis in his father and mother; Cancer (age of onset: 6190) in his father; Heart disease in his father. There is no history of Coronary artery disease, Stroke, or Diabetes.   ROS:  Please see the history of present illness.   He has an occasional nonproductive cough.   All other systems reviewed and negative.   PHYSICAL EXAM: VS:  There were no vitals taken for this visit. Well nourished, well developed, in no acute distress HEENT: normal Neck: no JVD Cardiac:  normal S1, mechanical S2; RRR; 2/6 systolic murmurat the RUSB Lungs:  clear to auscultation bilaterally, no wheezing, rhonchi or rales Abd: soft, nontender, no hepatomegaly Ext: no edema Skin: warm and dry Neuro:  CNs 2-12 intact, no focal abnormalities noted  EKG:  Atrial paced, HR 61, LBBB     ASSESSMENT AND PLAN:  1. Cardiomyopathy:  Etiology unclear. He has no symptoms to suggest  angina. He is on chronic Coumadin in the setting of mechanical aortic valve replacement. I reviewed his case today with Dr. Verne Carrowhristopher McAlhany (DOD). In the setting of no symptoms suggestive of angina or decompensated heart failure, we have recommended proceeding with Myoview to rule out significant ischemic heart disease as a cause for his cardiomyopathy. Given his pacemaker and LBBB, he will be set up for a [employer] Myoview.  If high risk, consider cardiac cath.   I will try to adjust his medications. I will start him on Coreg 3.125 mg twice a day. When he is seen in follow up, we will try to start ACEI.  I anticipate stopping his amlodipine at that time. 2. Chronic Systolic CHF:  Volume stable. He is not taking any diuretics. 3. Aortic Stenosis s/p Mechanical AVR:  Valve gradient stable by recent measurement. Continue Coumadin. Continue SBE prophylaxis. 4. S/p Pacemaker:  Follow up with EP as planned. 5. Disposition:  Follow up with me in 2 weeks.  Signed, Tereso NewcomerScott Cheray Pardi, PA-C  10/14/2013 11:12 AM

## 2013-10-14 NOTE — Patient Instructions (Signed)
Your physician has recommended you make the following change in your medication:   1. Start Coreg 3.125 twice daily.  Your physician has requested that you have a lexiscan myoview. For further information please visit [email or web address] Please follow instruction sheet, as given.  Your physician recommends that you schedule a follow-up appointment in: 2 weeks with Lorin Picket

## 2013-10-23 ENCOUNTER — Ambulatory Visit: Payer: Medicare Other | Admitting: Physician Assistant

## 2013-10-27 ENCOUNTER — Encounter: Payer: Self-pay | Admitting: Internal Medicine

## 2013-10-27 ENCOUNTER — Ambulatory Visit (INDEPENDENT_AMBULATORY_CARE_PROVIDER_SITE_OTHER): Payer: Medicare Other | Admitting: *Deleted

## 2013-10-27 DIAGNOSIS — I442 Atrioventricular block, complete: Secondary | ICD-10-CM

## 2013-10-27 LAB — MDC_IDC_ENUM_SESS_TYPE_REMOTE
Battery Voltage: 2.93 V
Brady Statistic AP VP Percent: 1 %
Brady Statistic AP VS Percent: 71 %
Brady Statistic AS VP Percent: 1 %
Brady Statistic RA Percent Paced: 70 %
Brady Statistic RV Percent Paced: 1 %
Date Time Interrogation Session: 20150427062845
Implantable Pulse Generator Serial Number: 7364115
Lead Channel Impedance Value: 440 Ohm
Lead Channel Sensing Intrinsic Amplitude: 3.6 mV
Lead Channel Sensing Intrinsic Amplitude: 9.1 mV
Lead Channel Setting Pacing Amplitude: 2 V
Lead Channel Setting Pacing Pulse Width: 0.5 ms
MDC IDC MSMT BATTERY REMAINING LONGEVITY: 77 mo
MDC IDC MSMT LEADCHNL RV IMPEDANCE VALUE: 530 Ohm
MDC IDC SET LEADCHNL RV PACING AMPLITUDE: 2.5 V
MDC IDC SET LEADCHNL RV SENSING SENSITIVITY: 2 mV
MDC IDC STAT BRADY AS VS PERCENT: 27 %

## 2013-10-29 ENCOUNTER — Encounter (HOSPITAL_COMMUNITY): Payer: Medicare Other | Attending: Physician Assistant | Admitting: Radiology

## 2013-10-29 VITALS — BP 122/78 | Ht 71.5 in | Wt 180.0 lb

## 2013-10-29 DIAGNOSIS — I359 Nonrheumatic aortic valve disorder, unspecified: Secondary | ICD-10-CM

## 2013-10-29 DIAGNOSIS — I428 Other cardiomyopathies: Secondary | ICD-10-CM | POA: Insufficient documentation

## 2013-10-29 DIAGNOSIS — I10 Essential (primary) hypertension: Secondary | ICD-10-CM | POA: Insufficient documentation

## 2013-10-29 DIAGNOSIS — I5022 Chronic systolic (congestive) heart failure: Secondary | ICD-10-CM

## 2013-10-29 MED ORDER — ADENOSINE (DIAGNOSTIC) 3 MG/ML IV SOLN
0.5600 mg/kg | Freq: Once | INTRAVENOUS | Status: AC
  Administered 2013-10-29: 45.6 mg via INTRAVENOUS

## 2013-10-29 MED ORDER — TECHNETIUM TC 99M SESTAMIBI GENERIC - CARDIOLITE
33.0000 | Freq: Once | INTRAVENOUS | Status: AC | PRN
  Administered 2013-10-29: 33 via INTRAVENOUS

## 2013-10-29 MED ORDER — TECHNETIUM TC 99M SESTAMIBI GENERIC - CARDIOLITE
11.0000 | Freq: Once | INTRAVENOUS | Status: AC | PRN
  Administered 2013-10-29: 11 via INTRAVENOUS

## 2013-10-29 NOTE — Progress Notes (Signed)
[facility] SITE 3 NUCLEAR MED [address] [place], [place] [address] [phone]    Cardiology Nuclear Med Study  Noah Thompson is a 73 y.o. male     MRN : [id number]     DOB: 1940/08/20  Procedure Date: 10/29/2013  Nuclear Med Background Indication for Stress Test:  Evaluation for Ischemia History:PTVP;Echo 09/19/13  EF:20-25% ;Cardiomyopathy;AVR Cardiac Risk Factors: Family History - CAD, Hypertension, LBBB and Lipids  Symptoms:  SOB   Nuclear Pre-Procedure Caffeine/Decaff Intake:  None NPO After: 12:00am   Lungs:  clear O2 Sat: 98% on room air. IV 0.9% NS with Angio Cath:  20g  IV Site: R Antecubital  IV Started by:  Milana Na, EMT-P  Chest Size (in):  42 Cup Size: n/a  Height: 5' 11.5" (1.816 m)  Weight:  180 lb (81.647 kg)  BMI:  Body mass index is 24.76 kg/(m^2). Tech Comments:  No Rx this am    Nuclear Med Study 1 or 2 day study: 1 day  Stress Test Type:  Adenosine  Reading MD: n/a  Order Authorizing Provider:  J.Allred  MD  Resting Radionuclide: Technetium 58m Sestamibi  Resting Radionuclide Dose: 11.0 mCi   Stress Radionuclide:  Technetium 94m Sestamibi  Stress Radionuclide Dose: 33.0 mCi           Stress Protocol Rest HR: 60 Stress HR: 65  Rest BP: 122/78 Stress BP: 120/74  Exercise Time (min): n/a METS: n/a   Predicted Max HR: 148 bpm % Max HR: 43.92 bpm Rate Pressure Product: 7800   Dose of Adenosine (mg):  45.8 Dose of Lexiscan: n/a mg  Dose of Atropine (mg): n/a Dose of Dobutamine: n/a mcg/kg/min (at max HR)  Stress Test Technologist: Frederick Peers, EMT-P  Nuclear Technologist:  Harlow Asa, CNMT     Rest Procedure:  Myocardial perfusion imaging was performed at rest 45 minutes following the intravenous administration of Technetium 53m Sestamibi. Rest ECG: NSR-LBBB  Stress Procedure:  The patient received IV adenosine at 140 mcg/kg/min for 4 minutes.  Technetium 33m Sestamibi was injected at the 2 minute mark and  quantitative spect images were obtained after a 45 minute delay. Stress ECG: No significant change from baseline ECG  QPS Raw Data Images:  Normal; no motion artifact; normal heart/lung ratio. Stress Images:  There is decreased uptake in the septum. Rest Images:  There is decreased uptake in the septum. Subtraction (SDS):  No evidence of ischemia. Transient Ischemic Dilatation (Normal <1.22):  1.03 Lung/Heart Ratio (Normal <0.45):  0.30  Quantitative Gated Spect Images QGS EDV:  168 ml QGS ESV:  96 ml  Impression Exercise Capacity:  Lexiscan with no exercise. BP Response:  Normal blood pressure response. Clinical Symptoms:  No significant symptoms noted. ECG Impression:  Baseline:  LBBB.  EKG uninterpretable due to LBBB at rest and stress. Comparison with Prior Nuclear Study: No previous nuclear study performed  Overall Impression:  Low risk stress nuclear study with a fixed septal defect that likely represents a LBBB-related artifact. There is no reversible ischemia.  LV Ejection Fraction: 43%.  LV Wall Motion:  Global hypokinesis with prominent paradoxical septal motion.

## 2013-10-30 ENCOUNTER — Telehealth: Payer: Self-pay | Admitting: *Deleted

## 2013-10-30 ENCOUNTER — Encounter: Payer: Self-pay | Admitting: Physician Assistant

## 2013-10-30 ENCOUNTER — Ambulatory Visit (INDEPENDENT_AMBULATORY_CARE_PROVIDER_SITE_OTHER): Payer: Medicare Other | Admitting: Physician Assistant

## 2013-10-30 VITALS — BP 130/70 | HR 61 | Ht 71.5 in | Wt 182.0 lb

## 2013-10-30 DIAGNOSIS — I10 Essential (primary) hypertension: Secondary | ICD-10-CM

## 2013-10-30 DIAGNOSIS — I428 Other cardiomyopathies: Secondary | ICD-10-CM

## 2013-10-30 DIAGNOSIS — I359 Nonrheumatic aortic valve disorder, unspecified: Secondary | ICD-10-CM

## 2013-10-30 DIAGNOSIS — Z954 Presence of other heart-valve replacement: Secondary | ICD-10-CM

## 2013-10-30 DIAGNOSIS — I5022 Chronic systolic (congestive) heart failure: Secondary | ICD-10-CM

## 2013-10-30 MED ORDER — LISINOPRIL 10 MG PO TABS: 10.0000 mg | ORAL_TABLET | Freq: Every day | ORAL | Status: DC

## 2013-10-30 NOTE — Progress Notes (Signed)
[address] [place], [place]  [address] Phone: [phone] Fax:  [phone]  Date:  10/30/2013   ID:  Noah LewLarry F Thompson, DOB 04/14/1941, MRN [id number]  PCP:  Eustaquio BoydenJavier Gutierrez, MD  Cardiologist:  Dr. Hillis RangeJames Allred   Electrophysiologist:  Dr. Hillis RangeJames Allred    History of Present Illness: Noah Thompson is a 73 y.o. male with a hx of aortic stenosis, s/p St. Jude (mechanical) AVR, perioperative AV block s/p pacemaker (SJM), HTN, HL, normal LV function.  He was previously seen by Dr. Juanda ChanceBrodie and established with Dr. Johney FrameAllred in 01/2012.  He is s/p [facility] in 01/2012.  Echo in 06/2012 demonstrated EF 45-50% with mod increased gradient across his aortic valve.  Last seen by Dr. Hillis RangeJames Allred in 07/2013. 1 episode of NSVT was noted on pacer interrogation as well as ATach x 1.  He declined beta blocker Rx at that time.  F/u echo was arranged.   This demonstrated worsening LVF with EF 20-25% and mod AS.   I saw him in followup for/14/15. We reviewed the findings on his echocardiogram. He had no significant chest pain or shortness of breath. I arranged a Lexiscan Myoview to rule out ischemic heart disease as a cause for his cardiomyopathy.  This was negative for ischemia with an EF of 43%. There was a fixed septal defect likely representing LBBB related artifact.  I also started him on Coreg.  He returns for followup. He is feeling much better. He has had no side effects from Coreg. He denies chest pain, shortness of breath, syncope, edema, orthopnea.   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  - Echo (08/2013):  EF 20-25%, AVR with mod stenosis (mean 30 mmHg), mod dilated aorta (root 36 mm), PASP 31 mmHg.  - Nuclear (10/30/13):  Low risk stress nuclear study with a fixed septal defect that likely represents a LBBB-related artifact. There is no reversible ischemia.  LV  Ejection Fraction: 43%   Recent Labs: No results found for requested labs within last 365 days.  Wt Readings from Last 3 Encounters:  10/30/13 182 lb (82.555 kg)  10/29/13 180 lb (81.647 kg)  10/14/13 180 lb (81.647 kg)     Past Medical History  Diagnosis Date  . Hyperlipidemia   . Hypertension   . Depression   . Heart disease     s/p pacer  . Kidney disease     h/o nephritis as young adult  . S/P aortic valve replacement 1993    for aortic stenosis  . 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 History of pneumonia   . Hx of cardiovascular stress test     Adenosine Myoview (10/2013): Fixed septal defect likely representing LBBB-related artifact, no reversible ischemia, EF 43%, low risk    Current Outpatient Prescriptions  Medication Sig Dispense Refill  . acetaminophen (TYLENOL) 500 MG tablet Take 1,000 mg by mouth every 6 (six) hours as needed. For pain or fever      . amLODipine (NORVASC) 10 MG tablet TAKE ONE (1) TABLET BY MOUTH EVERY DAY  90 tablet  2  . carvedilol (COREG) 3.125 MG tablet Take 1 tablet (3.125 mg total) by mouth 2 (two) times daily.  60 tablet  3  . fish oil-omega-3 fatty acids 1000 MG capsule Take 2 g by mouth daily.       .Marland Kitchen  Multiple Vitamin (MULTIVITAMIN WITH MINERALS) TABS Take 1 tablet by mouth as needed.       . ranitidine (ZANTAC) 150 MG capsule Take 150 mg by mouth as needed for heartburn.      . warfarin (COUMADIN) 5 MG tablet TAKE AS DIRECTED BY  COUMADIN  CLINIC  40 tablet  3   No current facility-administered medications for this visit.    Allergies:   Review of patient's allergies indicates no known allergies.   Social History:  The patient  reports that he has never smoked. He has never used smokeless tobacco. He reports that he does not drink alcohol or use illicit drugs.   Family History:  The patient's family history includes Arthritis in his father and mother; Cancer (age of onset: 43) in his father; Heart disease in his father. There is no  history of Coronary artery disease, Stroke, or Diabetes.   ROS:  Please see the history of present illness.     All other systems reviewed and negative.   PHYSICAL EXAM: VS:  BP 130/70  Pulse 61  Ht 5' 11.5" (1.816 m)  Wt 182 lb (82.555 kg)  BMI 25.03 kg/m2 Well nourished, well developed, in no acute distress HEENT: normal Neck: no JVD Cardiac:  normal S1, mechanical S2; RRR; 2/6 systolic murmurat the RUSB Lungs:  clear to auscultation bilaterally, no wheezing, rhonchi or rales Abd: soft, nontender, no hepatomegaly Ext: no edema Skin: warm and dry Neuro:  CNs 2-12 intact, no focal abnormalities noted  EKG:    NSR, HR 61, LBBB     ASSESSMENT AND PLAN:  1. Cardiomyopathy:   Recent Myoview low risk without significant findings to suggest scar or ischemia. EF is actually better on nuclear study than echocardiogram. 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  He is tolerating carvedilol. I will stop his amlodipine and start him on lisinopril 10 mg daily. He will have a basic metabolic panel checked in one week. He will likely need a follow up echocardiogram in the next 2-3 months. 2. Chronic Systolic CHF:  Volume stable. He is not taking any diuretics. 3. Aortic Stenosis s/p Mechanical AVR:  Valve gradient stable by recent measurement. Continue Coumadin. Continue SBE prophylaxis. 4. S/p Pacemaker:  Follow up with EP as planned. 5. Disposition:  Follow up with me in 3 weeks and Dr. Johney Frame in 2 months.Luna Glasgow, PA-C  10/30/2013 3:31 PM

## 2013-10-30 NOTE — Patient Instructions (Signed)
STOP AMLODIPINE   START LISINOPRIL 10 MG DAILY; RX SENT IN TODAY  LAB WORK ON 11/06/13 @ 4:15  PLEASE FOLLOW UP WITH SC[facility] ON 11/25/13 @ 3:40  Your physician recommends that you schedule a follow-up appointment ON 12/31/13 @ 3:45 WITH DR. ALLRED

## 2013-10-30 NOTE — Telephone Encounter (Signed)
pt notified about myoview results with verbal understanding. He has appt today with Bing Neighbors. PA at 3:40. Pt states to me he is feeling much better since Lorin Picket has started him on Coreg, said he can sleep at night now. I said GREAT!

## 2013-11-06 ENCOUNTER — Other Ambulatory Visit (INDEPENDENT_AMBULATORY_CARE_PROVIDER_SITE_OTHER): Payer: Medicare Other

## 2013-11-06 DIAGNOSIS — I428 Other cardiomyopathies: Secondary | ICD-10-CM

## 2013-11-06 DIAGNOSIS — I10 Essential (primary) hypertension: Secondary | ICD-10-CM

## 2013-11-06 DIAGNOSIS — I5022 Chronic systolic (congestive) heart failure: Secondary | ICD-10-CM

## 2013-11-07 LAB — BASIC METABOLIC PANEL
BUN: 19 mg/dL (ref 6–23)
CHLORIDE: 109 meq/L (ref 96–112)
CO2: 28 mEq/L (ref 19–32)
Calcium: 9.4 mg/dL (ref 8.4–10.5)
Creatinine, Ser: 1.2 mg/dL (ref 0.4–1.5)
GFR: 63.1 mL/min (ref >60.00)
Glucose, Bld: 89 mg/dL (ref 70–99)
POTASSIUM: 4.3 meq/L (ref 3.5–5.1)
SODIUM: 142 meq/L (ref 135–145)

## 2013-11-10 ENCOUNTER — Telehealth: Payer: Self-pay | Admitting: *Deleted

## 2013-11-10 NOTE — Telephone Encounter (Signed)
pt notified about lab results with verbal understanding  

## 2013-11-20 ENCOUNTER — Encounter: Payer: Self-pay | Admitting: Cardiology

## 2013-11-25 ENCOUNTER — Ambulatory Visit: Payer: Medicare Other | Admitting: Physician Assistant

## 2013-11-28 ENCOUNTER — Ambulatory Visit: Payer: Medicare Other | Admitting: Physician Assistant

## 2013-12-22 ENCOUNTER — Telehealth: Payer: Self-pay | Admitting: Physician Assistant

## 2013-12-22 DIAGNOSIS — I10 Essential (primary) hypertension: Secondary | ICD-10-CM

## 2013-12-22 NOTE — Telephone Encounter (Signed)
New problem    Pt has a rash due to taking Lisinopril. Please call pt.

## 2013-12-23 MED ORDER — LISINOPRIL 10 MG PO TABS: ORAL_TABLET | ORAL | Status: DC

## 2013-12-23 MED ORDER — AMLODIPINE BESYLATE 10 MG PO TABS: 10.0000 mg | ORAL_TABLET | Freq: Every day | ORAL | Status: DC

## 2013-12-23 NOTE — Telephone Encounter (Signed)
pt notified per Dr. Katrinka Blazing DOD to hold lisinopril x 1 week and re-start norvasc 10 mg daily x 1 week. Pt has f/u 12/31/13 with Dr. Johney Frame. Pt advised tcb Friday if rash is not getting better or has gotten worse, pt said ok and thank you for my help today.

## 2013-12-23 NOTE — Telephone Encounter (Signed)
s/w pt and his wife today about rash on his arms. Pt states he feels this is from the Lisinopril, since a few days after he started lisnopril rash started. Pt using neosporin and calmoseptine ointment x 4 weeks to see if rash would get better. I asked pt if this was like the incident in 05/2013 when he had scabies or ? Contact dermatitis. Pt said no this is different. Pt states rash starts calming down during the day because he is using the creams, but states he takes the lisinopril about 5 pm everyday and the rash flares back up. I told pt I will d/w DOD (Dr. Katrinka Blazing) today for his recommendations and I will call back later today. Pt said ok and thank you for my help.

## 2013-12-31 ENCOUNTER — Encounter: Payer: Self-pay | Admitting: Internal Medicine

## 2013-12-31 ENCOUNTER — Ambulatory Visit (INDEPENDENT_AMBULATORY_CARE_PROVIDER_SITE_OTHER): Payer: Medicare Other | Admitting: Internal Medicine

## 2013-12-31 VITALS — BP 156/89 | HR 59 | Ht 71.5 in | Wt 180.0 lb

## 2013-12-31 DIAGNOSIS — I472 Ventricular tachycardia, unspecified: Secondary | ICD-10-CM

## 2013-12-31 DIAGNOSIS — Z954 Presence of other heart-valve replacement: Secondary | ICD-10-CM | POA: Diagnosis not present

## 2013-12-31 DIAGNOSIS — I441 Atrioventricular block, second degree: Secondary | ICD-10-CM

## 2013-12-31 DIAGNOSIS — I10 Essential (primary) hypertension: Secondary | ICD-10-CM

## 2013-12-31 DIAGNOSIS — I428 Other cardiomyopathies: Secondary | ICD-10-CM

## 2013-12-31 DIAGNOSIS — I4729 Other ventricular tachycardia: Secondary | ICD-10-CM | POA: Diagnosis not present

## 2013-12-31 MED ORDER — LOSARTAN POTASSIUM 25 MG PO TABS: 25.0000 mg | ORAL_TABLET | Freq: Every day | ORAL | Status: DC

## 2013-12-31 NOTE — Patient Instructions (Addendum)
  Your physician wants you to follow-up in: 3 months with Tereso Newcomer, PAand 6 months with Dr Jacquiline Doe will receive a reminder letter in the mail two months in advance. If you don't receive a letter, please call our office to schedule the follow-up appointment. Remote monitoring is used to monitor your Pacemaker or ICD from home. This monitoring reduces the number of office visits required to check your device to one time per year. It allows [place] to keep an eye on the functioning of your device to ensure it is working properly. You are scheduled for a device check from home on 04/06/14 You may send your transmission at any time that day. If you have a wireless device, the transmission will be sent automatically. After your physician reviews your transmission, you will receive a postcard with your next transmission date.     Your physician recommends that you schedule a follow-up appointment in: 10 days nurse room for BP check and labs: BMP  Your physician has recommended you make the following change in your medication:  1) Start Losartan 25mg

## 2014-01-05 DIAGNOSIS — I428 Other cardiomyopathies: Secondary | ICD-10-CM | POA: Insufficient documentation

## 2014-01-05 LAB — MDC_IDC_ENUM_SESS_TYPE_INCLINIC
Brady Statistic RA Percent Paced: 76 %
Implantable Pulse Generator Serial Number: 7364115
Lead Channel Impedance Value: 450 Ohm
Lead Channel Impedance Value: 525 Ohm
Lead Channel Pacing Threshold Amplitude: 0.75 V
Lead Channel Pacing Threshold Amplitude: 0.75 V
Lead Channel Pacing Threshold Pulse Width: 0.5 ms
Lead Channel Pacing Threshold Pulse Width: 0.5 ms
Lead Channel Sensing Intrinsic Amplitude: 10.5 mV
Lead Channel Sensing Intrinsic Amplitude: 4.2 mV
Lead Channel Setting Pacing Amplitude: 2 V
Lead Channel Setting Pacing Amplitude: 2.5 V
Lead Channel Setting Pacing Pulse Width: 0.5 ms
Lead Channel Setting Sensing Sensitivity: 2 mV
MDC IDC MSMT BATTERY REMAINING LONGEVITY: 102 mo
MDC IDC MSMT BATTERY VOLTAGE: 2.93 V
MDC IDC MSMT LEADCHNL RA PACING THRESHOLD AMPLITUDE: 0.75 V
MDC IDC MSMT LEADCHNL RA PACING THRESHOLD PULSEWIDTH: 0.5 ms
MDC IDC MSMT LEADCHNL RA PACING THRESHOLD PULSEWIDTH: 0.5 ms
MDC IDC MSMT LEADCHNL RV PACING THRESHOLD AMPLITUDE: 0.75 V
MDC IDC SESS DTM: 20150701200128
MDC IDC STAT BRADY RV PERCENT PACED: 0.8 %

## 2014-01-05 NOTE — Progress Notes (Signed)
PCP:  [name], MD  The patient presents today for routine electrophysiology followup.  Since last being seen in our clinic, the patient reports doing very well.  He recently stopped lisinopril due to rash.  His rash has resolved.  Today, he denies symptoms of palpitations, chest pain, shortness of breath, orthopnea, PND, lower extremity edema, dizziness, presyncope, syncope, or neurologic sequela.  The patient feels that he is tolerating medications without difficulties and is otherwise without complaint today.   Past Medical History  Diagnosis Date  . Hyperlipidemia   . Hypertension   . Depression   . Heart disease     s/p pacer  . Kidney disease     h/o nephritis as young adult  . S/P aortic valve replacement 1993    for aortic stenosis  . 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 History of pneumonia   . Hx of cardiovascular stress test     Adenosine Myoview (10/2013): Fixed septal defect likely representing LBBB-related artifact, no reversible ischemia, EF 43%, low risk   Past Surgical History  Procedure Laterality Date  . Aortic valve replacement  1993    St. Jude Mechanical Valve  . Pacemaker insertion  1993    for AV block, not pacemaker dependant  . Anal fissure repair  1970s    Current Outpatient Prescriptions  Medication Sig Dispense Refill  . acetaminophen (TYLENOL) 500 MG tablet Take 1,000 mg by mouth every 6 (six) hours as needed. For pain or fever      . amLODipine (NORVASC) 10 MG tablet Take 1 tablet (10 mg total) by mouth daily.      . carvedilol (COREG) 3.125 MG tablet Take 1 tablet (3.125 mg total) by mouth 2 (two) times daily.  60 tablet  3  . fish oil-omega-3 fatty acids 1000 MG capsule Take 2 g by mouth daily.       . Multiple Vitamin (MULTIVITAMIN WITH MINERALS) TABS Take 1 tablet by mouth as needed.       . ranitidine (ZANTAC) 150 MG capsule Take 150 mg by mouth as needed for heartburn.      . warfarin (COUMADIN) 5 MG tablet TAKE AS DIRECTED BY  COUMADIN   CLINIC  40 tablet  3  . losartan (COZAAR) 25 MG tablet Take 1 tablet (25 mg total) by mouth daily.  30 tablet  11   No current facility-administered medications for this visit.   ROS- all systems reviewed and negative except as per HPI above  Allergies  Allergen Reactions  . Lisinopril Rash    History   Social History  . Marital Status: Married    Spouse Name: N/A    Number of Children: N/A  . Years of Education: N/A   Occupational History  . Not on file.   Social History Main Topics  . Smoking status: Never Smoker   . Smokeless tobacco: Never Used  . Alcohol Use: No  . Drug Use: No  . Sexual Activity: Not on file   Other Topics Concern  . Not on file   Social History Narrative   Lives with wife, 5 dogs.  grown children away.   Occupation: retired, was Teaching laboratory technician   Activity: welding    Family History  Problem Relation Age of Onset  . Arthritis Mother   . Arthritis Father   . Heart disease Father     CHF  . Coronary artery disease Neg Hx   . Stroke Neg Hx   .  Diabetes Neg Hx   . Cancer Father [id number]    prostate    ROS-  All systems are reviewed and are negative except as outlined in the HPI above   Physical Exam: Filed Vitals:   12/31/13 1552  BP: 156/89  Pulse: 59  Height: 5' 11.5" (1.816 m)  Weight: 180 lb (81.647 kg)    GEN- The patient is well appearing, alert and oriented x 3 today.   Head- normocephalic, atraumatic Eyes-  Sclera clear, conjunctiva pink Ears- hearing intact Oropharynx- clear Neck- supple, no JVP Lymph- no cervical lymphadenopathy Lungs- Clear to ausculation bilaterally, normal work of breathing Chest- pacemaker pocket is well healed Heart- Regular rate and rhythm, mechanical S2, 2/6 SEM LUSB GI- soft, NT, ND, + BS Extremities- no clubbing, cyanosis, or edema  Pacemaker interrogation- reviewed in detail today,  See PACEART report Recent echo and myoview were reviewed with the patient  Assessment and Plan:  1, Second  degree AV block Normal pacemaker function See Pace Art report No changes today He V paces all of the time. Today we discussed upgrade to CRT.  He is clear that he wishes to avoid this if able. I have therefore emphasized the importance of medical management of his cardiomyopathy.  2. VT He has a h/o asymptomatic NSVT.  He declines upgrade (as above) Continue to titrate beta blockers as BP allows.  3. HTN Above goal today Add losartan Repeat bmet in 10 days  4. HL He wishes to have lipids followed by PCP  5. Mechanical aortic valve Continue coumadin Moderate stenosis by echo  6. Nonischemic CM Continue to optimize medication Added losartan today He will follow-up with Tereso NewcomerScott Weaver for further medicine titration I will see in 6 months  carelink

## 2014-01-15 ENCOUNTER — Ambulatory Visit: Payer: PRIVATE HEALTH INSURANCE | Admitting: Family Medicine

## 2014-01-23 ENCOUNTER — Encounter: Payer: Self-pay | Admitting: Family Medicine

## 2014-01-23 ENCOUNTER — Ambulatory Visit (INDEPENDENT_AMBULATORY_CARE_PROVIDER_SITE_OTHER): Payer: Medicare Other | Admitting: Family Medicine

## 2014-01-23 VITALS — BP 128/80 | HR 60 | Temp 97.4°F | Wt 180.0 lb

## 2014-01-23 DIAGNOSIS — E785 Hyperlipidemia, unspecified: Secondary | ICD-10-CM

## 2014-01-23 DIAGNOSIS — Z954 Presence of other heart-valve replacement: Secondary | ICD-10-CM

## 2014-01-23 DIAGNOSIS — I10 Essential (primary) hypertension: Secondary | ICD-10-CM

## 2014-01-23 DIAGNOSIS — Z7901 Long term (current) use of anticoagulants: Secondary | ICD-10-CM

## 2014-01-23 NOTE — Assessment & Plan Note (Signed)
On fish oil 2 g daily. Last ate at 10am - check FLP today.

## 2014-01-23 NOTE — Patient Instructions (Signed)
Blood work today, coumadin check today. Then call coumadin clinic to schedule follow up visit. Continue medicines as up to now. Return as needed or in 6 months for medicare wellness visit.

## 2014-01-23 NOTE — Assessment & Plan Note (Signed)
Great control based on bp today. Tolerating losartan. Check Cr today and TSH.

## 2014-01-23 NOTE — Assessment & Plan Note (Signed)
Check coumadin as overdue - then recommended he return to see coumadin clinic.

## 2014-01-23 NOTE — Progress Notes (Signed)
BP 128/80  Pulse 60  Temp(Src) 97.4 F (36.3 C) (Tympanic)  Wt 180 lb (81.647 kg)  SpO2 98%   CC: f/u cards  Subjective:    Patient ID: Noah Thompson, male    DOB: 06/13/1941, 73 y.o.   MRN: [id number]  HPI: ROBERTJAMES EISEL is a 73 y.o. male presenting on 01/23/2014 for Follow-up   Presents today from referral from Dr. Johney Frame. Latest cards note reviewed. HTN - complaint with 3 meds - losartan 25mg  daily, coreg 3.125mg  bid, and amlodipine 10mg  daily. Recently started on losartan. Due for Cr check.  No HA, vision changes, CP/tightness, SOB, leg swelling.  HLD - only on fish oil daily. Due for FLP.  Coumadin not checked since 10/2013. Lab Results  Component Value Date   INR 3.4 10/14/2013   INR 3.4 09/04/2013   INR 3.6 07/24/2013   PROTIME 19.9 12/07/2008    BP Readings from Last 3 Encounters:  01/23/14 128/80  12/31/13 156/89  10/30/13 130/70     New rash on arms that is resolving with OTC cortisone.   Relevant past medical, surgical, family and social history reviewed and updated as indicated.  Allergies and medications reviewed and updated. Current Outpatient Prescriptions on File Prior to Visit  Medication Sig  . acetaminophen (TYLENOL) 500 MG tablet Take 1,000 mg by mouth every 6 (six) hours as needed. For pain or fever  . amLODipine (NORVASC) 10 MG tablet Take 1 tablet (10 mg total) by mouth daily.  . carvedilol (COREG) 3.125 MG tablet Take 1 tablet (3.125 mg total) by mouth 2 (two) times daily.  . fish oil-omega-3 fatty acids 1000 MG capsule Take 2 g by mouth daily.   Marland Kitchen losartan (COZAAR) 25 MG tablet Take 1 tablet (25 mg total) by mouth daily.  . Multiple Vitamin (MULTIVITAMIN WITH MINERALS) TABS Take 1 tablet by mouth as needed.   . ranitidine (ZANTAC) 150 MG capsule Take 150 mg by mouth as needed for heartburn.  . warfarin (COUMADIN) 5 MG tablet TAKE AS DIRECTED BY  COUMADIN  CLINIC   No current facility-administered medications on file prior to visit.     Review of Systems Per HPI unless specifically indicated above    Objective:    BP 128/80  Pulse 60  Temp(Src) 97.4 F (36.3 C) (Tympanic)  Wt 180 lb (81.647 kg)  SpO2 98%  Physical Exam  Nursing note and vitals reviewed. Constitutional: He appears well-developed and well-nourished. No distress.  HENT:  Mouth/Throat: Oropharynx is clear and moist. No oropharyngeal exudate.  Neck: No thyromegaly present.  Cardiovascular: Normal rate, regular rhythm and intact distal pulses.   Murmur (2/6 SEM) heard. Pulmonary/Chest: Effort normal and breath sounds normal. No respiratory distress. He has no wheezes. He has no rales.  Musculoskeletal: He exhibits no edema.      Assessment & Plan:  I've asked patient to return in 6 mo for medicare wellness visit   Problem List Items Addressed This Visit   HYPERLIPIDEMIA     On fish oil 2 g daily. Last ate at 10am - check FLP today.    Relevant Orders      Lipid panel   Essential hypertension, benign - Primary     Great control based on bp today. Tolerating losartan. Check Cr today and TSH.    Relevant Orders      TSH      Basic metabolic panel   AORTIC VALVE REPLACEMENT, HX OF     Check coumadin as  overdue - then recommended he return to see coumadin clinic.        Follow up plan: Return in about 6 months (around 07/26/2014), or as needed, for medicare wellness.

## 2014-01-24 LAB — PROTIME-INR
INR: 3.9 — AB (ref <1.50)
Prothrombin Time: 38.2 seconds — ABNORMAL HIGH (ref 11.6–15.2)

## 2014-01-24 LAB — BASIC METABOLIC PANEL
BUN: 17 mg/dL (ref 6–23)
CO2: 24 mEq/L (ref 19–32)
Calcium: 9.2 mg/dL (ref 8.4–10.5)
Chloride: 107 mEq/L (ref 96–112)
Creat: 1 mg/dL (ref 0.50–1.35)
GLUCOSE: 90 mg/dL (ref 70–99)
POTASSIUM: 4.1 meq/L (ref 3.5–5.3)
SODIUM: 140 meq/L (ref 135–145)

## 2014-01-24 LAB — LIPID PANEL
CHOL/HDL RATIO: 4.4 ratio
Cholesterol: 203 mg/dL — ABNORMAL HIGH (ref 0–200)
HDL: 46 mg/dL (ref >39)
LDL Cholesterol: 129 mg/dL — ABNORMAL HIGH (ref 0–99)
TRIGLYCERIDES: 142 mg/dL (ref <150)
VLDL: 28 mg/dL (ref 0–40)

## 2014-01-24 LAB — TSH: TSH: 1.124 u[IU]/mL (ref 0.350–4.500)

## 2014-01-26 ENCOUNTER — Encounter: Payer: Self-pay | Admitting: *Deleted

## 2014-01-26 ENCOUNTER — Ambulatory Visit (INDEPENDENT_AMBULATORY_CARE_PROVIDER_SITE_OTHER): Payer: Medicare Other | Admitting: Cardiology

## 2014-01-26 DIAGNOSIS — Z954 Presence of other heart-valve replacement: Secondary | ICD-10-CM

## 2014-01-26 DIAGNOSIS — Z5181 Encounter for therapeutic drug level monitoring: Secondary | ICD-10-CM

## 2014-02-08 ENCOUNTER — Other Ambulatory Visit: Payer: Self-pay | Admitting: Physician Assistant

## 2014-02-23 ENCOUNTER — Other Ambulatory Visit: Payer: Self-pay | Admitting: Internal Medicine

## 2014-03-03 ENCOUNTER — Ambulatory Visit (INDEPENDENT_AMBULATORY_CARE_PROVIDER_SITE_OTHER): Payer: Medicare Other | Admitting: *Deleted

## 2014-03-03 DIAGNOSIS — Z954 Presence of other heart-valve replacement: Secondary | ICD-10-CM

## 2014-03-03 DIAGNOSIS — Z5181 Encounter for therapeutic drug level monitoring: Secondary | ICD-10-CM

## 2014-03-03 LAB — POCT INR: INR: 4.4

## 2014-03-25 ENCOUNTER — Ambulatory Visit (INDEPENDENT_AMBULATORY_CARE_PROVIDER_SITE_OTHER): Payer: Medicare Other | Admitting: Pharmacist

## 2014-03-25 DIAGNOSIS — Z5181 Encounter for therapeutic drug level monitoring: Secondary | ICD-10-CM

## 2014-03-25 DIAGNOSIS — Z954 Presence of other heart-valve replacement: Secondary | ICD-10-CM

## 2014-03-25 LAB — POCT INR: INR: 3.3

## 2014-03-27 ENCOUNTER — Other Ambulatory Visit: Payer: Self-pay | Admitting: Nurse Practitioner

## 2014-03-27 ENCOUNTER — Telehealth: Payer: Self-pay | Admitting: Nurse Practitioner

## 2014-03-27 MED ORDER — METOPROLOL SUCCINATE ER 25 MG PO TB24: 25.0000 mg | ORAL_TABLET | Freq: Every day | ORAL | Status: DC

## 2014-03-27 NOTE — Telephone Encounter (Signed)
Pt called this evening.  He has been on Coreg 3.125mg  BID since the spring.  He says that as long as he has been on coreg, following each dose, a rash would flare up on his arms and become itchy.  He has tolerated for several months but it was quite itchy tonight and he'd like some advice.  I recommended that he hold his coreg tonight and tomorrow.  Pt has LV dysfxn/NICM, NSVT, as well as a PPM.  I've sent in a Rx for Toprol XL 25mg  daily to his local pharmacy and I've asked that he start that on Sunday.  He verbalized understanding and will call back if rash recurs on Toprol.

## 2014-04-02 ENCOUNTER — Telehealth: Payer: Self-pay | Admitting: Internal Medicine

## 2014-04-02 NOTE — Telephone Encounter (Signed)
New message      Pt is allergic to carvedilol---it is causing a rash.  Can we call in something for the rash to [facility] pharmacy.  He has stopped the carvedilol since Friday but the rash is still there.  Please call wife when it has been called in.

## 2014-04-02 NOTE — Telephone Encounter (Signed)
He was told on 9/25 to stop Careg and start Toprol 25 mg daily.  He did this and one week ;ater the rash is still there.  He says it is getting better but not gone.  I have asked him to try Benadryl and continue the hydrocortisone cream.  He has a device visit on 04/06/14 and Dr Johney Frame is here that day.  He could have Dr Johney Frame look at the rash if still there.  He will call me back if the Benadryl helps and cancel 04/06/14 appointment and get his device checked at his appointment with Tereso Newcomer 10/14

## 2014-04-02 NOTE — Telephone Encounter (Signed)
Spoke with patients

## 2014-04-06 ENCOUNTER — Encounter: Payer: Self-pay | Admitting: Internal Medicine

## 2014-04-06 ENCOUNTER — Ambulatory Visit (INDEPENDENT_AMBULATORY_CARE_PROVIDER_SITE_OTHER): Payer: Medicare Other | Admitting: *Deleted

## 2014-04-06 DIAGNOSIS — I441 Atrioventricular block, second degree: Secondary | ICD-10-CM

## 2014-04-06 LAB — MDC_IDC_ENUM_SESS_TYPE_REMOTE
Battery Remaining Longevity: 77 mo
Battery Remaining Percentage: 67 %
Brady Statistic AP VS Percent: 80 %
Brady Statistic AS VS Percent: 19 %
Brady Statistic RV Percent Paced: 1 %
Implantable Pulse Generator Model: 2210
Lead Channel Impedance Value: 440 Ohm
Lead Channel Pacing Threshold Amplitude: 0.75 V
Lead Channel Pacing Threshold Amplitude: 0.75 V
Lead Channel Pacing Threshold Pulse Width: 0.5 ms
Lead Channel Sensing Intrinsic Amplitude: 12 mV
Lead Channel Setting Pacing Amplitude: 2 V
Lead Channel Setting Pacing Amplitude: 2.5 V
Lead Channel Setting Pacing Pulse Width: 0.5 ms
MDC IDC MSMT BATTERY VOLTAGE: 2.93 V
MDC IDC MSMT LEADCHNL RA PACING THRESHOLD PULSEWIDTH: 0.5 ms
MDC IDC MSMT LEADCHNL RA SENSING INTR AMPL: 3.8 mV
MDC IDC MSMT LEADCHNL RV IMPEDANCE VALUE: 480 Ohm
MDC IDC PG SERIAL: 7364115
MDC IDC SESS DTM: 20151005072245
MDC IDC SET LEADCHNL RV SENSING SENSITIVITY: 2 mV
MDC IDC STAT BRADY AP VP PERCENT: 1 %
MDC IDC STAT BRADY AS VP PERCENT: 1 %
MDC IDC STAT BRADY RA PERCENT PACED: 80 %

## 2014-04-06 NOTE — Progress Notes (Signed)
Remote pacemaker transmission.   

## 2014-04-08 ENCOUNTER — Encounter: Payer: Self-pay | Admitting: Cardiology

## 2014-04-15 ENCOUNTER — Ambulatory Visit (INDEPENDENT_AMBULATORY_CARE_PROVIDER_SITE_OTHER): Payer: Medicare Other | Admitting: Pharmacist

## 2014-04-15 ENCOUNTER — Ambulatory Visit (INDEPENDENT_AMBULATORY_CARE_PROVIDER_SITE_OTHER): Payer: Medicare Other | Admitting: Physician Assistant

## 2014-04-15 ENCOUNTER — Encounter: Payer: Self-pay | Admitting: Physician Assistant

## 2014-04-15 VITALS — BP 150/90 | HR 72 | Ht 71.5 in | Wt 184.0 lb

## 2014-04-15 DIAGNOSIS — Z5181 Encounter for therapeutic drug level monitoring: Secondary | ICD-10-CM

## 2014-04-15 DIAGNOSIS — Z954 Presence of other heart-valve replacement: Secondary | ICD-10-CM

## 2014-04-15 DIAGNOSIS — Z952 Presence of prosthetic heart valve: Secondary | ICD-10-CM

## 2014-04-15 DIAGNOSIS — I4729 Other ventricular tachycardia: Secondary | ICD-10-CM

## 2014-04-15 DIAGNOSIS — I472 Ventricular tachycardia, unspecified: Secondary | ICD-10-CM

## 2014-04-15 DIAGNOSIS — Z23 Encounter for immunization: Secondary | ICD-10-CM

## 2014-04-15 DIAGNOSIS — I428 Other cardiomyopathies: Secondary | ICD-10-CM

## 2014-04-15 DIAGNOSIS — I429 Cardiomyopathy, unspecified: Secondary | ICD-10-CM

## 2014-04-15 DIAGNOSIS — Z950 Presence of cardiac pacemaker: Secondary | ICD-10-CM

## 2014-04-15 DIAGNOSIS — I10 Essential (primary) hypertension: Secondary | ICD-10-CM

## 2014-04-15 DIAGNOSIS — I5022 Chronic systolic (congestive) heart failure: Secondary | ICD-10-CM

## 2014-04-15 LAB — POCT INR: INR: 4

## 2014-04-15 MED ORDER — METOPROLOL SUCCINATE ER 50 MG PO TB24: 50.0000 mg | ORAL_TABLET | Freq: Every day | ORAL | Status: DC

## 2014-04-15 NOTE — Patient Instructions (Signed)
INCREASE TOPROL XL TO 50 MG DAILY; NEW RX SENT IN FOR THE 50 MG TABLET  Your physician recommends that you schedule a follow-up appointment in: 3 MONTHS DR. ALLRED

## 2014-04-15 NOTE — Progress Notes (Signed)
Cardiology Office Note   Date:  04/15/2014   ID:  Noah Thompson, DOB [birth year], MRN [id number]  PCP:  Eustaquio BoydenJavier Gutierrez, MD  Cardiologist/Electrophysiologist:  Dr. Hillis RangeJames Allred      History of Present Illness: Noah Thompson is a 73 y.o. male with a hx of aortic stenosis, s/p St. Jude (mechanical) AVR, perioperative AV block s/p pacemaker (SJM), HTN, HL, normal LV function.  He was previously seen by Dr. Juanda ChanceBrodie and established with Dr. Johney FrameAllred in 01/2012.   Echo in 06/2012 demonstrated EF 45-50% with mod increased gradient across his aortic valve.  Patient had 1 episode of NSVT noted on pacer interrogation as well as ATach x 1 in 07/2013.  Follow up echo demonstrated worsening LVF with EF 20-25% and mod AS.  Lexiscan Myoview was negative for ischemia with an EF of 43%. There was a fixed septal defect likely representing LBBB related artifact.     Last seen by Dr. Johney FrameAllred 7/15. He returns for further medication titration. Unfortunately, the patient developed a rash. He called in to the answering service and was taken off of carvedilol. He has been on Toprol-XL for the last 2-3 weeks. His rash is improved.  The patient denies any chest pain, significant dyspnea, syncope, orthopnea, PND, edema.   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  - Echo (08/2013):  EF 20-25%, AVR with mod stenosis (mean 30 mmHg), mod dilated aorta (root 36 mm), PASP 31 mmHg.  - Nuclear (10/30/13):  Low risk stress nuclear study with a fixed septal defect that likely represents a LBBB-related artifact. There is no reversible ischemia.  LV Ejection Fraction: 43%   Recent Labs: 01/23/2014: Creatinine 1.00; HDL Cholesterol by NMR 46; LDL (calc) 129*; Potassium 4.1; TSH 1.124   Wt Readings from Last 3 Encounters:  04/15/14 184 lb (83.462 kg)  01/23/14 180 lb (81.647 kg)  12/31/13 180 lb (81.647 kg)     Past Medical  History  Diagnosis Date  . Hyperlipidemia   . Hypertension   . Depression   . Heart disease     s/p pacer  . Kidney disease     h/o nephritis as young adult  . S/P aortic valve replacement 1993    for aortic stenosis  . 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 History of pneumonia   . Hx of cardiovascular stress test     Adenosine Myoview (10/2013): Fixed septal defect likely representing LBBB-related artifact, no reversible ischemia, EF 43%, low risk    Current Outpatient Prescriptions  Medication Sig Dispense Refill  . acetaminophen (TYLENOL) 500 MG tablet Take 1,000 mg by mouth every 6 (six) hours as needed. For pain or fever      . fish oil-omega-3 fatty acids 1000 MG capsule Take 2 g by mouth daily. TAKE 2000 DALIY      . losartan (COZAAR) 25 MG tablet Take 1 tablet (25 mg total) by mouth daily.  30 tablet  11  . metoprolol succinate (TOPROL-XL) 25 MG 24 hr tablet Take 1 tablet (25 mg total) by mouth daily. Take with or immediately following a meal.  30 tablet  6  . Multiple Vitamin (MULTIVITAMIN WITH MINERALS) TABS Take 1 tablet by mouth as needed.       . ranitidine (ZANTAC) 150 MG capsule Take 150 mg by mouth as needed for heartburn.      .Marland Kitchen  warfarin (COUMADIN) 5 MG tablet TAKE AS DIRECTED BY COUMADIN CLINIC  40 tablet  3   No current facility-administered medications for this visit.    Allergies:   Lisinopril   Social History:  The patient  reports that he has never smoked. He has never used smokeless tobacco. He reports that he does not drink alcohol or use illicit drugs.   Family History:  The patient's family history includes Arthritis in his father and mother; Cancer (age of onset: 5) in his father; Heart disease in his father; Hypertension in his father and mother. There is no history of Coronary artery disease, Stroke, or Diabetes.   ROS:  Please see the history of present illness.     All other systems reviewed and negative.   PHYSICAL EXAM: VS:  BP 150/90  Pulse 72   Ht 5' 11.5" (1.816 m)  Wt 184 lb (83.462 kg)  BMI 25.31 kg/m2 Well nourished, well developed, in no acute distress HEENT: normal Neck: no JVD Cardiac:  normal S1, mechanical S2; RRR; 2/6 systolic murmurat the RUSB Lungs:  clear to auscultation bilaterally, no wheezing, rhonchi or rales Abd: soft, nontender, no hepatomegaly Ext: no edema Skin: warm and dry Neuro:  CNs 2-12 intact, no focal abnormalities noted  EKG:    Atrial paced, HR 72, LBBB    ASSESSMENT AND PLAN:  1. Cardiomyopathy:   Probably nonischemic. EF by echocardiogram 3/15 20-25%. EF by nuclear study 4/15 43%.  Patient has declined upgrade to CRT. Continue current dose of losartan. Increase Toprol-XL to 50 mg daily. 2. Rash: This was likely related to carvedilol. He seems to be tolerating Toprol-XL. 3. HTN:  Above goal.  Increase Toprol-XL as noted above. 4. NSVT:  Continue beta blocker.   5. Chronic Systolic CHF:  Volume stable. He is not taking any diuretics. 6. Aortic Stenosis s/p Mechanical AVR:  Valve gradient stable by recent measurement. Continue Coumadin. Continue SBE prophylaxis. 7. S/p Pacemaker:  Follow up with EP as planned.  Disposition:  Follow up with  Dr. Johney Frame in 3 months.Leonides Schanz, Brynda Rim, MHS 04/15/2014 5:04 PM    [facility] [address] [place], [place], [place]  [address] Phone: [phone]; Fax: [phone]

## 2014-04-23 ENCOUNTER — Ambulatory Visit: Payer: Medicare Other | Admitting: Nurse Practitioner

## 2014-04-23 ENCOUNTER — Telehealth: Payer: Self-pay

## 2014-04-23 ENCOUNTER — Encounter: Payer: Self-pay | Admitting: Nurse Practitioner

## 2014-04-23 ENCOUNTER — Telehealth: Payer: Self-pay | Admitting: Internal Medicine

## 2014-04-23 DIAGNOSIS — Z952 Presence of prosthetic heart valve: Secondary | ICD-10-CM | POA: Insufficient documentation

## 2014-04-23 DIAGNOSIS — I442 Atrioventricular block, complete: Secondary | ICD-10-CM | POA: Insufficient documentation

## 2014-04-23 NOTE — Telephone Encounter (Signed)
Patient report being Sob past 2 weeks or so, he thinks ever since increasing Metoprolol to 50 mg daily. He says

## 2014-04-23 NOTE — Telephone Encounter (Signed)
Patient could not keep appointment today, but admitted that he has nasal congestion and runny nose. No fever, chills. Denies chest congestion or cough. Advised saline nasal spray and mucinex without decongestants. Advised him also to call his PCP for appointment.

## 2014-04-23 NOTE — Telephone Encounter (Signed)
New Message  Pt called to cancel same day appt.. Request to speak with Nurse linda. He says there is something that he needs to tell her that he is unable to fully disclose.

## 2014-04-23 NOTE — Telephone Encounter (Signed)
He states it is worse with inactivity. No chest pain. Awakened him in the mid of the night . Per Ward Givens pA, he can come in this pm.

## 2014-04-24 ENCOUNTER — Encounter: Payer: Self-pay | Admitting: Family Medicine

## 2014-04-24 ENCOUNTER — Ambulatory Visit (INDEPENDENT_AMBULATORY_CARE_PROVIDER_SITE_OTHER): Payer: Medicare Other | Admitting: Family Medicine

## 2014-04-24 VITALS — BP 148/95 | HR 72 | Temp 98.1°F | Ht 71.5 in | Wt 183.0 lb

## 2014-04-24 DIAGNOSIS — J069 Acute upper respiratory infection, unspecified: Secondary | ICD-10-CM

## 2014-04-24 DIAGNOSIS — B9789 Other viral agents as the cause of diseases classified elsewhere: Principal | ICD-10-CM

## 2014-04-24 NOTE — Patient Instructions (Signed)
Likely a viral upper respiratory infection.  Mucinex DM twice daily. Avoid decongestants. Start nasal steroid spray flonase 2 sprays per nostril daily. Nasal saline irrigation 1-2 times a day. Call PCP if not improving in next 48-72 hours. Call sooner if increasing shortness breath.

## 2014-04-24 NOTE — Progress Notes (Signed)
Pre visit review using our clinic review tool, if applicable. No additional management support is needed unless otherwise documented below in the visit note. 

## 2014-04-24 NOTE — Progress Notes (Signed)
   Subjective:    Patient ID: Noah Thompson, male    DOB: 08/28/1940, 73 y.o.   MRN: [id number]  HPI Comments: HX of complete heart block, cardiomyopathy, Av block, aortic valve replacement, pacemaker.  On coumadin.. Last inr at cardiology 4.0 on 10/14. Started recently on metoprolol for slowing heart down. He has been fatigue.   Cough This is a new problem. The current episode started in the past 7 days. The problem has been gradually worsening. The cough is productive of sputum. Associated symptoms include nasal congestion and shortness of breath. Pertinent negatives include no ear congestion, ear pain, fever, myalgias, postnasal drip, sore throat or wheezing. Associated symptoms comments:  Facial sinus pain  no increase in swelling.  only SOB at night, not using pillows to prop up. . Risk factors: nonsmoker. Treatments tried: mucinex, tylenol. His past medical history is significant for pneumonia. There is no history of asthma, bronchiectasis, bronchitis, COPD, emphysema or environmental allergies.  Shortness of Breath Pertinent negatives include no ear pain, fever, sore throat or wheezing. His past medical history is significant for pneumonia. There is no history of asthma or COPD.    Had flu shot 1` week ago.  BP Readings from Last 3 Encounters:  04/24/14 148/95  04/15/14 150/90  01/23/14 128/80     Review of Systems  Constitutional: Negative for fever.  HENT: Negative for ear pain, postnasal drip and sore throat.   Respiratory: Positive for cough and shortness of breath. Negative for wheezing.   Musculoskeletal: Negative for myalgias.  Allergic/Immunologic: Negative for environmental allergies.       Objective:   Physical Exam  Constitutional: Vital signs are normal. He appears well-developed and well-nourished.  Non-toxic appearance. He does not appear ill. No distress.  HENT:  Head: Normocephalic and atraumatic.  Right Ear: Hearing, tympanic membrane, external ear  and ear canal normal. No tenderness. No foreign bodies. Tympanic membrane is not retracted and not bulging.  Left Ear: Hearing, tympanic membrane, external ear and ear canal normal. No tenderness. No foreign bodies. Tympanic membrane is not retracted and not bulging.  Nose: Mucosal edema and rhinorrhea present. Right sinus exhibits no maxillary sinus tenderness and no frontal sinus tenderness. Left sinus exhibits no maxillary sinus tenderness and no frontal sinus tenderness.  Mouth/Throat: Uvula is midline, oropharynx is clear and moist and mucous membranes are normal. Normal dentition. No dental caries. No oropharyngeal exudate or tonsillar abscesses.  Eyes: Conjunctivae, EOM and lids are normal. Pupils are equal, round, and reactive to light. Lids are everted and swept, no foreign bodies found.  Neck: Trachea normal, normal range of motion and phonation normal. Neck supple. Carotid bruit is not present. No mass and no thyromegaly present.  Cardiovascular: Normal rate, regular rhythm, S1 normal, S2 normal, normal heart sounds, intact distal pulses and normal pulses.  Exam reveals no gallop.   No murmur heard. Pulmonary/Chest: Effort normal and breath sounds normal. No respiratory distress. He has no wheezes. He has no rhonchi. He has no rales.  Abdominal: Soft. Normal appearance and bowel sounds are normal. There is no hepatosplenomegaly. There is no tenderness. There is no rebound, no guarding and no CVA tenderness. No hernia.  Neurological: He is alert. He has normal reflexes.  Skin: Skin is warm, dry and intact. No rash noted.  Psychiatric: He has a normal mood and affect. His speech is normal and behavior is normal. Judgment normal.          Assessment & Plan:

## 2014-04-29 DIAGNOSIS — J069 Acute upper respiratory infection, unspecified: Secondary | ICD-10-CM | POA: Insufficient documentation

## 2014-04-29 DIAGNOSIS — B9789 Other viral agents as the cause of diseases classified elsewhere: Principal | ICD-10-CM

## 2014-04-29 NOTE — Assessment & Plan Note (Signed)
Mucinex DM twice daily. Avoid decongestants. Start nasal steroid spray flonase 2 sprays per nostril daily. Nasal saline irrigation 1-2 times a day.

## 2014-05-20 ENCOUNTER — Ambulatory Visit (INDEPENDENT_AMBULATORY_CARE_PROVIDER_SITE_OTHER): Payer: Medicare Other | Admitting: Pharmacist

## 2014-05-20 DIAGNOSIS — Z5181 Encounter for therapeutic drug level monitoring: Secondary | ICD-10-CM

## 2014-05-20 DIAGNOSIS — Z954 Presence of other heart-valve replacement: Secondary | ICD-10-CM

## 2014-05-20 DIAGNOSIS — Z952 Presence of prosthetic heart valve: Secondary | ICD-10-CM

## 2014-05-20 LAB — POCT INR: INR: 5.2

## 2014-06-03 ENCOUNTER — Ambulatory Visit (INDEPENDENT_AMBULATORY_CARE_PROVIDER_SITE_OTHER): Payer: Medicare Other | Admitting: *Deleted

## 2014-06-03 DIAGNOSIS — Z952 Presence of prosthetic heart valve: Secondary | ICD-10-CM

## 2014-06-03 DIAGNOSIS — Z5181 Encounter for therapeutic drug level monitoring: Secondary | ICD-10-CM

## 2014-06-03 DIAGNOSIS — Z954 Presence of other heart-valve replacement: Secondary | ICD-10-CM

## 2014-06-03 LAB — POCT INR: INR: 3.3

## 2014-06-11 ENCOUNTER — Encounter (HOSPITAL_COMMUNITY): Payer: Self-pay | Admitting: Internal Medicine

## 2014-06-22 ENCOUNTER — Ambulatory Visit (INDEPENDENT_AMBULATORY_CARE_PROVIDER_SITE_OTHER): Payer: Medicare Other | Admitting: *Deleted

## 2014-06-22 DIAGNOSIS — Z952 Presence of prosthetic heart valve: Secondary | ICD-10-CM

## 2014-06-22 DIAGNOSIS — Z5181 Encounter for therapeutic drug level monitoring: Secondary | ICD-10-CM

## 2014-06-22 DIAGNOSIS — Z954 Presence of other heart-valve replacement: Secondary | ICD-10-CM

## 2014-06-22 LAB — POCT INR: INR: 4.4

## 2014-07-15 ENCOUNTER — Ambulatory Visit (INDEPENDENT_AMBULATORY_CARE_PROVIDER_SITE_OTHER): Payer: Medicare Other | Admitting: *Deleted

## 2014-07-15 DIAGNOSIS — Z954 Presence of other heart-valve replacement: Secondary | ICD-10-CM

## 2014-07-15 DIAGNOSIS — Z952 Presence of prosthetic heart valve: Secondary | ICD-10-CM

## 2014-07-15 DIAGNOSIS — Z5181 Encounter for therapeutic drug level monitoring: Secondary | ICD-10-CM | POA: Diagnosis not present

## 2014-07-15 LAB — POCT INR: INR: 3.4

## 2014-07-16 ENCOUNTER — Telehealth: Payer: Self-pay | Admitting: Internal Medicine

## 2014-07-16 NOTE — Telephone Encounter (Signed)
New Message       [name] calling stating that pt was told he is suppose to schedule some type of imaging for his abdomin but there is no order in the system. Wife is requesting a phone call back from a nurse.

## 2014-07-16 NOTE — Telephone Encounter (Signed)
Called and spoke with the patient's wife she is concerned as he is having SOB with minimal exertion and wants him to be seen.  I let her know I would have Melissa call her to set up with an appointment with as extender on a day when Dr Johney Frame is her

## 2014-07-16 NOTE — Telephone Encounter (Signed)
07-16-14 [name] called re SOB w/minimal excertion--per Tresa Endo, make appt with PA on a day Allred here, called and spoke to wife, offered 07-17-14 with Ward Givens at 2p, wife checked with pt and called back declining appt stating he couldn't come and would go to ER if gets worse-offered to reschedule to another day-pt doesn't want appt at this time/mt

## 2014-07-17 ENCOUNTER — Ambulatory Visit: Payer: PRIVATE HEALTH INSURANCE | Admitting: Nurse Practitioner

## 2014-08-05 ENCOUNTER — Ambulatory Visit (INDEPENDENT_AMBULATORY_CARE_PROVIDER_SITE_OTHER): Payer: Medicare Other | Admitting: *Deleted

## 2014-08-05 DIAGNOSIS — Z954 Presence of other heart-valve replacement: Secondary | ICD-10-CM

## 2014-08-05 DIAGNOSIS — Z952 Presence of prosthetic heart valve: Secondary | ICD-10-CM

## 2014-08-05 DIAGNOSIS — Z5181 Encounter for therapeutic drug level monitoring: Secondary | ICD-10-CM

## 2014-08-05 LAB — POCT INR: INR: 3.6

## 2014-08-10 ENCOUNTER — Other Ambulatory Visit: Payer: Self-pay | Admitting: Internal Medicine

## 2014-08-27 ENCOUNTER — Ambulatory Visit (INDEPENDENT_AMBULATORY_CARE_PROVIDER_SITE_OTHER): Payer: Medicare Other | Admitting: *Deleted

## 2014-08-27 ENCOUNTER — Encounter: Payer: PRIVATE HEALTH INSURANCE | Admitting: Internal Medicine

## 2014-08-27 DIAGNOSIS — Z954 Presence of other heart-valve replacement: Secondary | ICD-10-CM | POA: Diagnosis not present

## 2014-08-27 DIAGNOSIS — Z5181 Encounter for therapeutic drug level monitoring: Secondary | ICD-10-CM

## 2014-08-27 DIAGNOSIS — Z952 Presence of prosthetic heart valve: Secondary | ICD-10-CM

## 2014-08-27 LAB — POCT INR: INR: 3.3

## 2014-09-17 ENCOUNTER — Encounter (HOSPITAL_COMMUNITY): Payer: Self-pay | Admitting: Emergency Medicine

## 2014-09-17 ENCOUNTER — Inpatient Hospital Stay (HOSPITAL_COMMUNITY)
Admission: EM | Admit: 2014-09-17 | Discharge: 2014-09-19 | DRG: 292 | Disposition: A | Discharge status: Home / Self Care | Payer: Medicare Other | Attending: Cardiology | Admitting: Cardiology

## 2014-09-17 ENCOUNTER — Emergency Department (HOSPITAL_COMMUNITY): Payer: Medicare Other

## 2014-09-17 DIAGNOSIS — K219 Gastro-esophageal reflux disease without esophagitis: Secondary | ICD-10-CM | POA: Diagnosis present

## 2014-09-17 DIAGNOSIS — R0602 Shortness of breath: Secondary | ICD-10-CM | POA: Diagnosis not present

## 2014-09-17 DIAGNOSIS — I5023 Acute on chronic systolic (congestive) heart failure: Principal | ICD-10-CM | POA: Diagnosis present

## 2014-09-17 DIAGNOSIS — Z950 Presence of cardiac pacemaker: Secondary | ICD-10-CM | POA: Diagnosis not present

## 2014-09-17 DIAGNOSIS — Z954 Presence of other heart-valve replacement: Secondary | ICD-10-CM

## 2014-09-17 DIAGNOSIS — I429 Cardiomyopathy, unspecified: Secondary | ICD-10-CM | POA: Diagnosis present

## 2014-09-17 DIAGNOSIS — I509 Heart failure, unspecified: Secondary | ICD-10-CM | POA: Diagnosis not present

## 2014-09-17 DIAGNOSIS — Z7901 Long term (current) use of anticoagulants: Secondary | ICD-10-CM | POA: Diagnosis not present

## 2014-09-17 DIAGNOSIS — Z952 Presence of prosthetic heart valve: Secondary | ICD-10-CM

## 2014-09-17 DIAGNOSIS — F329 Major depressive disorder, single episode, unspecified: Secondary | ICD-10-CM | POA: Diagnosis present

## 2014-09-17 DIAGNOSIS — I442 Atrioventricular block, complete: Secondary | ICD-10-CM | POA: Diagnosis not present

## 2014-09-17 DIAGNOSIS — Z888 Allergy status to other drugs, medicaments and biological substances status: Secondary | ICD-10-CM | POA: Diagnosis not present

## 2014-09-17 DIAGNOSIS — E785 Hyperlipidemia, unspecified: Secondary | ICD-10-CM | POA: Diagnosis present

## 2014-09-17 DIAGNOSIS — I158 Other secondary hypertension: Secondary | ICD-10-CM | POA: Diagnosis not present

## 2014-09-17 DIAGNOSIS — I10 Essential (primary) hypertension: Secondary | ICD-10-CM | POA: Diagnosis present

## 2014-09-17 HISTORY — DX: Rheumatoid arthritis, unspecified: M06.9

## 2014-09-17 HISTORY — DX: Presence of cardiac pacemaker: Z95.0

## 2014-09-17 HISTORY — DX: Reserved for inherently not codable concepts without codable children: IMO0001

## 2014-09-17 HISTORY — DX: Heart failure, unspecified: I50.9

## 2014-09-17 HISTORY — DX: Gastro-esophageal reflux disease without esophagitis: K21.9

## 2014-09-17 LAB — COMPREHENSIVE METABOLIC PANEL
ALBUMIN: 3.9 g/dL (ref 3.5–5.2)
ALK PHOS: 65 U/L (ref 39–117)
ALT: 20 U/L (ref 0–53)
AST: 13 U/L (ref 0–37)
Anion gap: 11 (ref 5–15)
BUN: 15 mg/dL (ref 6–23)
CHLORIDE: 108 mmol/L (ref 96–112)
CO2: 20 mmol/L (ref 19–32)
Calcium: 9.3 mg/dL (ref 8.4–10.5)
Creatinine, Ser: 1.14 mg/dL (ref 0.50–1.35)
GFR calc Af Amer: 72 mL/min — ABNORMAL LOW (ref >90)
GFR calc non Af Amer: 62 mL/min — ABNORMAL LOW (ref >90)
Glucose, Bld: 112 mg/dL — ABNORMAL HIGH (ref 70–99)
Potassium: 4 mmol/L (ref 3.5–5.1)
SODIUM: 139 mmol/L (ref 135–145)
Total Bilirubin: 0.9 mg/dL (ref 0.3–1.2)
Total Protein: 6.1 g/dL (ref 6.0–8.3)

## 2014-09-17 LAB — CBC WITH DIFFERENTIAL/PLATELET
BASOS ABS: 0.1 10*3/uL (ref 0.0–0.1)
Basophils Relative: 1 % (ref 0–1)
EOS ABS: 0.5 10*3/uL (ref 0.0–0.7)
Eosinophils Relative: 6 % — ABNORMAL HIGH (ref 0–5)
HCT: 41 % (ref 39.0–52.0)
HEMOGLOBIN: 13.6 g/dL (ref 13.0–17.0)
LYMPHS PCT: 23 % (ref 12–46)
Lymphs Abs: 2 10*3/uL (ref 0.7–4.0)
MCH: 27.9 pg (ref 26.0–34.0)
MCHC: 33.2 g/dL (ref 30.0–36.0)
MCV: 84.2 fL (ref 78.0–100.0)
Monocytes Absolute: 0.5 10*3/uL (ref 0.1–1.0)
Monocytes Relative: 6 % (ref 3–12)
NEUTROS ABS: 5.7 10*3/uL (ref 1.7–7.7)
Neutrophils Relative %: 64 % (ref 43–77)
PLATELETS: 234 10*3/uL (ref 150–400)
RBC: 4.87 MIL/uL (ref 4.22–5.81)
RDW: 14.7 % (ref 11.5–15.5)
WBC: 8.8 10*3/uL (ref 4.0–10.5)

## 2014-09-17 LAB — URINALYSIS, ROUTINE W REFLEX MICROSCOPIC
Bilirubin Urine: NEGATIVE
Glucose, UA: NEGATIVE mg/dL
Hgb urine dipstick: NEGATIVE
Ketones, ur: NEGATIVE mg/dL
LEUKOCYTES UA: NEGATIVE
Nitrite: NEGATIVE
PROTEIN: NEGATIVE mg/dL
SPECIFIC GRAVITY, URINE: 1.006 (ref 1.005–1.030)
UROBILINOGEN UA: 0.2 mg/dL (ref 0.0–1.0)
pH: 6.5 (ref 5.0–8.0)

## 2014-09-17 LAB — PROTIME-INR
INR: 3.31 — ABNORMAL HIGH (ref 0.00–1.49)
INR: 3.04 — AB (ref 0.00–1.49)
Prothrombin Time: 33.9 seconds — ABNORMAL HIGH (ref 11.6–15.2)
Prothrombin Time: 31.7 seconds — ABNORMAL HIGH (ref 11.6–15.2)

## 2014-09-17 LAB — I-STAT TROPONIN, ED: Troponin i, poc: 0.01 ng/mL (ref 0.00–0.08)

## 2014-09-17 LAB — TSH: TSH: 1.066 u[IU]/mL (ref 0.350–4.500)

## 2014-09-17 LAB — BRAIN NATRIURETIC PEPTIDE: B NATRIURETIC PEPTIDE 5: 1051.1 pg/mL — AB (ref 0.0–100.0)

## 2014-09-17 MED ORDER — OMEGA-3 FATTY ACIDS 1000 MG PO CAPS: 2.0000 g | ORAL_CAPSULE | Freq: Every day | ORAL | Status: DC

## 2014-09-17 MED ORDER — SODIUM CHLORIDE 0.9 % IV SOLN: 250.0000 mL | INTRAVENOUS | Status: DC | PRN

## 2014-09-17 MED ORDER — SODIUM CHLORIDE 0.9 % IJ SOLN: 3.0000 mL | INTRAMUSCULAR | Status: DC | PRN

## 2014-09-17 MED ORDER — WARFARIN - PHARMACIST DOSING INPATIENT
Freq: Every day | Status: DC
  Administered 2014-09-17: 20:00:00

## 2014-09-17 MED ORDER — METOPROLOL SUCCINATE ER 50 MG PO TB24
50.0000 mg | ORAL_TABLET | Freq: Every day | ORAL | Status: DC
  Administered 2014-09-17 – 2014-09-19 (×3): 50 mg via ORAL
  Filled 2014-09-17 (×3): qty 1

## 2014-09-17 MED ORDER — OMEGA-3-ACID ETHYL ESTERS 1 G PO CAPS
2.0000 g | ORAL_CAPSULE | Freq: Every day | ORAL | Status: DC
  Administered 2014-09-17 – 2014-09-19 (×3): 2 g via ORAL
  Filled 2014-09-17 (×3): qty 2

## 2014-09-17 MED ORDER — WARFARIN SODIUM 5 MG PO TABS
5.0000 mg | ORAL_TABLET | Freq: Every day | ORAL | Status: DC
  Administered 2014-09-18 (×2): 5 mg via ORAL
  Filled 2014-09-17 (×4): qty 1

## 2014-09-17 MED ORDER — SPIRONOLACTONE 25 MG PO TABS
25.0000 mg | ORAL_TABLET | Freq: Every day | ORAL | Status: DC
  Administered 2014-09-17 – 2014-09-19 (×3): 25 mg via ORAL
  Filled 2014-09-17 (×3): qty 1

## 2014-09-17 MED ORDER — FUROSEMIDE 10 MG/ML IJ SOLN
40.0000 mg | Freq: Two times a day (BID) | INTRAMUSCULAR | Status: DC
  Administered 2014-09-17: 40 mg via INTRAVENOUS
  Filled 2014-09-17 (×2): qty 4

## 2014-09-17 MED ORDER — ONDANSETRON HCL 4 MG/2ML IJ SOLN: 4.0000 mg | Freq: Four times a day (QID) | INTRAMUSCULAR | Status: DC | PRN

## 2014-09-17 MED ORDER — LOSARTAN POTASSIUM 50 MG PO TABS
100.0000 mg | ORAL_TABLET | Freq: Every day | ORAL | Status: DC
  Administered 2014-09-17 – 2014-09-19 (×3): 100 mg via ORAL
  Filled 2014-09-17 (×3): qty 2

## 2014-09-17 MED ORDER — SODIUM CHLORIDE 0.9 % IJ SOLN
3.0000 mL | Freq: Two times a day (BID) | INTRAMUSCULAR | Status: DC
  Administered 2014-09-17 – 2014-09-19 (×5): 3 mL via INTRAVENOUS

## 2014-09-17 MED ORDER — ACETAMINOPHEN 325 MG PO TABS: 650.0000 mg | ORAL_TABLET | ORAL | Status: DC | PRN

## 2014-09-17 MED ORDER — PANTOPRAZOLE SODIUM 40 MG PO TBEC
40.0000 mg | DELAYED_RELEASE_TABLET | Freq: Every day | ORAL | Status: DC
  Administered 2014-09-17 – 2014-09-19 (×3): 40 mg via ORAL
  Filled 2014-09-17 (×2): qty 1

## 2014-09-17 NOTE — ED Notes (Signed)
Charge RN asked to interrogate pt's pacemaker.

## 2014-09-17 NOTE — ED Notes (Signed)
Cardiology at bedside.

## 2014-09-17 NOTE — ED Provider Notes (Signed)
CSN: [id number]     Arrival date & time 09/17/14  9562 History   First MD Initiated Contact with Patient 09/17/14 [phone]     Chief Complaint  Patient presents with  . Shortness of Breath     (Consider location/radiation/quality/duration/timing/severity/associated sxs/prior Treatment) HPI The patient states he's had gradually increasing shortness of breath for a few months. He thinks this started since he was put on a beta blocker. His shortness of breath has gotten worse over the past 3 days. It is increased by lying flat. It is increased by activity. He denies associated chest pain. No fever, no cough and no chills. Past Medical History  Diagnosis Date  . Hyperlipidemia   . Hypertension   . Depression   . S/P aortic valve replacement     a. 1993 s/p SJM mechanical AVR for aortic stenosis - chronic coumadin.  . Complete heart block     a. 1993 s/p PPM 2/2 perioperative heart block;  b. 01/2012 s/p SJM Accent DC PPM upgrade.  Marland Kitchen History of pneumonia   . History of nephritis     a. as child.  Marland Kitchen NICM (nonischemic cardiomyopathy)     a. 06/2012 Echo: EF 45-50%;  b. 08/2013 Echo: EF 20-25%;  c. 10/2013 Adenosine MV: Fixed septal defect likely representing LBBB-related artifact, no reversible ischemia, EF 43%, low risk.  . Paroxysmal atrial tachycardia     a. 07/2013 noted on device interrogation.   Past Surgical History  Procedure Laterality Date  . Aortic valve replacement  1993    St. Jude Mechanical Valve  . Pacemaker insertion  1993    for AV block, not pacemaker dependant  . Anal fissure repair  1970s  . Permanent pacemaker generator change N/A 01/25/2012    Procedure: PERMANENT PACEMAKER GENERATOR CHANGE;  Surgeon: [name], MD;  Location: [facility] CATH LAB;  Service: Cardiovascular;  Laterality: N/A;   Family History  Problem Relation Age of Onset  . Arthritis Mother   . Arthritis Father   . Heart disease Father     CHF  . Coronary artery disease Neg Hx   . Stroke Neg Hx   .  Diabetes Neg Hx   . Cancer Father 47    prostate  . Hypertension Mother   . Hypertension Father    History  Substance Use Topics  . Smoking status: Never Smoker   . Smokeless tobacco: Never Used  . Alcohol Use: No    Review of Systems 10 Systems reviewed and are negative for acute change except as noted in the HPI.    Allergies  Coreg and Lisinopril  Home Medications   Prior to Admission medications   Medication Sig Start Date End Date Taking? Authorizing Provider  alum & mag hydroxide-simeth (MAALOX/MYLANTA) 200-200-20 MG/5ML suspension Take 15 mLs by mouth every 6 (six) hours as needed for indigestion or heartburn.   Yes Historical Provider, MD  fish oil-omega-3 fatty acids 1000 MG capsule Take 2 g by mouth daily.    Yes Historical Provider, MD  losartan (COZAAR) 25 MG tablet Take 1 tablet (25 mg total) by mouth daily. 12/31/13  Yes [name], MD  metoprolol succinate (TOPROL-XL) 50 MG 24 hr tablet Take 1 tablet (50 mg total) by mouth daily. Take with or immediately following a meal. 04/15/14  Yes Scott T Weaver, PA-C  ranitidine (ZANTAC) 150 MG capsule Take 150 mg by mouth as needed for heartburn.   Yes Historical Provider, MD  warfarin (COUMADIN) 5 MG tablet Take  5 mg by mouth daily.   Yes Historical Provider, MD  warfarin (COUMADIN) 5 MG tablet TAKE AS DIRECTED BY  COUMADIN  CLINIC Patient not taking: Reported on 09/17/2014 08/10/14   [name], MD   BP 164/90 mmHg  Pulse 60  Temp(Src) 97.5 F (36.4 C) (Oral)  Resp 13  Ht 5' 11.5" (1.816 m)  Wt 185 lb (83.915 kg)  BMI 25.45 kg/m2  SpO2 94% Physical Exam  Constitutional: He is oriented to person, place, and time. He appears well-developed and well-nourished.  HENT:  Head: Normocephalic and atraumatic.  Eyes: EOM are normal. Pupils are equal, round, and reactive to light.  Neck: Neck supple.  Cardiovascular: Normal rate, regular rhythm and intact distal pulses.   Occasional ectopic beat. One out of 6 systolic  ejection murmur.  Pulmonary/Chest: Effort normal and breath sounds normal.  Abdominal: Soft. Bowel sounds are normal. He exhibits no distension. There is no tenderness.  Musculoskeletal: Normal range of motion. He exhibits no edema.  Neurological: He is alert and oriented to person, place, and time. He has normal strength. Coordination normal. GCS eye subscore is 4. GCS verbal subscore is 5. GCS motor subscore is 6.  Skin: Skin is warm, dry and intact.  Psychiatric: He has a normal mood and affect.    ED Course  Procedures (including critical care time) Labs Review Labs Reviewed  COMPREHENSIVE METABOLIC PANEL - Abnormal; Notable for the following:    Glucose, Bld 112 (*)    GFR calc non Af Amer 62 (*)    GFR calc Af Amer 72 (*)    All other components within normal limits  CBC WITH DIFFERENTIAL/PLATELET - Abnormal; Notable for the following:    Eosinophils Relative 6 (*)    All other components within normal limits  BRAIN NATRIURETIC PEPTIDE - Abnormal; Notable for the following:    B Natriuretic Peptide 1051.1 (*)    All other components within normal limits  URINALYSIS, ROUTINE W REFLEX MICROSCOPIC  PROTIME-INR  I-STAT TROPOININ, ED  Rosezena Sensor, ED    Imaging Review Dg Chest 2 View  09/17/2014   CLINICAL DATA:  Shortness of breath for 3 days  EXAM: CHEST  2 VIEW  COMPARISON:  10/04/2010  FINDINGS: There is chronic mild cardiomegaly in this patient status post median sternotomy. Dual-chamber pacer leads from the right are in stable position. Unchanged aortic contours.  New interstitial coarsening with Kerley lines and trace pleural effusions. Bilateral nipple shadows are again noted.  IMPRESSION: Mild CHF.   Electronically Signed   By: Marnee Spring M.D.   On: 09/17/2014 07:28     EKG Interpretation   Date/Time:  Thursday September 17 2014 06:49:08 EDT Ventricular Rate:  66 PR Interval:  186 QRS Duration: 162 QT Interval:  462 QTC Calculation: 484 R Axis:   101 Text  Interpretation:  Sinus rhythm Multiform ventricular premature  complexes Consider left ventricular hypertrophy Repol abnrm, global  ischemia, diffuse leads Baseline wander in lead(s) I III aVL Confirmed by  Donnald Garre, MD, Lebron Conners [phone]) on 09/17/2014 8:06:30 AM     Consult: Cardiology. Patient has been evaluated in the emergency department and determination for inpatient treatment. MDM   Final diagnoses:  Acute congestive heart failure, unspecified congestive heart failure type    The patient presents as outlined above. Cardiology has evaluated and determined the patient will be treated in the hospital. At this time he is not in acute respiratory distress and does not have active chest pain.  Arby Barrette, MD 09/17/14 1017

## 2014-09-17 NOTE — Progress Notes (Signed)
ANTICOAGULATION CONSULT NOTE - Initial Consult  Pharmacy Consult for Coumadin Indication: Mech AVR  Allergies  Allergen Reactions  . Coreg [Carvedilol] Rash  . Lisinopril Rash    Patient Measurements: Height: 5\' 11"  (180.3 cm) Weight: 165 lb 8 oz (75.07 kg) IBW/kg (Calculated) : 75.3  Vital Signs: Temp: 97.6 F (36.4 C) (03/17 1405) Temp Source: Oral (03/17 0651) BP: 172/93 mmHg (03/17 1405) Pulse Rate: 63 (03/17 1405)  Labs:  Recent Labs  09/17/14 0701 09/17/14 0755  HGB 13.6  --   HCT 41.0  --   PLT 234  --   LABPROT  --  33.9*  INR  --  3.31*  CREATININE 1.14  --     Estimated Creatinine Clearance: 61.3 mL/min (by C-G formula based on Cr of 1.14).   Medical History: Past Medical History  Diagnosis Date  . Hyperlipidemia   . Hypertension   . Depression   . S/P aortic valve replacement     a. 1993 s/p SJM mechanical AVR for aortic stenosis - chronic coumadin.  . Complete heart block     a. 1993 s/p PPM 2/2 perioperative heart block;  b. 01/2012 s/p SJM Accent DC PPM upgrade.  Marland Kitchen History of pneumonia   . History of nephritis     a. as child.  Marland Kitchen NICM (nonischemic cardiomyopathy)     a. 06/2012 Echo: EF 45-50%;  b. 08/2013 Echo: EF 20-25%;  c. 10/2013 Adenosine MV: Fixed septal defect likely representing LBBB-related artifact, no reversible ischemia, EF 43%, low risk.  . Paroxysmal atrial tachycardia     a. 07/2013 noted on device interrogation.    Assessment: Worsening SOB with orthopnea and PND 74 y/o M presents with worsening SOB, admits to eating too much Na. Patient is on chronic Coumadin for h/o of mechanical AVR. Admit with acute on chronic CHF. Admit for diuresis.  Anticoagulation: Mech AVR. Admit INR 3.31 on Coumadin 5mg  daily. CBC WNL  Cardiovascular: HTN, HLD, AVR 1993, PPM, NICM, PAT. proBNP 1051.   Endocrinology: no h/o DM  Gastrointestinal / Nutrition  Neurology  Nephrology: Scr 1.14. Lytes ok  Pulmonary  Hematology / Oncology  PTA  Medication Issues  Best Practices  Goal of Therapy:  INR 2.5-3.5 Monitor platelets by anticoagulation protocol: Yes   Plan:  Continue home Coumadin regimen 5mg  daily Daily INR  Rushil Kimbrell S. Merilynn Finland, PharmD, BCPS Clinical Staff Pharmacist Pager [phone]  Misty Stanley Stillinger 09/17/2014,2:22 PM

## 2014-09-17 NOTE — H&P (Signed)
Physician History and Physical    Patient ID: Noah Thompson MRN: [id number] DOB/AGE: 08/19/1940 74 y.o. Admit date: 09/17/2014  Primary Care Physician: [name], MD Primary Cardiologist [name] MD  HPI: 75 yo WM with history of LV dysfunction and prior AVR presents to the ED with chief complaint of SOB. He reports a 2-3 day history of worsening SOB. His am he developed more severe SOB associated with orthopnea and PND. He admits to eating too much sodium. He is not on a diuretic. He has had intermittent dyspnea for the past 2 months. He is s/p AVR with mechanical prosthesis in 1993. He did not need bypass at that time. He is on chronic coumadin. He also has a history of complete heart block and is s/p PPM implant with last upgrade in 2013. His last Echo in March 2015 showed an EF 20-25%. There was a mechanical prosthesis with moderate stenosis (mean gradient 30 mm Hg) and mild insufficiency. Steffanie Dunn in April 2015 showed a fixed septal defect (? Due to LBBB) and EF 443%. Patient has no prior admissions for CHF. No cough, fever, abdominal swelling. He does complain of epigastric pain that is exacerbated by acidic foods.  Review of systems complete and found to be negative unless listed above  Past Medical History  Diagnosis Date  . Hyperlipidemia   . Hypertension   . Depression   . S/P aortic valve replacement     a. 1993 s/p SJM mechanical AVR for aortic stenosis - chronic coumadin.  . Complete heart block     a. 1993 s/p PPM 2/2 perioperative heart block;  b. 01/2012 s/p SJM Accent DC PPM upgrade.  Marland Kitchen History of pneumonia   . History of nephritis     a. as child.  Marland Kitchen NICM (nonischemic cardiomyopathy)     a. 06/2012 Echo: EF 45-50%;  b. 08/2013 Echo: EF 20-25%;  c. 10/2013 Adenosine MV: Fixed septal defect likely representing LBBB-related artifact, no reversible ischemia, EF 43%, low risk.  . Paroxysmal atrial tachycardia     a. 07/2013 noted on device interrogation.      Family History  Problem Relation Age of Onset  . Arthritis Mother   . Arthritis Father   . Heart disease Father     CHF  . Coronary artery disease Neg Hx   . Stroke Neg Hx   . Diabetes Neg Hx   . Cancer Father 48    prostate  . Hypertension Mother   . Hypertension Father     History   Social History  . Marital Status: Married    Spouse Name: N/A  . Number of Children: 1  . Years of Education: N/A   Occupational History  . retired    Social History Main Topics  . Smoking status: Never Smoker   . Smokeless tobacco: Never Used  . Alcohol Use: No  . Drug Use: No  . Sexual Activity: Not on file   Other Topics Concern  . Not on file   Social History Narrative   Lives with wife, 5 dogs.  grown children away.   Occupation: retired, was Teaching laboratory technician   Activity: welding    Past Surgical History  Procedure Laterality Date  . Aortic valve replacement  1993    St. Jude Mechanical Valve  . Pacemaker insertion  1993    for AV block, not pacemaker dependant  . Anal fissure repair  1970s  . Permanent pacemaker generator change N/A 01/25/2012  Procedure: PERMANENT PACEMAKER GENERATOR CHANGE;  Surgeon: [name], MD;  Location: [facility] CATH LAB;  Service: Cardiovascular;  Laterality: N/A;      Physical Exam: Blood pressure 164/90, pulse 60, temperature 97.5 F (36.4 C), temperature source Oral, resp. rate 13, height 5' 11.5" (1.816 m), weight 185 lb (83.915 kg), SpO2 94 %.  Current Weight  09/17/14 185 lb (83.915 kg)  04/24/14 183 lb (83.008 kg)  04/15/14 184 lb (83.462 kg)    GENERAL:  Well appearing WM in NAD HEENT:  PERRL, EOMI, sclera are clear. Oropharynx is clear. NECK:  Jugular venous distention to 8 cm, carotid upstroke brisk and symmetric, no bruits, no thyromegaly or adenopathy LUNGS:  Bibasilar rales. CHEST:  Unremarkable HEART:  RRR,  PMI not displaced or sustained, mechanical S2, positive  S3, no S4: no clicks, no rubs, 2/6 systolic ejection murmur  RUSB. ABD:  Soft, nontender. BS +, no masses or bruits. No hepatomegaly, no splenomegaly EXT:  2 + pulses throughout, no edema, no cyanosis no clubbing SKIN:  Warm and dry.  No rashes NEURO:  Alert and oriented x 3. Cranial nerves II through XII intact. PSYCH:  Cognitively intact    Labs:   Lab Results  Component Value Date   WBC 8.8 09/17/2014   HGB 13.6 09/17/2014   HCT 41.0 09/17/2014   MCV 84.2 09/17/2014   PLT 234 09/17/2014     Recent Labs Lab 09/17/14 0701  NA 139  K 4.0  CL 108  CO2 20  BUN 15  CREATININE 1.14  CALCIUM 9.3  PROT 6.1  BILITOT 0.9  ALKPHOS 65  ALT 20  AST 13  GLUCOSE 112*   No results found for: CKMB, CKMBINDEX, TROPONINI  Lab Results  Component Value Date   CHOL 203* 01/23/2014   CHOL 217* 12/14/2011   CHOL 231* 05/12/2009   Lab Results  Component Value Date   HDL 46 01/23/2014   HDL 45.50 12/14/2011   HDL 36.80* 05/12/2009   Lab Results  Component Value Date   LDLCALC 129* 01/23/2014   Lab Results  Component Value Date   TRIG 142 01/23/2014   TRIG 143.0 12/14/2011   TRIG 294.0* 05/12/2009   Lab Results  Component Value Date   CHOLHDL 4.4 01/23/2014   CHOLHDL 5 12/14/2011   CHOLHDL 6 05/12/2009   Lab Results  Component Value Date   LDLDIRECT 140.7 12/14/2011   LDLDIRECT 145.6 05/12/2009    No results found for: PROBNP Lab Results  Component Value Date   TSH 1.124 01/23/2014   No results found for: HGBA1C  Radiology: CHEST 2 VIEW  COMPARISON: 10/04/2010  FINDINGS: There is chronic mild cardiomegaly in this patient status post median sternotomy. Dual-chamber pacer leads from the right are in stable position. Unchanged aortic contours.  New interstitial coarsening with Kerley lines and trace pleural effusions. Bilateral nipple shadows are again noted.  IMPRESSION: Mild CHF.   Electronically Signed By: Marnee Spring M.D. On: 09/17/2014 07:28 I have personally reviewed and interpreted this  study.  EKG: NSR with PVCs, LBBB. I have personally reviewed and interpreted this study.  Pacemaker interrogation: Normal device function. Battery voltage 2.93 volts. No high rate episodes.   ASSESSMENT AND PLAN:  1. Acute on chronic systolic CHF. Currently on no diuretics. Will continue Metoprolol. History of rash on carvedilol.  Increase losartan dose. Will admit for IV diuresis. Would also add aldactone. Will update Echo. Follow electrolytes closely. Sodium restriction. 2. HTN- poorly controlled. Increase meds as noted.  3. S/p AVR. Moderate gradient by Echo- will repeat. 4. Chronic anticoagulation. Check INR and ask pharmacy to adjust. 5. Nonischemic cardiomyopathy. Prior discrepancy between Myoview and Echo EF. Will need to carefully review Echo study.  6. GERD by history. Will start PPI.  7. Complete heart block s/p PPM with normal pacemaker function.  Signed: Peter [place], MDFACC  09/17/2014, 10:05 AM

## 2014-09-17 NOTE — ED Notes (Signed)
Pt presents to the department with SOB, pt reports it has been ongoing for a couple of months when he was s started on a beta blocker. Pt has a Scientist, research (life sciences). Pt also reports dizziness. Denies chest pain. A&OX4. SOB is worse when he lays flat.

## 2014-09-18 DIAGNOSIS — I509 Heart failure, unspecified: Secondary | ICD-10-CM

## 2014-09-18 DIAGNOSIS — I158 Other secondary hypertension: Secondary | ICD-10-CM

## 2014-09-18 DIAGNOSIS — Z7901 Long term (current) use of anticoagulants: Secondary | ICD-10-CM

## 2014-09-18 LAB — BASIC METABOLIC PANEL
ANION GAP: 9 (ref 5–15)
BUN: 13 mg/dL (ref 6–23)
CO2: 27 mmol/L (ref 19–32)
Calcium: 9.7 mg/dL (ref 8.4–10.5)
Chloride: 104 mmol/L (ref 96–112)
Creatinine, Ser: 1.24 mg/dL (ref 0.50–1.35)
GFR calc Af Amer: 65 mL/min — ABNORMAL LOW (ref >90)
GFR calc non Af Amer: 56 mL/min — ABNORMAL LOW (ref >90)
GLUCOSE: 92 mg/dL (ref 70–99)
Potassium: 3.6 mmol/L (ref 3.5–5.1)
SODIUM: 140 mmol/L (ref 135–145)

## 2014-09-18 LAB — PROTIME-INR
INR: 2.7 — AB (ref 0.00–1.49)
PROTHROMBIN TIME: 28.9 s — AB (ref 11.6–15.2)

## 2014-09-18 MED ORDER — FUROSEMIDE 40 MG PO TABS
40.0000 mg | ORAL_TABLET | Freq: Every day | ORAL | Status: DC
  Administered 2014-09-18 – 2014-09-19 (×2): 40 mg via ORAL
  Filled 2014-09-18 (×2): qty 1

## 2014-09-18 MED ORDER — FUROSEMIDE 10 MG/ML IJ SOLN: 40.0000 mg | Freq: Two times a day (BID) | INTRAMUSCULAR | Status: DC

## 2014-09-18 MED ORDER — POTASSIUM CHLORIDE CRYS ER 20 MEQ PO TBCR
40.0000 meq | EXTENDED_RELEASE_TABLET | Freq: Once | ORAL | Status: AC
  Administered 2014-09-18: 40 meq via ORAL
  Filled 2014-09-18: qty 2

## 2014-09-18 NOTE — Progress Notes (Signed)
ANTICOAGULATION CONSULT NOTE - Follow Up Consult  Pharmacy Consult for Coumadin Indication: mechanical AVR  Allergies  Allergen Reactions  . Coreg [Carvedilol] Rash  . Lisinopril Rash    Patient Measurements: Height: 5\' 11"  (180.3 cm) Weight: 165 lb 8 oz (75.07 kg) (scale c) IBW/kg (Calculated) : 75.3  Vital Signs: Temp: 98.2 F (36.8 C) (03/18 0709) Temp Source: Oral (03/18 0709) BP: 147/85 mmHg (03/18 0709) Pulse Rate: 60 (03/18 0709)  Labs:  Recent Labs  09/17/14 0701 09/17/14 0755 09/17/14 1644 09/18/14 0449  HGB 13.6  --   --   --   HCT 41.0  --   --   --   PLT 234  --   --   --   LABPROT  --  33.9* 31.7* 28.9*  INR  --  3.31* 3.04* 2.70*  CREATININE 1.14  --   --  1.24    Estimated Creatinine Clearance: 56.4 mL/min (by C-G formula based on Cr of 1.24).  Assessment: 74 year old male on chronic Coumadin for mechanical AVR admitted for diuresis in setting of CHF.   INR today is therapeutic at 2.70 on home regimen of Coumadin 5 mg po daily.   Goal of Therapy:  INR 2.5 to 3.5 Monitor platelets by anticoagulation protocol: Yes   Plan:  Continue Coumadin 5mg  daily. Daily PT/INR.   Link Snuffer, PharmD, BCPS Clinical Pharmacist [phone]  09/18/2014,8:26 AM

## 2014-09-18 NOTE — Progress Notes (Addendum)
Heart Failure Navigator Consult Note  Presentation: Noah Thompson is a 74 yo WM with history of LV dysfunction and prior AVR presents to the ED with chief complaint of SOB. He reports a 2-3 day history of worsening SOB. His am he developed more severe SOB associated with orthopnea and PND. He admits to eating too much sodium. He is not on a diuretic. He has had intermittent dyspnea for the past 2 months. He is s/p AVR with mechanical prosthesis in 1993. He did not need bypass at that time. He is on chronic coumadin. He also has a history of complete heart block and is s/p PPM implant with last upgrade in 2013. His last Echo in March 2015 showed an EF 20-25%. There was a mechanical prosthesis with moderate stenosis (mean gradient 30 mm Hg) and mild insufficiency. Steffanie Dunn in April 2015 showed a fixed septal defect (? Due to LBBB) and EF 443%. Patient has no prior admissions for CHF. No cough, fever, abdominal swelling. He does complain of epigastric pain that is exacerbated by acidic foods.   Past Medical History  Diagnosis Date  . Hyperlipidemia   . Hypertension   . Depression   . S/P aortic valve replacement     a. 1993 s/p SJM mechanical AVR for aortic stenosis - chronic coumadin.  . Complete heart block     a. 1993 s/p PPM 2/2 perioperative heart block;  b. 01/2012 s/p SJM Accent DC PPM upgrade.  Marland Kitchen History of pneumonia   . History of nephritis     a. as child.  Marland Kitchen NICM (nonischemic cardiomyopathy)     a. 06/2012 Echo: EF 45-50%;  b. 08/2013 Echo: EF 20-25%;  c. 10/2013 Adenosine MV: Fixed septal defect likely representing LBBB-related artifact, no reversible ischemia, EF 43%, low risk.  . Paroxysmal atrial tachycardia     a. 07/2013 noted on device interrogation.  . CHF (congestive heart failure)   . Shortness of breath dyspnea   . GERD (gastroesophageal reflux disease)   . RA (rheumatoid arthritis)   . Presence of permanent cardiac pacemaker 2013    History   Social History   . Marital Status: Married    Spouse Name: N/A  . Number of Children: 1  . Years of Education: N/A   Occupational History  . retired    Social History Main Topics  . Smoking status: Never Smoker   . Smokeless tobacco: Never Used  . Alcohol Use: No  . Drug Use: No  . Sexual Activity: Not on file   Other Topics Concern  . None   Social History Narrative   Lives with wife, 5 dogs.  grown children away.   Occupation: retired, was Teaching laboratory technician   Activity: welding    ECHO: Study Conclusions--09/18/14  - Left ventricle: The cavity size was severely dilated. Wall thickness was normal. Systolic function was severely reduced. The estimated ejection fraction was in the range of 20% to 25%. Diffuse hypokinesis. Doppler parameters are consistent with elevated ventricular end-diastolic filling pressure. - Aortic valve: Mechanical AVR not well seen. Peak velocity 3.27 m/sec mean gradient 25 mmHg Peak 43 mmHg which are high especially given degree of LV dysfunction Consider TEE if clinically indicated. - Mitral valve: There was mild regurgitation. - Right atrium: The atrium was mildly dilated. - Line: A venous catheter was visualized in the superior vena cava, with its tip in the right atrium. No abnormal features noted.  ------------------------------------------------------------------- Labs, prior tests, procedures, and surgery: Permanent pacemaker  system implantation.  Transthoracic echocardiography. M-mode, complete 2D, spectral Doppler, and color Doppler. Birthdate: Patient birthdate: 1941/03/26. Age: Patient is 74 yr old. Sex: Gender: male. Blood pressure:   126/85 Patient status: Inpatient. Study date: Study date: 09/18/2014. Study time: 12:00 PM. Location: Bedside.  BNP    Component Value Date/Time   BNP 1051.1* 09/17/2014 0701    ProBNP No results found for: PROBNP   Education Assessment and Provision:  Detailed education and  instructions provided on heart failure disease management including the following:  Signs and symptoms of Heart Failure When to call the physician Importance of daily weights Low sodium diet Fluid restriction Medication management Anticipated future follow-up appointments  Patient education given on each of the above topics.   I spoke with Noah Thompson, his wife and daughter regarding his HF.  He tells me that he was never given HF recommendations at the time of prior echo 3/15 when his EF was noted to be 20-25%.  He has not been weighing daily and does not control sodium in his diet.  They are open to teaching and making necessary changes to affect his health. Low sodium diet and high sodium foods to avoid reviewed.  Dietician to follow as well with more low sodium dietary education.  They have plans to buy a scale after hospitalization.  I have explained the importance of daily weights and its relevance to his HF.  They are able to teach back topics listed above and they acknowledge understanding.   Education Materials:  "Living Better With Heart Failure" Booklet, Daily Weight Tracker Tool and Heart Failure Educational Video.   High Risk Criteria for Readmission and/or Poor Patient Outcomes:   EF <30%-  Yes 20-25%  2 or more admissions in 6 months- No  Difficult social situation- No  Demonstrates medication noncompliance- No   Barriers of Care:  Knowledge and compliance   Discharge Planning:   Plans to return home with wife near [place].

## 2014-09-18 NOTE — Progress Notes (Signed)
I cosign all documentation and medication administration for this shift by Komicia S Jefferies, Student RN. 

## 2014-09-18 NOTE — Progress Notes (Signed)
  Echocardiogram 2D Echocardiogram has been performed.  Delcie Roch 09/18/2014, 12:56 PM

## 2014-09-18 NOTE — Progress Notes (Signed)
Patient is  A/Ox4 and is independent. He had no c/o pain during the shift. Around 1800 was notified by telemetry monitor tech that patient had a 7 beat run of v-tach. Pt.was asymptomatic. VS were taken and documented. On call MD for Dr.Jordan was paged and notified. New orders placed for potassium. Potassium was given.

## 2014-09-18 NOTE — Progress Notes (Signed)
Nutrition Education Note  RD consulted for nutrition education regarding new onset CHF.  RD reviewed and discussed "Heart Failure Nutrition Therapy" handout from the Academy of [employer]etics. Driscilla Moats, Heart Failure coordinator, assisted in providing education. Discussed patient's current diet. Provided examples on ways to decrease sodium intake in diet. Discouraged intake of processed foods and use of salt shaker. Reviewed common high sodium foods to avoid. Encouraged fresh fruits and vegetables as well as whole grain sources of carbohydrates to maximize fiber intake.   RD discussed why it is important for patient to adhere to diet recommendations, and emphasized the role of fluids, foods to avoid, and importance of weighing self daily. Teach back method used.  Expect good compliance.  Body mass index is 23 kg/(m^2). Pt meets criteria for Normal Weight based on current BMI.  Current diet order is 2 Gram Sodium, patient is consuming approximately 100% of meals at this time. Labs and medications reviewed. No further nutrition interventions warranted at this time. RD contact information provided. If additional nutrition issues arise, please re-consult RD.   Ian Malkin RD, LDN Inpatient Clinical Dietitian Pager: [phone] After Hours Pager: [phone]

## 2014-09-18 NOTE — Progress Notes (Signed)
       Patient Name: Noah Thompson Date of Encounter: 09/18/2014    SUBJECTIVE: Feels better since admission. He is able to lie down. He feels his breathing is back to normal and better than it has been for quite some time. His had nearly 2 L of fluid off. Has systolic heart failure. Has not been chronically on diuretic therapy.History is compatible with progressive shortness of breath over the past month but acutely so the past 48 hours prior to admission on 09/17/14. He feels well enough to go home.  TELEMETRY:  Paced rhythm Filed Vitals:   09/17/14 1405 09/17/14 2252 09/18/14 0209 09/18/14 0709  BP: 172/93 153/88 144/86 147/85  Pulse: 63 60 65 60  Temp: 97.6 F (36.4 C) 97.7 F (36.5 C) 98.6 F (37 C) 98.2 F (36.8 C)  TempSrc:  Oral Oral Oral  Resp: 18 17 18 18   Height: 5\' 11"  (1.803 m)     Weight: 165 lb 8 oz (75.07 kg)     SpO2: 96% 97% 93% 95%    Intake/Output Summary (Last 24 hours) at 09/18/14 0916 Last data filed at 09/18/14 0709  Gross per 24 hour  Intake    480 ml  Output   2425 ml  Net  -1945 ml   LABS: Basic Metabolic Panel:  Recent Labs  01/56/15 0701 09/18/14 0449  NA 139 140  K 4.0 3.6  CL 108 104  CO2 20 27  GLUCOSE 112* 92  BUN 15 13  CREATININE 1.14 1.24  CALCIUM 9.3 9.7   CBC:  Recent Labs  09/17/14 0701  WBC 8.8  NEUTROABS 5.7  HGB 13.6  HCT 41.0  MCV 84.2  PLT 234   BNP    Component Value Date/Time   BNP 1051.1* 09/17/2014 0701     Radiology/Studies:  Admission chest x-ray reveals CHF  Physical Exam: Blood pressure 147/85, pulse 60, temperature 98.2 F (36.8 C), temperature source Oral, resp. rate 18, height 5\' 11"  (1.803 m), weight 165 lb 8 oz (75.07 kg), SpO2 95 %. Weight change: -19 lb 8 oz (-8.845 kg)  Wt Readings from Last 3 Encounters:  09/17/14 165 lb 8 oz (75.07 kg)  04/24/14 183 lb (83.008 kg)  04/15/14 184 lb (83.462 kg)   Chest is clear Neck veins are flat Mechanical aortic valve closure sound. 2/6  systolic murmur right upper sternal border. I don't hear aortic regurgitation. No peripheral edema  ASSESSMENT:  1. Acute on chronic systolic heart failure markedly improved with diuresis. LVEF less than 30% 2. Aortic valve mechanical prosthesis, greater than 47 years old. 3. Permanent pacemaker for complete heart block 4. Chronic anticoagulation  Plan:  1. Convert to oral furosemide. We'll give 40 mg tonight and then 40 mg daily. Check renal function in a.m. He is diuretic nave and the combination of furosemide plus Aldactone may be too aggressive. 2. Anticipate discharge home unless overdiuresis or other issues arise. 3. Await echo results.  Selinda Eon 09/18/2014, 9:16 AM

## 2014-09-19 LAB — BASIC METABOLIC PANEL
ANION GAP: 11 (ref 5–15)
BUN: 18 mg/dL (ref 6–23)
CALCIUM: 9.6 mg/dL (ref 8.4–10.5)
CO2: 26 mmol/L (ref 19–32)
CREATININE: 1.28 mg/dL (ref 0.50–1.35)
Chloride: 102 mmol/L (ref 96–112)
GFR calc Af Amer: 62 mL/min — ABNORMAL LOW (ref >90)
GFR calc non Af Amer: 54 mL/min — ABNORMAL LOW (ref >90)
GLUCOSE: 98 mg/dL (ref 70–99)
POTASSIUM: 4 mmol/L (ref 3.5–5.1)
SODIUM: 139 mmol/L (ref 135–145)

## 2014-09-19 LAB — PROTIME-INR
INR: 3.07 — AB (ref 0.00–1.49)
PROTHROMBIN TIME: 32 s — AB (ref 11.6–15.2)

## 2014-09-19 MED ORDER — SPIRONOLACTONE 25 MG PO TABS: 25.0000 mg | ORAL_TABLET | Freq: Every day | ORAL | Status: DC

## 2014-09-19 MED ORDER — LOSARTAN POTASSIUM 100 MG PO TABS: 100.0000 mg | ORAL_TABLET | Freq: Every day | ORAL | Status: DC

## 2014-09-19 MED ORDER — FUROSEMIDE 20 MG PO TABS: 20.0000 mg | ORAL_TABLET | Freq: Every day | ORAL | Status: DC

## 2014-09-19 NOTE — Progress Notes (Signed)
Patient Profile: 74 yo WM with history of LV dysfunction and prior AVR presented to the ED with chief complaint of SOB. Admitted for acute on chronic systolic CHF in the setting of dietary indiscretion.   Subjective: No complaints. Symptoms resolved. Denies any further dyspnea. No CP.   Objective: Vital signs in last 24 hours: Temp:  [97.7 F (36.5 C)-98.2 F (36.8 C)] 97.8 F (36.6 C) (03/19 0628) Pulse Rate:  [62-63] 63 (03/19 0628) Resp:  [18-20] 18 (03/19 0628) BP: (126-144)/(86-88) 126/88 mmHg (03/19 0628) SpO2:  [94 %-97 %] 94 % (03/19 0628) Weight:  [158 lb 4.8 oz (71.804 kg)] 158 lb 4.8 oz (71.804 kg) (03/19 0628) Last BM Date: 09/18/14  Intake/Output from previous day: 03/18 0701 - 03/19 0700 In: 1303 [P.O.:1300; I.V.:3] Out: 2100 [Urine:2100] Intake/Output this shift: Total I/O In: 240 [P.O.:240] Out: -   Medications Current Facility-Administered Medications  Medication Dose Route Frequency Provider Last Rate Last Dose  . 0.9 %  sodium chloride infusion  250 mL Intravenous PRN Peter M [place], MD      . acetaminophen (TYLENOL) tablet 650 mg  650 mg Oral Q4H PRN Peter M [place], MD      . furosemide (LASIX) tablet 40 mg  40 mg Oral Daily Lyn Records, MD   40 mg at 09/18/14 1938  . losartan (COZAAR) tablet 100 mg  100 mg Oral Daily Peter M [place], MD   100 mg at 09/18/14 1033  . metoprolol succinate (TOPROL-XL) 24 hr tablet 50 mg  50 mg Oral Daily Peter M [place], MD   50 mg at 09/18/14 1033  . omega-3 acid ethyl esters (LOVAZA) capsule 2 g  2 g Oral Daily Peter M [place], MD   2 g at 09/18/14 1033  . ondansetron (ZOFRAN) injection 4 mg  4 mg Intravenous Q6H PRN Peter M [place], MD      . pantoprazole (PROTONIX) EC tablet 40 mg  40 mg Oral Daily Peter M [place], MD   40 mg at 09/18/14 1034  . sodium chloride 0.9 % injection 3 mL  3 mL Intravenous Q12H Peter M [place], MD   3 mL at 09/18/14 2112  . sodium chloride 0.9 % injection 3 mL  3 mL Intravenous PRN Peter M [place],  MD      . spironolactone (ALDACTONE) tablet 25 mg  25 mg Oral Daily Peter M [place], MD   25 mg at 09/18/14 1033  . warfarin (COUMADIN) tablet 5 mg  5 mg Oral q1800 Norva Pavlov, RPH   5 mg at 09/18/14 1938  . Warfarin - Pharmacist Dosing Inpatient   Does not apply q1800 Norva Pavlov, [facility]        PE: General appearance: alert, cooperative and no distress Neck: no carotid bruit and no JVD Lungs: clear to auscultation bilaterally Heart: regular rate and rhythm, S1, S2 normal, no murmur, click, rub or gallop Extremities: no LEE Pulses: 2+ and symmetric Skin: warm and dry Neurologic: Grossly normal  Lab Results:   Recent Labs  09/17/14 0701  WBC 8.8  HGB 13.6  HCT 41.0  PLT 234   BMET  Recent Labs  09/17/14 0701 09/18/14 0449 09/19/14 0400  NA 139 140 139  K 4.0 3.6 4.0  CL 108 104 102  CO2 GLUCOSE 112* 92 98  BUN CREATININE 1.14 1.24 1.28  CALCIUM 9.3 9.7 9.6   PT/INR  Recent Labs  09/17/14 1644 09/18/14 0449  09/19/14 0400  LABPROT 31.7* 28.9* 32.0*  INR 3.04* 2.70* 3.07*   Cholesterol No results for input(s): CHOL in the last 72 hours. Cardiac Panel (last 3 results) No results for input(s): CKTOTAL, CKMB, TROPONINI, RELINDX in the last 72 hours.  Studies/Results: 2D Echo 09/18/14  Study Conclusions  - Left ventricle: The cavity size was severely dilated. Wall thickness was normal. Systolic function was severely reduced. The estimated ejection fraction was in the range of 20% to 25%. Diffuse hypokinesis. Doppler parameters are consistent with elevated ventricular end-diastolic filling pressure. - Aortic valve: Mechanical AVR not well seen. Peak velocity 3.27 m/sec mean gradient 25 mmHg Peak 43 mmHg which are high especially given degree of LV dysfunction Consider TEE if clinically indicated. - Mitral valve: There was mild regurgitation. - Right atrium: The atrium was mildly dilated. - Line: A venous  catheter was visualized in the superior vena cava, with its tip in the right atrium. No abnormal features noted.   Assessment/Plan  Principal Problem:   Acute on chronic systolic CHF (congestive heart failure) Active Problems:   PACEMAKER-St.Jude   Long term current use of anticoagulant therapy   Complete heart block   S/P aortic valve replacement   NICM (nonischemic cardiomyopathy)   Hypertension   1. Acute on Chronic CHF: EF 20-25%. Unchanged compared to prior echo 08/2013 (f/u NST 10/2013 was low risk). Euvolemic on physical exam. Symptoms resolved. Scr and K stable on PO Lasix. Continue Lasix 40 mg , Toprol XL 50 mg, Losartan 100 mg and spironolactone 25 mg. Low sodium diet + daily weights. Close OP f/u.   2. Mechanical Aortic Valve: Mechanical AVR not well seen on echo. Peak velocity 3.45m/sec mean gradient 25 mmHg Peak 43 mmHg which are high especially given degree of LV dysfunction Consider TEE if clinically indicated. Will defer to MD. INR is therapeutic at 3.07.  3. Chronic Anticoagulation: on Warfarin for mechanical AV. Therapeutic at 3.07. Goal is 2.5- 3.5.   4. H/o CHB: has PPM  5. HTN: well controlled on current regimen.     LOS: 2 days    Brittainy M. Delmer Islam 09/19/2014 10:31 AM   Patient seen and discussed with PA Sharol Harness. Admitted with acute on chronic systolic HF, echo shows LVEF stable at 20-25%. Negative 2 liters since admission, symptoms have improved, currently on oral lasix 40 mg daily. Cr and BUN are stable, vitals stable. On lasix 40mg  oral daily he was negative yesterday, will decrease to 20mg  daily for maintence. WIll need BMET and f/u in 2 weeks. Plan for discharge today.   Dominga Ferry MD

## 2014-09-19 NOTE — Plan of Care (Signed)
Problem: Phase I Progression Outcomes Goal: EF % per last Echo/documented,Core Reminder form on chart Outcome: Completed/Met Date Met:  09/19/14 EF 20-25%

## 2014-09-19 NOTE — Progress Notes (Signed)
ANTICOAGULATION CONSULT NOTE - Follow Up Consult  Pharmacy Consult for Coumadin Indication: mechanical AVR  Allergies  Allergen Reactions  . Coreg [Carvedilol] Rash  . Lisinopril Rash    Patient Measurements: Height: 5\' 11"  (180.3 cm) Weight: 158 lb 4.8 oz (71.804 kg) (scale b) IBW/kg (Calculated) : 75.3  Vital Signs: Temp: 97.8 F (36.6 C) (03/19 0628) Temp Source: Oral (03/19 0628) BP: 126/88 mmHg (03/19 0628) Pulse Rate: 63 (03/19 0628)  Labs:  Recent Labs  09/17/14 0701  09/17/14 1644 09/18/14 0449 09/19/14 0400  HGB 13.6  --   --   --   --   HCT 41.0  --   --   --   --   PLT 234  --   --   --   --   LABPROT  --   < > 31.7* 28.9* 32.0*  INR  --   < > 3.04* 2.70* 3.07*  CREATININE 1.14  --   --  1.24 1.28  < > = values in this interval not displayed.  Estimated Creatinine Clearance: 52.2 mL/min (by C-G formula based on Cr of 1.28).  Assessment: 74 year old male on chronic Coumadin for mechanical AVR admitted for diuresis in setting of CHF.   INR today is therapeutic at 3.07 on home regimen of Coumadin 5 mg po daily. All doses charted.  No repeat CBC since 3/17. No bleeding recorded.   Goal of Therapy:  INR 2.5 to 3.5 Monitor platelets by anticoagulation protocol: Yes   Plan:  Continue Coumadin 5mg  daily. Daily PT/INR.   Link Snuffer, PharmD, BCPS Clinical Pharmacist [phone]  09/19/2014,9:56 AM

## 2014-09-19 NOTE — Discharge Summary (Signed)
Physician Discharge Summary  Patient ID: TREVIN WOOLERY MRN: [id number] DOB/AGE: 74-25-1942 74 y.o.   Primary Cardiologist: Dr. Wyline Mood  Admit date: 09/17/2014 Discharge date: 09/19/2014  Admission Diagnoses: Acute on Chronic Systolic CHF  Discharge Diagnoses:  Principal Problem:   Acute on chronic systolic CHF (congestive heart failure) Active Problems:   PACEMAKER-St.Jude   Long term current use of anticoagulant therapy   Complete heart block   S/P aortic valve replacement   NICM (nonischemic cardiomyopathy)   Hypertension   Discharged Condition: stable  Hospital Course: The patient is a 74 y/o male, followed by Dr. Johney Frame, with a history of LV dysfunction and prior AVR who presented to the [facility] ED on 09/17/14 with complaint of dyspnea. He denied chest pain. He is s/p AVR with mechanical prosthesis in 1993. He did not need bypass at that time. He is on chronic coumadin therapy. He also has a history of complete heart block and is s/p PPM implant with last upgrade in 2013. His last Echo in March 2015 showed an EF 20-25%. There was a mechanical prosthesis with moderate stenosis (mean gradient 30 mm Hg) and mild insufficiency. Steffanie Dunn in April 2015 showed a fixed septal defect (? Due to LBBB) and EF 43%.  W/u in the ED suggested he was in acute CHF. He was admitted for IV diuretic therapy. His BP was also elevated and he required upward titration of his antihypertensives. He had a good response to IV dieretic therapy and his symptoms improved. He denied any recurrent dyspnea. No exertional symptoms. He was transitioned to PO Lasix and renal function remained stable.  Repeat 2D echo was ordered which demonstrated EF of 20-25%, unchanged from prior study. He was continue on ARB and BB therapy as well as spironolactone. His BP improved after medication adjustments. His INR remained therapeutic. He was last seen and examined by Dr. Wyline Mood who determined he was stable for discharge home.  He has been instructed to follow-up in 2 weeks with repeat BMP.   Consults: None  Significant Diagnostic Studies: 2D echo 09/18/14 Study Conclusions  - Left ventricle: The cavity size was severely dilated. Wall thickness was normal. Systolic function was severely reduced. The estimated ejection fraction was in the range of 20% to 25%. Diffuse hypokinesis. Doppler parameters are consistent with elevated ventricular end-diastolic filling pressure. - Aortic valve: Mechanical AVR not well seen. Peak velocity 3.27 m/sec mean gradient 25 mmHg Peak 43 mmHg which are high especially given degree of LV dysfunction Consider TEE if clinically indicated. - Mitral valve: There was mild regurgitation. - Right atrium: The atrium was mildly dilated. - Line: A venous catheter was visualized in the superior vena cava, with its tip in the right atrium. No abnormal features noted.   Treatments: See Hospital Course  Discharge Exam: Blood pressure 104/66, pulse 63, temperature 97.8 F (36.6 C), temperature source Oral, resp. rate 16, height 5\' 11"  (1.803 m), weight 158 lb 4.8 oz (71.804 kg), SpO2 99 %.   Disposition: 01-Home or Self Care      Discharge Instructions    Diet - low sodium heart healthy    Complete by:  As directed      Increase activity slowly    Complete by:  As directed             Medication List    TAKE these medications        alum & mag hydroxide-simeth 200-200-20 MG/5ML suspension  Commonly known as:  MAALOX/MYLANTA  Take  15 mLs by mouth every 6 (six) hours as needed for indigestion or heartburn.     fish oil-omega-3 fatty acids 1000 MG capsule  Take 2 g by mouth daily.     furosemide 20 MG tablet  Commonly known as:  LASIX  Take 1 tablet (20 mg total) by mouth daily.  Start taking on:  09/20/2014     losartan 100 MG tablet  Commonly known as:  COZAAR  Take 1 tablet (100 mg total) by mouth daily.     metoprolol succinate 50 MG 24 hr tablet    Commonly known as:  TOPROL-XL  Take 1 tablet (50 mg total) by mouth daily. Take with or immediately following a meal.     ranitidine 150 MG capsule  Commonly known as:  ZANTAC  Take 150 mg by mouth as needed for heartburn.     spironolactone 25 MG tablet  Commonly known as:  ALDACTONE  Take 1 tablet (25 mg total) by mouth daily.     warfarin 5 MG tablet  Commonly known as:  COUMADIN  Take 5 mg by mouth daily.     warfarin 5 MG tablet  Commonly known as:  COUMADIN  TAKE AS DIRECTED BY  COUMADIN  CLINIC       Follow-up Information    Follow up with [name], MD.   Specialty:  Cardiology   Why:  our office will call you with a follow-up appointment   Contact information:   [address] [place] [address] [phone]     TIME SPENT ON DISCHARGE, INCLUDING PHYSICIAN TIME: >30 MINUTES  Signed: Robbie Lis 09/19/2014, 1:17 PM

## 2014-09-21 NOTE — Progress Notes (Signed)
Utilization review complete. Cortney Mckinney RN CCM Case Mgmt phone [phone] 

## 2014-10-05 ENCOUNTER — Telehealth: Payer: Self-pay | Admitting: Internal Medicine

## 2014-10-05 NOTE — Telephone Encounter (Signed)
New MEssage  Pt wife calling to speak w/ Rn about metropolol medications: clarify dosage change.  Please call back and discuss.

## 2014-10-05 NOTE — Telephone Encounter (Signed)
Follow Up      [name] calling about the same issue. Please call back.

## 2014-10-08 NOTE — Telephone Encounter (Signed)
metoprolol succinate 50 MG 24 hr tablet  Commonly known as: TOPROL-XL  Take 1 tablet (50 mg total) by mouth daily. Take with or immediately following a meal.         This is what the discharger summary indicates

## 2014-10-17 ENCOUNTER — Other Ambulatory Visit: Payer: Self-pay | Admitting: Internal Medicine

## 2014-10-26 ENCOUNTER — Encounter: Payer: Self-pay | Admitting: Internal Medicine

## 2014-10-26 ENCOUNTER — Ambulatory Visit (INDEPENDENT_AMBULATORY_CARE_PROVIDER_SITE_OTHER): Payer: Medicare Other | Admitting: *Deleted

## 2014-10-26 ENCOUNTER — Ambulatory Visit (INDEPENDENT_AMBULATORY_CARE_PROVIDER_SITE_OTHER): Payer: Medicare Other | Admitting: Internal Medicine

## 2014-10-26 VITALS — BP 124/82 | HR 64 | Ht 71.5 in | Wt 167.2 lb

## 2014-10-26 DIAGNOSIS — Z950 Presence of cardiac pacemaker: Secondary | ICD-10-CM | POA: Diagnosis not present

## 2014-10-26 DIAGNOSIS — I429 Cardiomyopathy, unspecified: Secondary | ICD-10-CM

## 2014-10-26 DIAGNOSIS — Z954 Presence of other heart-valve replacement: Secondary | ICD-10-CM | POA: Diagnosis not present

## 2014-10-26 DIAGNOSIS — Z5181 Encounter for therapeutic drug level monitoring: Secondary | ICD-10-CM

## 2014-10-26 DIAGNOSIS — I442 Atrioventricular block, complete: Secondary | ICD-10-CM | POA: Diagnosis not present

## 2014-10-26 DIAGNOSIS — I472 Ventricular tachycardia, unspecified: Secondary | ICD-10-CM

## 2014-10-26 DIAGNOSIS — I10 Essential (primary) hypertension: Secondary | ICD-10-CM

## 2014-10-26 DIAGNOSIS — I4729 Other ventricular tachycardia: Secondary | ICD-10-CM

## 2014-10-26 DIAGNOSIS — I428 Other cardiomyopathies: Secondary | ICD-10-CM

## 2014-10-26 DIAGNOSIS — I441 Atrioventricular block, second degree: Secondary | ICD-10-CM

## 2014-10-26 DIAGNOSIS — Z952 Presence of prosthetic heart valve: Secondary | ICD-10-CM

## 2014-10-26 LAB — POCT INR: INR: 3.5

## 2014-10-26 LAB — MDC_IDC_ENUM_SESS_TYPE_INCLINIC
Battery Remaining Longevity: 98.4 mo
Battery Voltage: 2.93 V
Brady Statistic RA Percent Paced: 76 %
Brady Statistic RV Percent Paced: 0.53 %
Date Time Interrogation Session: 20160425165424
Lead Channel Impedance Value: 462.5 Ohm
Lead Channel Pacing Threshold Pulse Width: 0.5 ms
Lead Channel Pacing Threshold Pulse Width: 0.5 ms
Lead Channel Sensing Intrinsic Amplitude: 12 mV
Lead Channel Setting Pacing Amplitude: 2.5 V
Lead Channel Setting Sensing Sensitivity: 2 mV
MDC IDC MSMT LEADCHNL RA PACING THRESHOLD AMPLITUDE: 0.75 V
MDC IDC MSMT LEADCHNL RA SENSING INTR AMPL: 4.1 mV
MDC IDC MSMT LEADCHNL RV IMPEDANCE VALUE: 525 Ohm
MDC IDC MSMT LEADCHNL RV PACING THRESHOLD AMPLITUDE: 0.75 V
MDC IDC PG SERIAL: 7364115
MDC IDC SET LEADCHNL RA PACING AMPLITUDE: 2 V
MDC IDC SET LEADCHNL RV PACING PULSEWIDTH: 0.5 ms

## 2014-10-26 NOTE — Patient Instructions (Signed)
Medication Instructions:  Your physician recommends that you continue on your current medications as directed. Please refer to the Current Medication list given to you today.   Labwork: Your physician recommends that you return for lab work today: BMP   Testing/Procedures: None ordered  Follow-Up: Your physician recommends that you schedule a follow-up appointment in: 3 months with Dr Johney Frame   Any Other Special Instructions Will Be Listed Below (If Applicable).

## 2014-10-26 NOTE — Progress Notes (Signed)
Electrophysiology Office Note   Date:  10/26/2014   ID:  Noah Thompson, DOB [birth year], MRN [id number]  PCP:  [name], MD   Primary Electrophysiologist: [name], MD    Chief Complaint  Patient presents with  . 2nd degree AV block     History of Present Illness: Noah Thompson is a 74 y.o. male who presents today for electrophysiology evaluation.   He was recently hospitalized for CHF.  He has been diligent with sodium restriction and is doing better now.  He is active.  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 Today, he denies symptoms of palpitations, chest pain, shortness of breath, orthopnea, PND, lower extremity edema, claudication, dizziness, presyncope, syncope, bleeding, or neurologic sequela. The patient is tolerating medications without difficulties and is otherwise without complaint today.    Past Medical History  Diagnosis Date  . Hyperlipidemia   . Hypertension   . Depression   . S/P aortic valve replacement     a. 1993 s/p SJM mechanical AVR for aortic stenosis - chronic coumadin.  . Complete heart block     a. 1993 s/p PPM 2/2 perioperative heart block;  b. 01/2012 s/p SJM [employer] DC PPM upgrade.  Marland Kitchen History of pneumonia   . History of nephritis     a. as child.  Marland Kitchen NICM (nonischemic cardiomyopathy)     a. 06/2012 Echo: EF 45-50%;  b. 08/2013 Echo: EF 20-25%;  c. 10/2013 Adenosine MV: Fixed septal defect likely representing LBBB-related artifact, no reversible ischemia, EF 43%, low risk.  . Paroxysmal atrial tachycardia     a. 07/2013 noted on device interrogation.  . CHF (congestive heart failure)   . Shortness of breath dyspnea   . GERD (gastroesophageal reflux disease)   . RA (rheumatoid arthritis)   . Presence of permanent cardiac pacemaker 2013   Past Surgical History  Procedure Laterality Date  . Aortic valve replacement  1993    St. Jude Mechanical Valve  .  Pacemaker insertion  1993    for AV block, not pacemaker dependant  . Anal fissure repair  1970s  . Permanent pacemaker generator change N/A 01/25/2012    Procedure: PERMANENT PACEMAKER GENERATOR CHANGE;  Surgeon: [name], MD;  Location: [facility] CATH LAB;  Service: Cardiovascular;  Laterality: N/A;  . Insert / replace / remove pacemaker       Current Outpatient Prescriptions  Medication Sig Dispense Refill  . alum & mag hydroxide-simeth (MAALOX/MYLANTA) 200-200-20 MG/5ML suspension Take 15 mLs by mouth every 6 (six) hours as needed for indigestion or heartburn.    . fish oil-omega-3 fatty acids 1000 MG capsule Take 2 g by mouth daily.     . furosemide (LASIX) 20 MG tablet Take 1 tablet (20 mg total) by mouth daily. 30 tablet 5  . losartan (COZAAR) 100 MG tablet Take 1 tablet (100 mg total) by mouth daily. 30 tablet 5  . metoprolol succinate (TOPROL-XL) 50 MG 24 hr tablet Take 1 tablet (50 mg total) by mouth daily. Take with or immediately following a meal. 90 tablet 3  . ranitidine (ZANTAC) 150 MG capsule Take 150 mg by mouth as needed for heartburn.    . spironolactone (ALDACTONE) 25 MG tablet Take 1 tablet (25 mg total) by mouth daily. 30 tablet 5  . warfarin (COUMADIN) 5 MG tablet TAKE AS DIRECTED BY COUMADIN CLINIC 40 tablet 2   No current facility-administered medications  for this visit.    Allergies:   Coreg and Lisinopril   Social History:  The patient  reports that he has never smoked. He has never used smokeless tobacco. He reports that he does not drink alcohol or use illicit drugs.   Family History:  The patient's family history includes Arthritis in his father and mother; Cancer (age of onset: 46) in his father; Heart disease in his father; Hypertension in his father and mother. There is no history of Coronary artery disease, Stroke, or Diabetes.    ROS:  Please see the history of present illness.   All other systems are reviewed and negative.    PHYSICAL EXAM: VS:  BP  124/82 mmHg  Pulse 64  Ht 5' 11.5" (1.816 m)  Wt 167 lb 3.2 oz (75.841 kg)  BMI 23.00 kg/m2 , BMI Body mass index is 23 kg/(m^2). GEN: Well nourished, well developed, in no acute distress HEENT: normal Neck: no JVD, carotid bruits, or masses Cardiac: RRR; no murmurs, rubs, or gallops,no edema  Respiratory:  clear to auscultation bilaterally, normal work of breathing GI: soft, nontender, nondistended, + BS MS: no deformity or atrophy Skin: warm and dry, device pocket is well healed Neuro:  Strength and sensation are intact Psych: euthymic mood, full affect  Device interrogation is reviewed today in detail.  See PaceArt for details.   Recent Labs: 09/17/2014: ALT 20; B Natriuretic Peptide 1051.1*; Hemoglobin 13.6; Platelets 234; TSH 1.066 09/19/2014: BUN 18; Creatinine 1.28; Potassium 4.0; Sodium 139    Lipid Panel     Component Value Date/Time   CHOL 203* 01/23/2014 1657   TRIG 142 01/23/2014 1657   HDL 46 01/23/2014 1657   CHOLHDL 4.4 01/23/2014 1657   VLDL 28 01/23/2014 1657   LDLCALC 129* 01/23/2014 1657   LDLDIRECT 140.7 12/14/2011 1022     Wt Readings from Last 3 Encounters:  10/26/14 167 lb 3.2 oz (75.841 kg)  09/19/14 158 lb 4.8 oz (71.804 kg)  04/24/14 183 lb (83.008 kg)      Other studies Reviewed: Additional studies/ records that were reviewed today include: recent hospital records and echo  Review of the above records today demonstrates: CHF hospitalization, EF 25%   ASSESSMENT AND PLAN:  1, Second degree AV block Normal pacemaker function See Pace Art report No changes today With prior changes, he no longer V paces. We have previously discussed CRT upgrade.  We will again discuss upon return if QRS is wide at that time.  2. Chronic systolic dysfunction/ Nonischemic CM Stable No change required today Check bmet today  3. HTN Stable No change required today  4. Mechanical aortic valve Continue coumadin Moderate stenosis by echo.  Consider  TEE if symptoms worsen.  Merlin Return to see me in 3 months  Current medicines are reviewed at length with the patient today.   The patient does not have concerns regarding his medicines.  The following changes were made today:  none  Labs/ tests ordered today include:  Orders Placed This Encounter  Procedures  . Basic metabolic panel  . Implantable device check    Signed, [name], MD  10/26/2014 5:18 PM     [facility] [address] [place] [address] [phone] (office) [phone] (fax)

## 2014-10-27 LAB — BASIC METABOLIC PANEL
BUN: 27 mg/dL — AB (ref 6–23)
CHLORIDE: 102 meq/L (ref 96–112)
CO2: 27 meq/L (ref 19–32)
Calcium: 9.7 mg/dL (ref 8.4–10.5)
Creatinine, Ser: 1.39 mg/dL (ref 0.40–1.50)
GFR: 53.11 mL/min — ABNORMAL LOW (ref >60.00)
Glucose, Bld: 89 mg/dL (ref 70–99)
Potassium: 5.1 mEq/L (ref 3.5–5.1)
Sodium: 138 mEq/L (ref 135–145)

## 2014-11-02 ENCOUNTER — Telehealth: Payer: Self-pay | Admitting: Internal Medicine

## 2014-11-02 NOTE — Telephone Encounter (Signed)
New message     Patient calling back to speak with nurse from nurse.

## 2014-11-02 NOTE — Telephone Encounter (Signed)
Informed patient of results/KLanier,RN  

## 2014-12-02 ENCOUNTER — Ambulatory Visit (INDEPENDENT_AMBULATORY_CARE_PROVIDER_SITE_OTHER): Payer: Medicare Other | Admitting: *Deleted

## 2014-12-02 DIAGNOSIS — Z952 Presence of prosthetic heart valve: Secondary | ICD-10-CM

## 2014-12-02 DIAGNOSIS — Z5181 Encounter for therapeutic drug level monitoring: Secondary | ICD-10-CM

## 2014-12-02 DIAGNOSIS — Z954 Presence of other heart-valve replacement: Secondary | ICD-10-CM | POA: Diagnosis not present

## 2014-12-02 LAB — POCT INR: INR: 5.1

## 2014-12-16 ENCOUNTER — Ambulatory Visit (INDEPENDENT_AMBULATORY_CARE_PROVIDER_SITE_OTHER): Payer: Medicare Other | Admitting: *Deleted

## 2014-12-16 DIAGNOSIS — Z5181 Encounter for therapeutic drug level monitoring: Secondary | ICD-10-CM | POA: Diagnosis not present

## 2014-12-16 DIAGNOSIS — Z952 Presence of prosthetic heart valve: Secondary | ICD-10-CM

## 2014-12-16 DIAGNOSIS — Z954 Presence of other heart-valve replacement: Secondary | ICD-10-CM | POA: Diagnosis not present

## 2014-12-16 LAB — POCT INR: INR: 3.6

## 2014-12-30 ENCOUNTER — Ambulatory Visit (INDEPENDENT_AMBULATORY_CARE_PROVIDER_SITE_OTHER): Payer: Medicare Other | Admitting: *Deleted

## 2014-12-30 ENCOUNTER — Telehealth: Payer: Self-pay | Admitting: *Deleted

## 2014-12-30 DIAGNOSIS — Z952 Presence of prosthetic heart valve: Secondary | ICD-10-CM

## 2014-12-30 DIAGNOSIS — Z954 Presence of other heart-valve replacement: Secondary | ICD-10-CM | POA: Diagnosis not present

## 2014-12-30 DIAGNOSIS — Z5181 Encounter for therapeutic drug level monitoring: Secondary | ICD-10-CM

## 2014-12-30 LAB — POCT INR: INR: 3.4

## 2014-12-30 NOTE — Telephone Encounter (Signed)
[name] states forgot to take any of his medications yesterday until 8pm last night . Instructed to just take medications an hour earlier each day until gets back to original time that he took medications and he states understanding

## 2015-01-21 ENCOUNTER — Ambulatory Visit (INDEPENDENT_AMBULATORY_CARE_PROVIDER_SITE_OTHER): Payer: Medicare Other

## 2015-01-21 DIAGNOSIS — Z952 Presence of prosthetic heart valve: Secondary | ICD-10-CM

## 2015-01-21 DIAGNOSIS — Z954 Presence of other heart-valve replacement: Secondary | ICD-10-CM | POA: Diagnosis not present

## 2015-01-21 DIAGNOSIS — Z5181 Encounter for therapeutic drug level monitoring: Secondary | ICD-10-CM | POA: Diagnosis not present

## 2015-01-21 LAB — POCT INR: INR: 2.5

## 2015-01-27 ENCOUNTER — Ambulatory Visit (INDEPENDENT_AMBULATORY_CARE_PROVIDER_SITE_OTHER): Payer: Medicare Other | Admitting: Internal Medicine

## 2015-01-27 ENCOUNTER — Encounter: Payer: Self-pay | Admitting: Internal Medicine

## 2015-01-27 VITALS — BP 102/60 | HR 61 | Ht 71.5 in | Wt 169.2 lb

## 2015-01-27 DIAGNOSIS — I428 Other cardiomyopathies: Secondary | ICD-10-CM

## 2015-01-27 DIAGNOSIS — I429 Cardiomyopathy, unspecified: Secondary | ICD-10-CM

## 2015-01-27 DIAGNOSIS — I10 Essential (primary) hypertension: Secondary | ICD-10-CM

## 2015-01-27 DIAGNOSIS — I442 Atrioventricular block, complete: Secondary | ICD-10-CM

## 2015-01-27 NOTE — Patient Instructions (Addendum)
Medication Instructions:  Your physician recommends that you continue on your current medications as directed. Please refer to the Current Medication list given to you today. HOLD Furosemide for 2 days  Labwork: Your physician recommends that you return for lab work today: BMP/CBC   Testing/Procedures Your physician has requested that you have an echocardiogram. Echocardiography is a painless test that uses sound waves to create images of your heart. It provides your doctor with information about the size and shape of your heart and how well your heart's chambers and valves are working. This procedure takes approximately one hour. There are no restrictions for this procedure---in 6 months prior to appointment.    Follow-Up: Remote monitoring is used to monitor your pacemaker from home. This monitoring reduces the number of office visits required to check your device to one time per year. It allows [place] to keep an eye on the functioning of your device to ensure it is working properly. You are scheduled for a device check from home on 04/28/2015. You may send your transmission at any time that day. If you have a wireless device, the transmission will be sent automatically. After your physician reviews your transmission, you will receive a postcard with your next transmission date.  Your physician recommends that you schedule a follow-up appointment in: 6 months with Dr.Allred  Any Other Special Instructions Will Be Listed Below (If Applicable).

## 2015-01-27 NOTE — Progress Notes (Signed)
Electrophysiology Office Note   Date:  01/27/2015   ID:  Noah Thompson, DOB 1940-09-21, MRN [id number]  PCP:  [name], MD   Primary Electrophysiologist: [name], MD    Chief Complaint  Patient presents with  . CHB  . NICM     History of Present Illness: Noah Thompson is a 74 y.o. male who presents today for electrophysiology evaluation.  He is doing well.  He is active.  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 Today, he denies symptoms of palpitations, chest pain, shortness of breath, orthopnea, PND, lower extremity edema, claudication, dizziness, presyncope, syncope, bleeding, or neurologic sequela. The patient is tolerating medications without difficulties and is otherwise without complaint today.    Past Medical History  Diagnosis Date  . Hyperlipidemia   . Hypertension   . Depression   . S/P aortic valve replacement     a. 1993 s/p SJM mechanical AVR for aortic stenosis - chronic coumadin.  . Complete heart block     a. 1993 s/p PPM 2/2 perioperative heart block;  b. 01/2012 s/p SJM Accent DC PPM upgrade.  Marland Kitchen History of pneumonia   . History of nephritis     a. as child.  Marland Kitchen NICM (nonischemic cardiomyopathy)     a. 06/2012 Echo: EF 45-50%;  b. 08/2013 Echo: EF 20-25%;  c. 10/2013 Adenosine MV: Fixed septal defect likely representing LBBB-related artifact, no reversible ischemia, EF 43%, low risk.  . Paroxysmal atrial tachycardia     a. 07/2013 noted on device interrogation.  . CHF (congestive heart failure)   . Shortness of breath dyspnea   . GERD (gastroesophageal reflux disease)   . RA (rheumatoid arthritis)   . Presence of permanent cardiac pacemaker 2013   Past Surgical History  Procedure Laterality Date  . Aortic valve replacement  1993    St. Jude Mechanical Valve  . Pacemaker insertion  1993    for AV block, not pacemaker dependant  . Anal fissure repair  1970s  .  Permanent pacemaker generator change N/A 01/25/2012    Procedure: PERMANENT PACEMAKER GENERATOR CHANGE;  Surgeon: [name], MD;  Location: [facility] CATH LAB;  Service: Cardiovascular;  Laterality: N/A;  . Insert / replace / remove pacemaker       Current Outpatient Prescriptions  Medication Sig Dispense Refill  . alum & mag hydroxide-simeth (MAALOX/MYLANTA) 200-200-20 MG/5ML suspension Take 15 mLs by mouth every 6 (six) hours as needed for indigestion or heartburn.    . furosemide (LASIX) 20 MG tablet Take 1 tablet (20 mg total) by mouth daily. 30 tablet 5  . losartan (COZAAR) 100 MG tablet Take 1 tablet (100 mg total) by mouth daily. 30 tablet 5  . metoprolol succinate (TOPROL-XL) 50 MG 24 hr tablet Take 1 tablet (50 mg total) by mouth daily. Take with or immediately following a meal. 90 tablet 3  . ranitidine (ZANTAC) 150 MG capsule Take 150 mg by mouth daily as needed for heartburn.     . spironolactone (ALDACTONE) 25 MG tablet Take 1 tablet (25 mg total) by mouth daily. 30 tablet 5  . warfarin (COUMADIN) 5 MG tablet TAKE AS DIRECTED BY COUMADIN CLINIC 40 tablet 2   No current facility-administered medications for this visit.    Allergies:   Coreg and Lisinopril   Social History:  The patient  reports that he has never smoked. He has never used smokeless tobacco.  He reports that he does not drink alcohol or use illicit drugs.   Family History:  The patient's family history includes Arthritis in his father and mother; Cancer (age of onset: 53) in his father; Heart disease in his father; Hypertension in his father and mother. There is no history of Coronary artery disease, Stroke, or Diabetes.    ROS:  Please see the history of present illness.   All other systems are reviewed and negative.    PHYSICAL EXAM: VS:  BP 102/60 mmHg  Pulse 61  Ht 5' 11.5" (1.816 m)  Wt 76.749 kg (169 lb 3.2 oz)  BMI 23.27 kg/m2 , BMI Body mass index is 23.27 kg/(m^2). GEN: Well nourished, well developed,  in no acute distress HEENT: normal Neck: no JVD, carotid bruits, or masses Cardiac: RRR; no murmurs, rubs, or gallops,no edema  Respiratory:  clear to auscultation bilaterally, normal work of breathing GI: soft, nontender, nondistended, + BS MS: no deformity or atrophy Skin: warm and dry, device pocket is well healed Neuro:  Strength and sensation are intact Psych: euthymic mood, full affect  Device interrogation is reviewed today in detail.  See PaceArt for details. 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  Recent Labs: 09/17/2014: ALT 20; B Natriuretic Peptide 1051.1*; Hemoglobin 13.6; Platelets 234; TSH 1.066 10/26/2014: BUN 27*; Creatinine, Ser 1.39; Potassium 5.1; Sodium 138    Lipid Panel     Component Value Date/Time   CHOL 203* 01/23/2014 1657   TRIG 142 01/23/2014 1657   HDL 46 01/23/2014 1657   CHOLHDL 4.4 01/23/2014 1657   VLDL 28 01/23/2014 1657   LDLCALC 129* 01/23/2014 1657   LDLDIRECT 140.7 12/14/2011 1022     Wt Readings from Last 3 Encounters:  01/27/15 76.749 kg (169 lb 3.2 oz)  10/26/14 75.841 kg (167 lb 3.2 oz)  09/19/14 71.804 kg (158 lb 4.8 oz)       ASSESSMENT AND PLAN:  1, Second degree AV block Normal pacemaker function See Pace Art report No changes today With prior changes, he no longer V paces. We have previously discussed CRT upgrade. He is doing very well and would like to avoid this if possible  2. Chronic systolic dysfunction/ Nonischemic CM Stable No change required today Check bmet today Repeat echo upon return  3. HTN Stable No change required today  4. Mechanical aortic valve Continue coumadin Moderate stenosis by echo.  Consider TEE if symptoms worsen.  Merlin Return to see me in 6 months with an echo at that time.  Current medicines are reviewed at length with the patient today.   The patient does not have concerns regarding his medicines.  The following changes were made today:  none  Labs/ tests ordered today include:   Orders Placed This Encounter  Procedures  . Basic metabolic panel  . CBC with Differential  . Implantable device check  . EKG 12-Lead  . ECHOCARDIOGRAM COMPLETE    Signed, [name], MD  01/27/2015 10:58 PM     [facility] [address] [place] [address] [phone] (office) [phone] (fax)

## 2015-01-28 LAB — BASIC METABOLIC PANEL
BUN: 41 mg/dL — ABNORMAL HIGH (ref 6–23)
CHLORIDE: 102 meq/L (ref 96–112)
CO2: 28 meq/L (ref 19–32)
CREATININE: 1.86 mg/dL — AB (ref 0.40–1.50)
Calcium: 9.5 mg/dL (ref 8.4–10.5)
GFR: 37.93 mL/min — ABNORMAL LOW (ref >60.00)
Glucose, Bld: 91 mg/dL (ref 70–99)
Potassium: 4.6 mEq/L (ref 3.5–5.1)
Sodium: 137 mEq/L (ref 135–145)

## 2015-01-28 LAB — CBC WITH DIFFERENTIAL/PLATELET
Basophils Absolute: 0.1 10*3/uL (ref 0.0–0.1)
Basophils Relative: 1 % (ref 0.0–3.0)
EOS ABS: 1 10*3/uL — AB (ref 0.0–0.7)
Eosinophils Relative: 10.2 % — ABNORMAL HIGH (ref 0.0–5.0)
HEMATOCRIT: 38.5 % — AB (ref 39.0–52.0)
Hemoglobin: 13 g/dL (ref 13.0–17.0)
LYMPHS ABS: 2.9 10*3/uL (ref 0.7–4.0)
LYMPHS PCT: 30.1 % (ref 12.0–46.0)
MCHC: 33.8 g/dL (ref 30.0–36.0)
MCV: 87.6 fl (ref 78.0–100.0)
MONOS PCT: 8.8 % (ref 3.0–12.0)
Monocytes Absolute: 0.8 10*3/uL (ref 0.1–1.0)
Neutro Abs: 4.8 10*3/uL (ref 1.4–7.7)
Neutrophils Relative %: 49.9 % (ref 43.0–77.0)
Platelets: 287 10*3/uL (ref 150.0–400.0)
RBC: 4.4 Mil/uL (ref 4.22–5.81)
RDW: 15.5 % (ref 11.5–15.5)
WBC: 9.6 10*3/uL (ref 4.0–10.5)

## 2015-01-28 LAB — CUP PACEART INCLINIC DEVICE CHECK
Brady Statistic RA Percent Paced: 65 %
Brady Statistic RV Percent Paced: 0.47 %
Date Time Interrogation Session: 20160727165333
Lead Channel Impedance Value: 525 Ohm
Lead Channel Pacing Threshold Amplitude: 1 V
Lead Channel Pacing Threshold Pulse Width: 0.5 ms
Lead Channel Sensing Intrinsic Amplitude: 11.7 mV
Lead Channel Setting Pacing Amplitude: 2 V
Lead Channel Setting Pacing Pulse Width: 0.5 ms
Lead Channel Setting Sensing Sensitivity: 2 mV
MDC IDC MSMT BATTERY REMAINING LONGEVITY: 97.2 mo
MDC IDC MSMT BATTERY VOLTAGE: 2.93 V
MDC IDC MSMT LEADCHNL RA IMPEDANCE VALUE: 450 Ohm
MDC IDC MSMT LEADCHNL RA PACING THRESHOLD AMPLITUDE: 0.75 V
MDC IDC MSMT LEADCHNL RA PACING THRESHOLD AMPLITUDE: 0.75 V
MDC IDC MSMT LEADCHNL RA PACING THRESHOLD PULSEWIDTH: 0.5 ms
MDC IDC MSMT LEADCHNL RA PACING THRESHOLD PULSEWIDTH: 0.5 ms
MDC IDC MSMT LEADCHNL RA SENSING INTR AMPL: 4 mV
MDC IDC MSMT LEADCHNL RV PACING THRESHOLD AMPLITUDE: 1 V
MDC IDC MSMT LEADCHNL RV PACING THRESHOLD PULSEWIDTH: 0.5 ms
MDC IDC PG SERIAL: 7364115
MDC IDC SET LEADCHNL RV PACING AMPLITUDE: 2.5 V
Pulse Gen Model: 2210

## 2015-02-05 ENCOUNTER — Other Ambulatory Visit: Payer: Self-pay | Admitting: *Deleted

## 2015-02-05 DIAGNOSIS — I428 Other cardiomyopathies: Secondary | ICD-10-CM

## 2015-02-09 ENCOUNTER — Other Ambulatory Visit (INDEPENDENT_AMBULATORY_CARE_PROVIDER_SITE_OTHER): Payer: Medicare Other | Admitting: *Deleted

## 2015-02-09 DIAGNOSIS — I429 Cardiomyopathy, unspecified: Secondary | ICD-10-CM | POA: Diagnosis not present

## 2015-02-09 DIAGNOSIS — I428 Other cardiomyopathies: Secondary | ICD-10-CM

## 2015-02-09 LAB — BASIC METABOLIC PANEL
BUN: 32 mg/dL — ABNORMAL HIGH (ref 6–23)
CALCIUM: 9.7 mg/dL (ref 8.4–10.5)
CHLORIDE: 103 meq/L (ref 96–112)
CO2: 27 mEq/L (ref 19–32)
Creatinine, Ser: 1.55 mg/dL — ABNORMAL HIGH (ref 0.40–1.50)
GFR: 46.8 mL/min — AB (ref >60.00)
Glucose, Bld: 97 mg/dL (ref 70–99)
Potassium: 4.5 mEq/L (ref 3.5–5.1)
Sodium: 136 mEq/L (ref 135–145)

## 2015-02-11 ENCOUNTER — Other Ambulatory Visit: Payer: Self-pay | Admitting: Internal Medicine

## 2015-02-11 ENCOUNTER — Other Ambulatory Visit: Payer: Self-pay | Admitting: Cardiology

## 2015-02-18 ENCOUNTER — Ambulatory Visit (INDEPENDENT_AMBULATORY_CARE_PROVIDER_SITE_OTHER): Payer: Medicare Other | Admitting: *Deleted

## 2015-02-18 DIAGNOSIS — Z5181 Encounter for therapeutic drug level monitoring: Secondary | ICD-10-CM

## 2015-02-18 DIAGNOSIS — Z954 Presence of other heart-valve replacement: Secondary | ICD-10-CM | POA: Diagnosis not present

## 2015-02-18 DIAGNOSIS — Z952 Presence of prosthetic heart valve: Secondary | ICD-10-CM

## 2015-02-18 LAB — POCT INR: INR: 3

## 2015-03-16 ENCOUNTER — Other Ambulatory Visit: Payer: Self-pay | Admitting: Cardiology

## 2015-03-25 ENCOUNTER — Ambulatory Visit (INDEPENDENT_AMBULATORY_CARE_PROVIDER_SITE_OTHER): Payer: Medicare Other | Admitting: *Deleted

## 2015-03-25 DIAGNOSIS — Z954 Presence of other heart-valve replacement: Secondary | ICD-10-CM

## 2015-03-25 DIAGNOSIS — Z952 Presence of prosthetic heart valve: Secondary | ICD-10-CM

## 2015-03-25 DIAGNOSIS — Z5181 Encounter for therapeutic drug level monitoring: Secondary | ICD-10-CM | POA: Diagnosis not present

## 2015-03-25 LAB — POCT INR: INR: 4.2

## 2015-04-09 ENCOUNTER — Ambulatory Visit (INDEPENDENT_AMBULATORY_CARE_PROVIDER_SITE_OTHER): Payer: Medicare Other | Admitting: *Deleted

## 2015-04-09 DIAGNOSIS — Z5181 Encounter for therapeutic drug level monitoring: Secondary | ICD-10-CM | POA: Diagnosis not present

## 2015-04-09 DIAGNOSIS — Z954 Presence of other heart-valve replacement: Secondary | ICD-10-CM

## 2015-04-09 DIAGNOSIS — Z952 Presence of prosthetic heart valve: Secondary | ICD-10-CM

## 2015-04-09 LAB — POCT INR: INR: 2.8

## 2015-04-13 ENCOUNTER — Other Ambulatory Visit: Payer: Self-pay | Admitting: Internal Medicine

## 2015-04-13 DIAGNOSIS — I10 Essential (primary) hypertension: Secondary | ICD-10-CM

## 2015-04-13 MED ORDER — METOPROLOL SUCCINATE ER 50 MG PO TB24: 50.0000 mg | ORAL_TABLET | Freq: Every day | ORAL | Status: DC

## 2015-04-17 ENCOUNTER — Other Ambulatory Visit: Payer: Self-pay | Admitting: Physician Assistant

## 2015-04-28 ENCOUNTER — Ambulatory Visit (INDEPENDENT_AMBULATORY_CARE_PROVIDER_SITE_OTHER): Payer: Medicare Other | Admitting: *Deleted

## 2015-04-28 DIAGNOSIS — I442 Atrioventricular block, complete: Secondary | ICD-10-CM

## 2015-04-28 NOTE — Progress Notes (Signed)
Remote pacemaker transmission.   

## 2015-05-07 ENCOUNTER — Ambulatory Visit (INDEPENDENT_AMBULATORY_CARE_PROVIDER_SITE_OTHER): Payer: Medicare Other | Admitting: *Deleted

## 2015-05-07 DIAGNOSIS — Z5181 Encounter for therapeutic drug level monitoring: Secondary | ICD-10-CM

## 2015-05-07 DIAGNOSIS — Z954 Presence of other heart-valve replacement: Secondary | ICD-10-CM | POA: Diagnosis not present

## 2015-05-07 DIAGNOSIS — Z952 Presence of prosthetic heart valve: Secondary | ICD-10-CM

## 2015-05-07 LAB — POCT INR: INR: 3.4

## 2015-05-10 LAB — CUP PACEART REMOTE DEVICE CHECK
Battery Voltage: 2.93 V
Brady Statistic AP VP Percent: 1 %
Brady Statistic AP VS Percent: 77 %
Brady Statistic AS VP Percent: 1 %
Brady Statistic RA Percent Paced: 76 %
Brady Statistic RV Percent Paced: 1 %
Date Time Interrogation Session: 20161026073837
Implantable Lead Implant Date: 19930223
Implantable Lead Location: 753859
Lead Channel Impedance Value: 450 Ohm
Lead Channel Impedance Value: 530 Ohm
Lead Channel Pacing Threshold Amplitude: 1 V
Lead Channel Pacing Threshold Pulse Width: 0.5 ms
Lead Channel Sensing Intrinsic Amplitude: 10.4 mV
Lead Channel Setting Pacing Amplitude: 2 V
Lead Channel Setting Pacing Pulse Width: 0.5 ms
Lead Channel Setting Sensing Sensitivity: 2 mV
MDC IDC LEAD IMPLANT DT: 19930223
MDC IDC LEAD LOCATION: 753860
MDC IDC MSMT BATTERY REMAINING LONGEVITY: 94 mo
MDC IDC MSMT BATTERY REMAINING PERCENTAGE: 81 %
MDC IDC MSMT LEADCHNL RA PACING THRESHOLD AMPLITUDE: 0.75 V
MDC IDC MSMT LEADCHNL RA SENSING INTR AMPL: 3.6 mV
MDC IDC MSMT LEADCHNL RV PACING THRESHOLD PULSEWIDTH: 0.5 ms
MDC IDC SET LEADCHNL RV PACING AMPLITUDE: 2.5 V
MDC IDC STAT BRADY AS VS PERCENT: 21 %
Pulse Gen Model: 2210
Pulse Gen Serial Number: 7364115

## 2015-05-11 ENCOUNTER — Encounter: Payer: Self-pay | Admitting: Cardiology

## 2015-06-11 ENCOUNTER — Other Ambulatory Visit: Payer: Self-pay | Admitting: Internal Medicine

## 2015-07-13 ENCOUNTER — Other Ambulatory Visit: Payer: Self-pay | Admitting: Internal Medicine

## 2015-07-15 ENCOUNTER — Encounter: Payer: Self-pay | Admitting: *Deleted

## 2015-07-23 ENCOUNTER — Ambulatory Visit (INDEPENDENT_AMBULATORY_CARE_PROVIDER_SITE_OTHER): Payer: Medicare Other | Admitting: Pharmacist

## 2015-07-23 DIAGNOSIS — Z954 Presence of other heart-valve replacement: Secondary | ICD-10-CM | POA: Diagnosis not present

## 2015-07-23 DIAGNOSIS — Z952 Presence of prosthetic heart valve: Secondary | ICD-10-CM

## 2015-07-23 DIAGNOSIS — Z5181 Encounter for therapeutic drug level monitoring: Secondary | ICD-10-CM

## 2015-07-23 LAB — POCT INR: INR: 2

## 2015-07-26 ENCOUNTER — Encounter: Payer: Medicare Other | Admitting: Internal Medicine

## 2015-08-13 ENCOUNTER — Other Ambulatory Visit: Payer: Self-pay | Admitting: Internal Medicine

## 2015-08-16 ENCOUNTER — Ambulatory Visit (INDEPENDENT_AMBULATORY_CARE_PROVIDER_SITE_OTHER): Payer: Medicare Other | Admitting: *Deleted

## 2015-08-16 ENCOUNTER — Ambulatory Visit (INDEPENDENT_AMBULATORY_CARE_PROVIDER_SITE_OTHER): Payer: Medicare Other | Admitting: Internal Medicine

## 2015-08-16 ENCOUNTER — Encounter: Payer: Self-pay | Admitting: Internal Medicine

## 2015-08-16 VITALS — BP 116/84 | HR 72 | Ht 71.5 in | Wt 179.8 lb

## 2015-08-16 DIAGNOSIS — I10 Essential (primary) hypertension: Secondary | ICD-10-CM | POA: Diagnosis not present

## 2015-08-16 DIAGNOSIS — I429 Cardiomyopathy, unspecified: Secondary | ICD-10-CM | POA: Diagnosis not present

## 2015-08-16 DIAGNOSIS — I472 Ventricular tachycardia, unspecified: Secondary | ICD-10-CM

## 2015-08-16 DIAGNOSIS — Z954 Presence of other heart-valve replacement: Secondary | ICD-10-CM

## 2015-08-16 DIAGNOSIS — Z5181 Encounter for therapeutic drug level monitoring: Secondary | ICD-10-CM

## 2015-08-16 DIAGNOSIS — I441 Atrioventricular block, second degree: Secondary | ICD-10-CM | POA: Diagnosis not present

## 2015-08-16 DIAGNOSIS — I158 Other secondary hypertension: Secondary | ICD-10-CM

## 2015-08-16 DIAGNOSIS — Z952 Presence of prosthetic heart valve: Secondary | ICD-10-CM

## 2015-08-16 DIAGNOSIS — I442 Atrioventricular block, complete: Secondary | ICD-10-CM

## 2015-08-16 DIAGNOSIS — I428 Other cardiomyopathies: Secondary | ICD-10-CM

## 2015-08-16 DIAGNOSIS — I050 Rheumatic mitral stenosis: Secondary | ICD-10-CM

## 2015-08-16 LAB — CUP PACEART INCLINIC DEVICE CHECK
Battery Voltage: 2.93 V
Implantable Lead Implant Date: 19930223
Implantable Lead Location: 753859
Implantable Lead Location: 753860
Lead Channel Impedance Value: 450 Ohm
Lead Channel Impedance Value: 525 Ohm
Lead Channel Pacing Threshold Amplitude: 1 V
Lead Channel Pacing Threshold Amplitude: 1 V
Lead Channel Pacing Threshold Pulse Width: 0.5 ms
Lead Channel Pacing Threshold Pulse Width: 0.5 ms
Lead Channel Sensing Intrinsic Amplitude: 12 mV
Lead Channel Setting Pacing Amplitude: 2 V
Lead Channel Setting Pacing Amplitude: 2.5 V
Lead Channel Setting Pacing Pulse Width: 0.5 ms
Lead Channel Setting Sensing Sensitivity: 2 mV
MDC IDC LEAD IMPLANT DT: 19930223
MDC IDC MSMT BATTERY REMAINING LONGEVITY: 99.6
MDC IDC MSMT LEADCHNL RA SENSING INTR AMPL: 4.5 mV
MDC IDC SESS DTM: 20170213174827
MDC IDC STAT BRADY RA PERCENT PACED: 78 %
MDC IDC STAT BRADY RV PERCENT PACED: 0.5 %
Pulse Gen Model: 2210
Pulse Gen Serial Number: 7364115

## 2015-08-16 LAB — BASIC METABOLIC PANEL
BUN: 31 mg/dL — ABNORMAL HIGH (ref 7–25)
CO2: 27 mmol/L (ref 20–31)
Calcium: 9.4 mg/dL (ref 8.6–10.3)
Chloride: 105 mmol/L (ref 98–110)
Creat: 1.54 mg/dL — ABNORMAL HIGH (ref 0.70–1.18)
GLUCOSE: 94 mg/dL (ref 65–99)
Potassium: 4.6 mmol/L (ref 3.5–5.3)
SODIUM: 138 mmol/L (ref 135–146)

## 2015-08-16 LAB — POCT INR: INR: 4.5

## 2015-08-16 NOTE — Progress Notes (Signed)
Electrophysiology Office Note   Date:  08/16/2015   ID:  Noah Thompson, DOB 03/24/1941, MRN [id number]  PCP:  [name], MD   Primary Electrophysiologist: [name], MD    Chief Complaint  Patient presents with  . CHB     History of Present Illness: Noah Thompson is a 75 y.o. male who presents today for electrophysiology evaluation.  He is doing well.  He is active.  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  We discussed a ferari break caliper that he recently repaired.  Does not presently have cardiac limitation. Today, he denies symptoms of palpitations, chest pain, shortness of breath, orthopnea, PND, lower extremity edema, claudication, dizziness, presyncope, syncope, bleeding, or neurologic sequela. The patient is tolerating medications without difficulties and is otherwise without complaint today.    Past Medical History  Diagnosis Date  . Hyperlipidemia   . Hypertension   . Depression   . S/P aortic valve replacement     a. 1993 s/p SJM mechanical AVR for aortic stenosis - chronic coumadin.  . Complete heart block (HCC)     a. 1993 s/p PPM 2/2 perioperative heart block;  b. 01/2012 s/p SJM Accent DC PPM upgrade.  Marland Kitchen History of pneumonia   . History of nephritis     a. as child.  Marland Kitchen NICM (nonischemic cardiomyopathy) (HCC)     a. 06/2012 Echo: EF 45-50%;  b. 08/2013 Echo: EF 20-25%;  c. 10/2013 Adenosine MV: Fixed septal defect likely representing LBBB-related artifact, no reversible ischemia, EF 43%, low risk.  . Paroxysmal atrial tachycardia (HCC)     a. 07/2013 noted on device interrogation.  . CHF (congestive heart failure) (HCC)   . Shortness of breath dyspnea   . GERD (gastroesophageal reflux disease)   . RA (rheumatoid arthritis) (HCC)   . Presence of permanent cardiac pacemaker 2013   Past Surgical History  Procedure Laterality Date  . Aortic valve replacement  1993    St.  Jude Mechanical Valve  . Pacemaker insertion  1993    for AV block, not pacemaker dependant  . Anal fissure repair  1970s  . Permanent pacemaker generator change N/A 01/25/2012    Procedure: PERMANENT PACEMAKER GENERATOR CHANGE;  Surgeon: [name], MD;  Location: [facility] CATH LAB;  Service: Cardiovascular;  Laterality: N/A;  . Insert / replace / remove pacemaker       Current Outpatient Prescriptions  Medication Sig Dispense Refill  . alum & mag hydroxide-simeth (MAALOX/MYLANTA) 200-200-20 MG/5ML suspension Take 15 mLs by mouth every 6 (six) hours as needed for indigestion or heartburn.    . furosemide (LASIX) 20 MG tablet TAKE ONE TABLET BY MOUTH ONCE DAILY 30 tablet 7  . losartan (COZAAR) 100 MG tablet TAKE ONE TABLET BY MOUTH ONCE DAILY 30 tablet 5  . metoprolol succinate (TOPROL-XL) 50 MG 24 hr tablet TAKE 1 TABLET BY MOUTH DAILY WITH OR IMMEDIATELY FOLLOWING A MEAL. 90 tablet 0  . ranitidine (ZANTAC) 150 MG capsule Take 150 mg by mouth daily as needed for heartburn.     . spironolactone (ALDACTONE) 25 MG tablet TAKE ONE TABLET BY MOUTH ONCE DAILY 30 tablet 7  . warfarin (COUMADIN) 5 MG tablet TAKE AS DIRECTED BY  COUMADIN  CLINIC 40 tablet 1   No current facility-administered medications for this visit.    Allergies:   Coreg and Lisinopril   Social History:  The patient  reports that he has never smoked. He has never used smokeless tobacco. He reports that he does not drink alcohol or use illicit drugs.   Family History:  The patient's family history includes Arthritis in his father and mother; Congestive Heart Failure in his father; Heart disease in his father; Hypertension in his father and mother; Prostate cancer (age of onset: 53) in his father. There is no history of Coronary artery disease, Stroke, or Diabetes.    ROS:  Please see the history of present illness.   All other systems are reviewed and negative.    PHYSICAL EXAM: VS:  BP 116/84 mmHg  Pulse 72  Ht 5' 11.5"  (1.816 m)  Wt 179 lb 12.8 oz (81.557 kg)  BMI 24.73 kg/m2 , BMI Body mass index is 24.73 kg/(m^2). GEN: Well nourished, well developed, in no acute distress HEENT: normal Neck: no JVD, carotid bruits, or masses Cardiac: RRR; 2/6 diastolic murmur LLSB Respiratory:  clear to auscultation bilaterally, normal work of breathing GI: soft, nontender, nondistended, + BS MS: no deformity or atrophy Skin: warm and dry, device pocket is well healed Neuro:  Strength and sensation are intact Psych: euthymic mood, full affect  Device interrogation is reviewed today in detail.  See PaceArt for details.  Recent Labs: 09/17/2014: ALT 20; B Natriuretic Peptide 1051.1*; TSH 1.066 01/27/2015: Hemoglobin 13.0; Platelets 287.0 02/09/2015: BUN 32*; Creatinine, Ser 1.55*; Potassium 4.5; Sodium 136    Lipid Panel     Component Value Date/Time   CHOL 203* 01/23/2014 1657   TRIG 142 01/23/2014 1657   HDL 46 01/23/2014 1657   CHOLHDL 4.4 01/23/2014 1657   VLDL 28 01/23/2014 1657   LDLCALC 129* 01/23/2014 1657   LDLDIRECT 140.7 12/14/2011 1022     Wt Readings from Last 3 Encounters:  08/16/15 179 lb 12.8 oz (81.557 kg)  01/27/15 169 lb 3.2 oz (76.749 kg)  10/26/14 167 lb 3.2 oz (75.841 kg)       ASSESSMENT AND PLAN:  1, Second degree AV block Normal pacemaker function See Pace Art report No changes today With prior changes, he no longer V paces (<1%). We have previously discussed CRT upgrade. He is doing very well and would like to avoid this if possible  2. Chronic systolic dysfunction/ Nonischemic CM Stable No change required today Check bmet today Repeat echo at this time  3. HTN Stable No change required today bmet today  4. Mechanical aortic valve Continue coumadin Moderate stenosis by echo.  Consider TEE if symptoms worsen.  Merlin Return to see me in 6 months    Current medicines are reviewed at length with the patient today.   The patient does not have concerns regarding  his medicines.  The following changes were made today:  none  Labs/ tests ordered today include:  Orders Placed This Encounter  Procedures  . Basic metabolic panel  . ECHOCARDIOGRAM COMPLETE    Signed, [name], MD  08/16/2015 4:25 PM     [facility] [address] [place] [address] [phone] (office) [phone] (fax)

## 2015-08-16 NOTE — Patient Instructions (Addendum)
Medication Instructions:  Your physician recommends that you continue on your current medications as directed. Please refer to the Current Medication list given to you today.   Labwork: Your physician recommends that you have lab work today: BMET    Testing/Procedures: Your physician has requested that you have an echocardiogram. Echocardiography is a painless test that uses sound waves to create images of your heart. It provides your doctor with information about the size and shape of your heart and how well your heart's chambers and valves are working. This procedure takes approximately one hour. There are no restrictions for this procedure.---ORDER IN ALREADY for future from July.  Dr Johney Frame wants done the same day as Coumadin apt.    Follow-Up: Your physician wants you to follow-up in: 6 months with Dr Johney Frame. You will receive a reminder letter in the mail two months in advance. If you don't receive a letter, please call our office to schedule the follow-up appointment.   Remote monitoring is used to monitor your Pacemaker  from home. This monitoring reduces the number of office visits required to check your device to one time per year. It allows [place] to keep an eye on the functioning of your device to ensure it is working properly. You are scheduled for a device check from home on 11/15/15. You may send your transmission at any time that day. If you have a wireless device, the transmission will be sent automatically. After your physician reviews your transmission, you will receive a postcard with your next transmission date.    Any Other Special Instructions Will Be Listed Below (If Applicable).     If you need a refill on your cardiac medications before your next appointment, please call your pharmacy.

## 2015-08-30 ENCOUNTER — Other Ambulatory Visit (HOSPITAL_COMMUNITY): Payer: Medicare Other

## 2015-08-30 ENCOUNTER — Ambulatory Visit (INDEPENDENT_AMBULATORY_CARE_PROVIDER_SITE_OTHER): Payer: Medicare Other | Admitting: *Deleted

## 2015-08-30 DIAGNOSIS — Z954 Presence of other heart-valve replacement: Secondary | ICD-10-CM

## 2015-08-30 DIAGNOSIS — Z5181 Encounter for therapeutic drug level monitoring: Secondary | ICD-10-CM

## 2015-08-30 DIAGNOSIS — Z952 Presence of prosthetic heart valve: Secondary | ICD-10-CM

## 2015-08-30 LAB — POCT INR: INR: 4.1

## 2015-09-06 ENCOUNTER — Telehealth: Payer: Self-pay | Admitting: Internal Medicine

## 2015-09-06 NOTE — Telephone Encounter (Signed)
She needs you to call in the Phenergan . Please call to [phone].

## 2015-09-06 NOTE — Telephone Encounter (Signed)
New message      Pt has a virus----vomiting/diarrhea-----and he cannot take his medication.  How long can he go without taking medication before he gets in trouble?

## 2015-09-06 NOTE — Telephone Encounter (Signed)
Returned call to patient's wife and let her know it will be okay if he can't take the medications today due to stomach virus.  She will try her best to get the Coumadin in him tonight.  There whole family has had the flu and now he has.  She will call back if problems

## 2015-09-06 NOTE — Telephone Encounter (Signed)
Spoke with wife and let her know that she would need to call his PCP for that medication

## 2015-09-13 ENCOUNTER — Other Ambulatory Visit: Payer: Self-pay

## 2015-09-13 ENCOUNTER — Ambulatory Visit (HOSPITAL_COMMUNITY): Payer: Medicare Other | Attending: Cardiology

## 2015-09-13 ENCOUNTER — Ambulatory Visit (INDEPENDENT_AMBULATORY_CARE_PROVIDER_SITE_OTHER): Payer: Medicare Other | Admitting: Pharmacist

## 2015-09-13 DIAGNOSIS — I351 Nonrheumatic aortic (valve) insufficiency: Secondary | ICD-10-CM | POA: Diagnosis not present

## 2015-09-13 DIAGNOSIS — I110 Hypertensive heart disease with heart failure: Secondary | ICD-10-CM | POA: Insufficient documentation

## 2015-09-13 DIAGNOSIS — Z950 Presence of cardiac pacemaker: Secondary | ICD-10-CM | POA: Diagnosis not present

## 2015-09-13 DIAGNOSIS — I050 Rheumatic mitral stenosis: Secondary | ICD-10-CM | POA: Diagnosis not present

## 2015-09-13 DIAGNOSIS — Z954 Presence of other heart-valve replacement: Secondary | ICD-10-CM | POA: Diagnosis not present

## 2015-09-13 DIAGNOSIS — E785 Hyperlipidemia, unspecified: Secondary | ICD-10-CM | POA: Diagnosis not present

## 2015-09-13 DIAGNOSIS — I509 Heart failure, unspecified: Secondary | ICD-10-CM | POA: Insufficient documentation

## 2015-09-13 DIAGNOSIS — Z5181 Encounter for therapeutic drug level monitoring: Secondary | ICD-10-CM

## 2015-09-13 DIAGNOSIS — I340 Nonrheumatic mitral (valve) insufficiency: Secondary | ICD-10-CM | POA: Diagnosis not present

## 2015-09-13 DIAGNOSIS — I77810 Thoracic aortic ectasia: Secondary | ICD-10-CM | POA: Insufficient documentation

## 2015-09-13 DIAGNOSIS — Z952 Presence of prosthetic heart valve: Secondary | ICD-10-CM

## 2015-09-13 LAB — ECHOCARDIOGRAM COMPLETE
AO mean calculated velocity dopler: 223 cm/s
AOPV: 0.28 m/s
AV Peak grad: 36 mmHg
AVG: 22 mmHg
Ao-asc: 47 mm
CHL CUP AORTIC ROOT 2D: 34 mm
CHL CUP DOP CALC LVOT VTI: 21 cm
CHL CUP LVOT MEAN VEL: 63.2 cm/s
CHL CUP MV DEC (S): 190 ms
CHL CUP MV M VEL: 71.9
E decel time: 190 msec
EERAT: 10.06
ESTIMATED CVP: 8 mm HG
FS: 36 % (ref 28–44)
IVS/LV PW RATIO, ED: 1.04
LA SIZE INDEX: 1.28 mm/m2
LA VOL 2D INDEX: 22.6 mL/m2
LA VOL 2D: 46 mL
LASIZE: 26 mm
LAVOL: 47 mL
LAVOLIN: 23.1 mL/m2
LEFT ATRIUM END SYS DIAM: 26 mm
LV SIMPSON'S DISK: 45
LV TDI E'LATERAL: 8.52 cm/s
LV TDI E'MEDIAL: 3.5 cm/s
LV dias vol: 93 mL (ref 62–150)
LV sys vol index: 25 mL/m2
LV sys vol: 93 mL — AB (ref 21–61)
LVDIAVOLIN: 46 mL/m2
LVIDD: 47 mm — AB (ref 3.5–6.0)
LVIDS: 30 mm — AB (ref 2.1–4.0)
LVOT peak VTI: 0.3 cm
LVOTPV: 85.8 cm/s
Lateral S' vel: 10.3 cm/s
MV pk A vel: 117 cm/s
MVAP: 1.95 cm2
MVPG: 3 mmHg
MVPKEVEL: 85.7 cm/s
MVSPHT: 115 ms
Mean grad: 2 mmHg
PW: 9.25 mm — AB (ref 0.6–1.1)
PWSYS: 9.25 mm
SV INDEX: 20.6 mL/m2
Single Plane A4C EF: 41 %
Stroke v: 42 ml
TRMAXVEL: 215 cm/s
TV PEAK RV-RA GRADIENT: 18 cm/s
VTI: 70.1 cm

## 2015-09-13 LAB — POCT INR: INR: 3.8

## 2015-09-20 ENCOUNTER — Telehealth: Payer: Self-pay

## 2015-09-20 NOTE — Telephone Encounter (Signed)
PLEASE NOTE: All timestamps contained within this report are represented as [place] Standard Time. CONFIDENTIALTY NOTICE: This fax transmission is intended only for the addressee. It contains information that is legally privileged, confidential or otherwise protected from use or disclosure. If you are not the intended recipient, you are strictly prohibited from reviewing, disclosing, copying using or disseminating any of this information or taking any action in reliance on or regarding this information. If you have received this fax in error, please notify us immediately by telephone so that we can arrange for its return to [place]. Phone: [phone], Toll-Free: [phone], Fax: [phone] Page: 1 of 2 Call Id: [id number] [facility] Night - Client TELEPHONE ADVICE RECORD [facility] Patient Name: Noah Thompson Gender: Male DOB: 18-Jan-1941 Age: 75 Y 8 M 23 D Return Phone Number: [phone] (Primary), [phone] (Secondary) Address: City/State/Zip: [place] Client [facility] Night - Client Client Site [facility] [place] - Night Physician [name] Contact Type Call Who Is Calling Patient / Member / Family / Caregiver Call Type Triage / Clinical Caller Name Aurion Risi Relationship To Patient Spouse Return Phone Number [phone] (Primary) Chief Complaint Flu Symptom Reason for Call Symptomatic / Request for Health Information Initial Comment Caller states her husband has had the flu since Tuesday night. He is also a heart patient. Also sounds very congested, low grade temp. PreDisposition Home Care Translation No Nurse Assessment Nurse: Cox, RN, [employer] Date/Time (Eastern Time): 09/19/2015 1:26:58 PM Confirm and document reason for call. If symptomatic, describe symptoms. You must click the next button to save text entered. ---Caller states her husband has had the flu since Tuesday night. He  is also a heart patient. Also sounds very congested, low grade temp. Temp 98 temporal Has the patient traveled out of the country within the last 30 days? ---No Does the patient have any new or worsening symptoms? ---Yes Will a triage be completed? ---Yes Related visit to physician within the last 2 weeks? ---No Does the PT have any chronic conditions? (i.e. diabetes, asthma, etc.) ---Yes List chronic conditions. ---aortic valve replacement, pacemaker- replaced 4 yrs ago, heart failure, Is this a behavioral health or substance abuse call? ---No Guidelines Guideline Title Affirmed Question Affirmed Notes Nurse Date/Time (Eastern Time) Cough - Acute Productive Cough with cold symptoms (e.g., runny nose, postnasal drip, throat clearing) (all triage questions negative) Cox, RN, [employer] 09/19/2015 1:32:25 PM Disp. Time Lamount Cohen Time) Disposition Final User PLEASE NOTE: All timestamps contained within this report are represented as [place] Standard Time. CONFIDENTIALTY NOTICE: This fax transmission is intended only for the addressee. It contains information that is legally privileged, confidential or otherwise protected from use or disclosure. If you are not the intended recipient, you are strictly prohibited from reviewing, disclosing, copying using or disseminating any of this information or taking any action in reliance on or regarding this information. If you have received this fax in error, please notify us immediately by telephone so that we can arrange for its return to [place]. Phone: [phone], Toll-Free: [phone], Fax: [phone] Page: 2 of 2 Call Id: [id number] 09/19/2015 1:39:29 PM Home Care Yes Cox, RN, [employer] Caller Understands: Yes Disagree/Comply: Comply Care Advice Given Per Guideline HOME CARE: You should be able to treat this at home. REASSURANCE: * It doesn't sound like a serious cough. COUGH MEDICINES: - OTC COUGH SYRUPS: The most common cough suppressant in OTC  cough medications is dextromethorphan. Often the letters 'DM' appear in the name. -  OTC COUGH DROPS: Cough drops can help a lot, especially for mild coughs. They reduce coughing by soothing your irritated throat and removing that tickle sensation in the back of the throat. Cough drops also have the advantage of portability - you can carry them with you. * Cough syrups containing the cough suppressant dextromethorphan (DM) may help decrease your cough. Cough syrups work best for coughs that keep you awake at night. They can also sometimes help in the late stages of a respiratory infection when the cough is dry and hacking. They can be used along with cough drops. HUMIDIFIER: If the air is dry, use a humidifier in the bedroom. (Reason: dry air makes coughs worse) PREVENT DEHYDRATION: EXPECTED COURSE: Viral bronchitis causes a cough for 1 to 3 weeks. Sometimes you may cough up lots of phlegm (mucus). The mucus can normally be white, gray, yellow or green. CALL BACK IF: * You become worse. CARE ADVICE given per Cough - Acute Productive (Adult) guideline. FOR A RUNNY NOSE - BLOW YOUR NOSE: * Nasal mucus and discharge help wash viruses and bacteria out of the nose and sinuses. CALL BACK IF: * You become worse. Comments User: Fayette Pho, RN Date/Time (Eastern Time): 09/19/2015 1:39:27 PM Interactions between your selected drugs mucinex and tylenol No results found - however, this does not necessarily mean no interactions exist. Always consult with your doctor or pharmacist [email or web address] drug_[email or web address]

## 2015-09-28 ENCOUNTER — Ambulatory Visit (INDEPENDENT_AMBULATORY_CARE_PROVIDER_SITE_OTHER): Payer: Medicare Other | Admitting: *Deleted

## 2015-09-28 DIAGNOSIS — Z954 Presence of other heart-valve replacement: Secondary | ICD-10-CM | POA: Diagnosis not present

## 2015-09-28 DIAGNOSIS — Z5181 Encounter for therapeutic drug level monitoring: Secondary | ICD-10-CM | POA: Diagnosis not present

## 2015-09-28 DIAGNOSIS — Z952 Presence of prosthetic heart valve: Secondary | ICD-10-CM

## 2015-09-28 LAB — POCT INR: INR: 6

## 2015-09-28 LAB — PROTIME-INR
INR: 4.32 — AB (ref 0.00–1.49)
PROTHROMBIN TIME: 40.2 s — AB (ref 11.6–15.2)

## 2015-10-11 ENCOUNTER — Other Ambulatory Visit: Payer: Self-pay | Admitting: Internal Medicine

## 2015-10-12 ENCOUNTER — Ambulatory Visit (INDEPENDENT_AMBULATORY_CARE_PROVIDER_SITE_OTHER): Payer: Medicare Other | Admitting: *Deleted

## 2015-10-12 DIAGNOSIS — Z954 Presence of other heart-valve replacement: Secondary | ICD-10-CM | POA: Diagnosis not present

## 2015-10-12 DIAGNOSIS — Z5181 Encounter for therapeutic drug level monitoring: Secondary | ICD-10-CM | POA: Diagnosis not present

## 2015-10-12 DIAGNOSIS — Z952 Presence of prosthetic heart valve: Secondary | ICD-10-CM

## 2015-10-12 LAB — POCT INR: INR: 3

## 2015-11-04 ENCOUNTER — Ambulatory Visit (INDEPENDENT_AMBULATORY_CARE_PROVIDER_SITE_OTHER): Payer: Medicare Other

## 2015-11-04 DIAGNOSIS — Z954 Presence of other heart-valve replacement: Secondary | ICD-10-CM | POA: Diagnosis not present

## 2015-11-04 DIAGNOSIS — Z5181 Encounter for therapeutic drug level monitoring: Secondary | ICD-10-CM | POA: Diagnosis not present

## 2015-11-04 DIAGNOSIS — Z952 Presence of prosthetic heart valve: Secondary | ICD-10-CM

## 2015-11-04 LAB — POCT INR: INR: 3

## 2015-11-15 ENCOUNTER — Ambulatory Visit (INDEPENDENT_AMBULATORY_CARE_PROVIDER_SITE_OTHER): Payer: Medicare Other | Admitting: *Deleted

## 2015-11-15 DIAGNOSIS — Z950 Presence of cardiac pacemaker: Secondary | ICD-10-CM

## 2015-11-15 DIAGNOSIS — I441 Atrioventricular block, second degree: Secondary | ICD-10-CM

## 2015-11-15 NOTE — Progress Notes (Signed)
Remote pacemaker transmission.   

## 2015-11-27 ENCOUNTER — Other Ambulatory Visit: Payer: Self-pay | Admitting: Internal Medicine

## 2015-12-02 ENCOUNTER — Ambulatory Visit (INDEPENDENT_AMBULATORY_CARE_PROVIDER_SITE_OTHER): Payer: Medicare Other | Admitting: *Deleted

## 2015-12-02 DIAGNOSIS — Z954 Presence of other heart-valve replacement: Secondary | ICD-10-CM | POA: Diagnosis not present

## 2015-12-02 DIAGNOSIS — Z5181 Encounter for therapeutic drug level monitoring: Secondary | ICD-10-CM | POA: Diagnosis not present

## 2015-12-02 DIAGNOSIS — Z952 Presence of prosthetic heart valve: Secondary | ICD-10-CM

## 2015-12-02 LAB — POCT INR: INR: 3.9

## 2015-12-06 ENCOUNTER — Ambulatory Visit: Payer: Medicare Other | Admitting: Family Medicine

## 2015-12-06 ENCOUNTER — Telehealth: Payer: Self-pay

## 2015-12-06 NOTE — Telephone Encounter (Signed)
PLEASE NOTE: All timestamps contained within this report are represented as [place] Standard Time. CONFIDENTIALTY NOTICE: This fax transmission is intended only for the addressee. It contains information that is legally privileged, confidential or otherwise protected from use or disclosure. If you are not the intended recipient, you are strictly prohibited from reviewing, disclosing, copying using or disseminating any of this information or taking any action in reliance on or regarding this information. If you have received this fax in error, please notify us immediately by telephone so that we can arrange for its return to [place]. Phone: [phone], Toll-Free: [phone], Fax: [phone] Page: 1 of 2 Call Id: [id number] [facility] Night - Client TELEPHONE ADVICE RECORD [facility] Patient Name: Noah Thompson Gender: Male DOB: Nov 08, 1940 Age: 75 Y 11 M 11 D Return Phone Number: [phone] (Primary), [phone] (Secondary) Address: City/State/Zip: [place] Client [facility] Night - Client Client Site [facility] - Night Physician [name] - MD Contact Type Call Who Is Calling Patient / Member / Family / Caregiver Call Type Triage / Clinical Caller Name Echo Topp Relationship To Patient Spouse Return Phone Number [phone] (Primary) Chief Complaint Diarrhea Reason for Call Symptomatic / Request for Health Information Initial Comment Caller states that her husband has diarrhea. PreDisposition Did not know what to do Translation No Nurse Assessment Nurse: Alvera Singh, RN, Bonita Quin Date/Time (Eastern Time): 12/05/2015 3:14:25 PM Confirm and document reason for call. If symptomatic, describe symptoms. You must click the next button to save text entered. ---Caller says her husband has diarrhea. Diarrhea started Friday morning. Has had 2 stools today. Has the patient traveled out of the  country within the last 30 days? ---No Does the patient have any new or worsening symptoms? ---Yes Will a triage be completed? ---Yes Related visit to physician within the last 2 weeks? ---No Does the PT have any chronic conditions? (i.e. diabetes, asthma, etc.) ---Yes List chronic conditions. ---Pacemaker Valve replacement hypertension Is this a behavioral health or substance abuse call? ---No Guidelines Guideline Title Affirmed Question Affirmed Notes Nurse Date/Time (Eastern Time) Diarrhea [1] SEVERE diarrhea (e.g., 7 or more times / day more than normal) AND [2] present > 24 hours (1 day) Alvera Singh, RN, Bonita Quin 12/05/2015 3:19:01 PM Disp. Time Lamount Cohen Time) Disposition Final User 12/05/2015 3:25:29 PM See Physician within 24 Hours Yes Alvera Singh, RN, Bonita Quin PLEASE NOTE: All timestamps contained within this report are represented as [place] Standard Time. CONFIDENTIALTY NOTICE: This fax transmission is intended only for the addressee. It contains information that is legally privileged, confidential or otherwise protected from use or disclosure. If you are not the intended recipient, you are strictly prohibited from reviewing, disclosing, copying using or disseminating any of this information or taking any action in reliance on or regarding this information. If you have received this fax in error, please notify us immediately by telephone so that we can arrange for its return to [place]. Phone: [phone], Toll-Free: [phone], Fax: [phone] Page: 2 of 2 Call Id: [id number] Caller Understands: Yes Disagree/Comply: Comply Care Advice Given Per Guideline SEE PHYSICIAN WITHIN 24 HOURS: CLEAR FLUIDS: * Drink more fluids. * Sip water or a half-strength sports drink (Gatorade or Powerade; mix half and half with water) CALL BACK IF: * Signs of dehydration occur (e.g., no urine over 12 hours, very dry mouth, lightheaded, etc.) * Bloody stools * Constant or severe abdominal pain * You  become worse. CARE ADVICE given per Diarrhea (Adult) guideline. Referrals REFERRED  TO PCP OFFICE REFERRED TO PCP OFFICE

## 2015-12-06 NOTE — Telephone Encounter (Signed)
Pts wife scheduled appt for 12/06/15 at 10:15; when [name] told pt; pt said diarrhea is better, cancelled appt and will cb if needed.

## 2015-12-09 ENCOUNTER — Other Ambulatory Visit: Payer: Self-pay | Admitting: Internal Medicine

## 2015-12-09 LAB — CUP PACEART REMOTE DEVICE CHECK
Battery Remaining Longevity: 92 mo
Brady Statistic AP VP Percent: 1 %
Brady Statistic RA Percent Paced: 80 %
Brady Statistic RV Percent Paced: 1 %
Date Time Interrogation Session: 20170515064657
Implantable Lead Implant Date: 19930223
Implantable Lead Location: 753860
Lead Channel Impedance Value: 450 Ohm
Lead Channel Impedance Value: 480 Ohm
Lead Channel Pacing Threshold Amplitude: 1 V
Lead Channel Pacing Threshold Pulse Width: 0.5 ms
Lead Channel Sensing Intrinsic Amplitude: 12 mV
Lead Channel Setting Pacing Amplitude: 2 V
Lead Channel Setting Pacing Pulse Width: 0.5 ms
Lead Channel Setting Sensing Sensitivity: 2 mV
MDC IDC LEAD IMPLANT DT: 19930223
MDC IDC LEAD LOCATION: 753859
MDC IDC MSMT BATTERY REMAINING PERCENTAGE: 81 %
MDC IDC MSMT BATTERY VOLTAGE: 2.93 V
MDC IDC MSMT LEADCHNL RA PACING THRESHOLD AMPLITUDE: 1 V
MDC IDC MSMT LEADCHNL RA SENSING INTR AMPL: 3.5 mV
MDC IDC MSMT LEADCHNL RV PACING THRESHOLD PULSEWIDTH: 0.5 ms
MDC IDC SET LEADCHNL RV PACING AMPLITUDE: 2.5 V
MDC IDC STAT BRADY AP VS PERCENT: 82 %
MDC IDC STAT BRADY AS VP PERCENT: 1 %
MDC IDC STAT BRADY AS VS PERCENT: 16 %
Pulse Gen Model: 2210
Pulse Gen Serial Number: 7364115

## 2015-12-15 ENCOUNTER — Encounter: Payer: Self-pay | Admitting: Cardiology

## 2015-12-17 ENCOUNTER — Ambulatory Visit (INDEPENDENT_AMBULATORY_CARE_PROVIDER_SITE_OTHER): Payer: Medicare Other | Admitting: Pharmacist

## 2015-12-17 DIAGNOSIS — Z5181 Encounter for therapeutic drug level monitoring: Secondary | ICD-10-CM

## 2015-12-17 DIAGNOSIS — Z954 Presence of other heart-valve replacement: Secondary | ICD-10-CM

## 2015-12-17 DIAGNOSIS — Z952 Presence of prosthetic heart valve: Secondary | ICD-10-CM

## 2015-12-17 LAB — POCT INR: INR: 4.3

## 2015-12-30 ENCOUNTER — Ambulatory Visit (INDEPENDENT_AMBULATORY_CARE_PROVIDER_SITE_OTHER): Payer: Medicare Other | Admitting: *Deleted

## 2015-12-30 DIAGNOSIS — Z954 Presence of other heart-valve replacement: Secondary | ICD-10-CM | POA: Diagnosis not present

## 2015-12-30 DIAGNOSIS — Z952 Presence of prosthetic heart valve: Secondary | ICD-10-CM

## 2015-12-30 DIAGNOSIS — Z5181 Encounter for therapeutic drug level monitoring: Secondary | ICD-10-CM

## 2015-12-30 LAB — POCT INR: INR: 1.4

## 2016-01-06 ENCOUNTER — Ambulatory Visit (INDEPENDENT_AMBULATORY_CARE_PROVIDER_SITE_OTHER): Payer: Medicare Other | Admitting: Pharmacist

## 2016-01-06 DIAGNOSIS — Z954 Presence of other heart-valve replacement: Secondary | ICD-10-CM | POA: Diagnosis not present

## 2016-01-06 DIAGNOSIS — Z5181 Encounter for therapeutic drug level monitoring: Secondary | ICD-10-CM | POA: Diagnosis not present

## 2016-01-06 DIAGNOSIS — Z952 Presence of prosthetic heart valve: Secondary | ICD-10-CM

## 2016-01-06 LAB — POCT INR: INR: 2.6

## 2016-01-10 ENCOUNTER — Telehealth: Payer: Self-pay | Admitting: Internal Medicine

## 2016-01-10 NOTE — Telephone Encounter (Signed)
[facility]  Called by the pt who was concerned as he accidentally took an additional Losartan 100 mg tab this evening, having already taken it today at noon.  I advised that he monitor his BP over the next couple of hours & hold tomorrow's dose.  He understood.  He will take his regularly scheduled Metoprolol XL tomorrow so long as his BP is sufficient.  His current home BP measurements are hypertensive.     Lance Morin, MD

## 2016-01-28 ENCOUNTER — Ambulatory Visit (INDEPENDENT_AMBULATORY_CARE_PROVIDER_SITE_OTHER): Payer: Medicare Other | Admitting: Pharmacist

## 2016-01-28 DIAGNOSIS — Z5181 Encounter for therapeutic drug level monitoring: Secondary | ICD-10-CM

## 2016-01-28 DIAGNOSIS — Z952 Presence of prosthetic heart valve: Secondary | ICD-10-CM

## 2016-01-28 DIAGNOSIS — Z954 Presence of other heart-valve replacement: Secondary | ICD-10-CM

## 2016-01-28 LAB — POCT INR: INR: 2.4

## 2016-02-01 ENCOUNTER — Encounter: Payer: Medicare Other | Admitting: Family Medicine

## 2016-02-16 ENCOUNTER — Encounter: Payer: Self-pay | Admitting: Family Medicine

## 2016-02-16 ENCOUNTER — Ambulatory Visit (INDEPENDENT_AMBULATORY_CARE_PROVIDER_SITE_OTHER): Payer: Medicare Other | Admitting: Family Medicine

## 2016-02-16 VITALS — BP 140/80 | HR 64 | Temp 98.2°F | Ht 69.75 in | Wt 171.8 lb

## 2016-02-16 DIAGNOSIS — Z7901 Long term (current) use of anticoagulants: Secondary | ICD-10-CM | POA: Diagnosis not present

## 2016-02-16 DIAGNOSIS — I10 Essential (primary) hypertension: Secondary | ICD-10-CM

## 2016-02-16 DIAGNOSIS — Z7189 Other specified counseling: Secondary | ICD-10-CM

## 2016-02-16 DIAGNOSIS — Z952 Presence of prosthetic heart valve: Secondary | ICD-10-CM

## 2016-02-16 DIAGNOSIS — I441 Atrioventricular block, second degree: Secondary | ICD-10-CM

## 2016-02-16 DIAGNOSIS — I5030 Unspecified diastolic (congestive) heart failure: Secondary | ICD-10-CM

## 2016-02-16 DIAGNOSIS — Z23 Encounter for immunization: Secondary | ICD-10-CM | POA: Diagnosis not present

## 2016-02-16 DIAGNOSIS — E785 Hyperlipidemia, unspecified: Secondary | ICD-10-CM

## 2016-02-16 DIAGNOSIS — Z950 Presence of cardiac pacemaker: Secondary | ICD-10-CM

## 2016-02-16 DIAGNOSIS — I428 Other cardiomyopathies: Secondary | ICD-10-CM

## 2016-02-16 DIAGNOSIS — Z0000 Encounter for general adult medical examination without abnormal findings: Secondary | ICD-10-CM

## 2016-02-16 LAB — LIPID PANEL
CHOL/HDL RATIO: 5
CHOLESTEROL: 252 mg/dL — AB (ref 0–200)
HDL: 53.7 mg/dL (ref >39.00)
NonHDL: 197.95
TRIGLYCERIDES: 206 mg/dL — AB (ref 0.0–149.0)
VLDL: 41.2 mg/dL — AB (ref 0.0–40.0)

## 2016-02-16 LAB — CBC WITH DIFFERENTIAL/PLATELET
BASOS ABS: 0 10*3/uL (ref 0.0–0.1)
Basophils Relative: 0.6 % (ref 0.0–3.0)
EOS ABS: 0.6 10*3/uL (ref 0.0–0.7)
EOS PCT: 7 % — AB (ref 0.0–5.0)
HCT: 42.2 % (ref 39.0–52.0)
HEMOGLOBIN: 14.2 g/dL (ref 13.0–17.0)
LYMPHS ABS: 2.3 10*3/uL (ref 0.7–4.0)
Lymphocytes Relative: 26.4 % (ref 12.0–46.0)
MCHC: 33.5 g/dL (ref 30.0–36.0)
MCV: 85.2 fl (ref 78.0–100.0)
MONO ABS: 0.7 10*3/uL (ref 0.1–1.0)
Monocytes Relative: 7.9 % (ref 3.0–12.0)
NEUTROS PCT: 58.1 % (ref 43.0–77.0)
Neutro Abs: 5.1 10*3/uL (ref 1.4–7.7)
Platelets: 278 10*3/uL (ref 150.0–400.0)
RBC: 4.96 Mil/uL (ref 4.22–5.81)
RDW: 15.1 % (ref 11.5–15.5)
WBC: 8.7 10*3/uL (ref 4.0–10.5)

## 2016-02-16 LAB — COMPREHENSIVE METABOLIC PANEL
ALK PHOS: 74 U/L (ref 39–117)
ALT: 15 U/L (ref 0–53)
AST: 10 U/L (ref 0–37)
Albumin: 4.6 g/dL (ref 3.5–5.2)
BILIRUBIN TOTAL: 0.5 mg/dL (ref 0.2–1.2)
BUN: 22 mg/dL (ref 6–23)
CO2: 31 mEq/L (ref 19–32)
CREATININE: 1.42 mg/dL (ref 0.40–1.50)
Calcium: 10 mg/dL (ref 8.4–10.5)
Chloride: 105 mEq/L (ref 96–112)
GFR: 51.64 mL/min — ABNORMAL LOW (ref >60.00)
GLUCOSE: 95 mg/dL (ref 70–99)
Potassium: 4.8 mEq/L (ref 3.5–5.1)
SODIUM: 141 meq/L (ref 135–145)
TOTAL PROTEIN: 7.2 g/dL (ref 6.0–8.3)

## 2016-02-16 LAB — TSH: TSH: 1.21 u[IU]/mL (ref 0.35–4.50)

## 2016-02-16 LAB — LDL CHOLESTEROL, DIRECT: LDL DIRECT: 157 mg/dL

## 2016-02-16 NOTE — Assessment & Plan Note (Signed)

## 2016-02-16 NOTE — Assessment & Plan Note (Deleted)
On coumadin 

## 2016-02-16 NOTE — Assessment & Plan Note (Addendum)
Stable period on low sodium diet.

## 2016-02-16 NOTE — Patient Instructions (Addendum)
We will sign you up for cologuard.  Call your insurance about the shingles or tetanus shot to see if it is covered or how much it would cost and where is cheaper (here or pharmacy).  If you want to receive here, call for nurse visit.  Prevnar today.  Lab work today.  Advanced directive packet provided today.  Return as needed or in 1 year for next medicare wellness visit.   Health Maintenance, Male A healthy lifestyle and preventative care can promote health and wellness.  Maintain regular health, dental, and eye exams.  Eat a healthy diet. Foods like vegetables, fruits, whole grains, low-fat dairy products, and lean protein foods contain the nutrients you need and are low in calories. Decrease your intake of foods high in solid fats, added sugars, and salt. Get information about a proper diet from your health care provider, if necessary.  Regular physical exercise is one of the most important things you can do for your health. Most adults should get at least 150 minutes of moderate-intensity exercise (any activity that increases your heart rate and causes you to sweat) each week. In addition, most adults need muscle-strengthening exercises on 2 or more days a week.   Maintain a healthy weight. The body mass index (BMI) is a screening tool to identify possible weight problems. It provides an estimate of body fat based on height and weight. Your health care provider can find your BMI and can help you achieve or maintain a healthy weight. For males 20 years and older:  A BMI below 18.5 is considered underweight.  A BMI of 18.5 to 24.9 is normal.  A BMI of 25 to 29.9 is considered overweight.  A BMI of 30 and above is considered obese.  Maintain normal blood lipids and cholesterol by exercising and minimizing your intake of saturated fat. Eat a balanced diet with plenty of fruits and vegetables. Blood tests for lipids and cholesterol should begin at age 56 and be repeated every 5 years. If  your lipid or cholesterol levels are high, you are over age 67, or you are at high risk for heart disease, you may need your cholesterol levels checked more frequently.Ongoing high lipid and cholesterol levels should be treated with medicines if diet and exercise are not working.  If you smoke, find out from your health care provider how to quit. If you do not use tobacco, do not start.  Lung cancer screening is recommended for adults aged 55-80 years who are at high risk for developing lung cancer because of a history of smoking. A yearly low-dose CT scan of the lungs is recommended for people who have at least a 30-pack-year history of smoking and are current smokers or have quit within the past 15 years. A pack year of smoking is smoking an average of 1 pack of cigarettes a day for 1 year (for example, a 30-pack-year history of smoking could mean smoking 1 pack a day for 30 years or 2 packs a day for 15 years). Yearly screening should continue until the smoker has stopped smoking for at least 15 years. Yearly screening should be stopped for people who develop a health problem that would prevent them from having lung cancer treatment.  If you choose to drink alcohol, do not have more than 2 drinks per day. One drink is considered to be 12 oz (360 mL) of beer, 5 oz (150 mL) of wine, or 1.5 oz (45 mL) of liquor.  Avoid the use of  street drugs. Do not share needles with anyone. Ask for help if you need support or instructions about stopping the use of drugs.  High blood pressure causes heart disease and increases the risk of stroke. High blood pressure is more likely to develop in:  People who have blood pressure in the end of the normal range (100-139/85-89 mm Hg).  People who are overweight or obese.  People who are African American.  If you are 32-44 years of age, have your blood pressure checked every 3-5 years. If you are 83 years of age or older, have your blood pressure checked every year.  You should have your blood pressure measured twice--once when you are at a hospital or clinic, and once when you are not at a hospital or clinic. Record the average of the two measurements. To check your blood pressure when you are not at a hospital or clinic, you can use:  An automated blood pressure machine at a pharmacy.  A home blood pressure monitor.  If you are 24-75 years old, ask your health care provider if you should take aspirin to prevent heart disease.  Diabetes screening involves taking a blood sample to check your fasting blood sugar level. This should be done once every 3 years after age 18 if you are at a normal weight and without risk factors for diabetes. Testing should be considered at a younger age or be carried out more frequently if you are overweight and have at least 1 risk factor for diabetes.  Colorectal cancer can be detected and often prevented. Most routine colorectal cancer screening begins at the age of 20 and continues through age 74. However, your health care provider may recommend screening at an earlier age if you have risk factors for colon cancer. On a yearly basis, your health care provider may provide home test kits to check for hidden blood in the stool. A small camera at the end of a tube may be used to directly examine the colon (sigmoidoscopy or colonoscopy) to detect the earliest forms of colorectal cancer. Talk to your health care provider about this at age 27 when routine screening begins. A direct exam of the colon should be repeated every 5-10 years through age 22, unless early forms of precancerous polyps or small growths are found.  People who are at an increased risk for hepatitis B should be screened for this virus. You are considered at high risk for hepatitis B if:  You were born in a country where hepatitis B occurs often. Talk with your health care provider about which countries are considered high risk.  Your parents were born in a high-risk  country and you have not received a shot to protect against hepatitis B (hepatitis B vaccine).  You have HIV or AIDS.  You use needles to inject street drugs.  You live with, or have sex with, someone who has hepatitis B.  You are a man who has sex with other men (MSM).  You get hemodialysis treatment.  You take certain medicines for conditions like cancer, organ transplantation, and autoimmune conditions.  Hepatitis C blood testing is recommended for all people born from [birth year] through 1965 and any individual with known risk factors for hepatitis C.  Healthy men should no longer receive prostate-specific antigen (PSA) blood tests as part of routine cancer screening. Talk to your health care provider about prostate cancer screening.  Testicular cancer screening is not recommended for adolescents or adult males who have no symptoms. Screening  includes self-exam, a health care provider exam, and other screening tests. Consult with your health care provider about any symptoms you have or any concerns you have about testicular cancer.  Practice safe sex. Use condoms and avoid high-risk sexual practices to reduce the spread of sexually transmitted infections (STIs).  You should be screened for STIs, including gonorrhea and chlamydia if:  You are sexually active and are younger than 24 years.  You are older than 24 years, and your health care provider tells you that you are at risk for this type of infection.  Your sexual activity has changed since you were last screened, and you are at an increased risk for chlamydia or gonorrhea. Ask your health care provider if you are at risk.  If you are at risk of being infected with HIV, it is recommended that you take a prescription medicine daily to prevent HIV infection. This is called pre-exposure prophylaxis (PrEP). You are considered at risk if:  You are a man who has sex with other men (MSM).  You are a heterosexual man who is sexually active  with multiple partners.  You take drugs by injection.  You are sexually active with a partner who has HIV.  Talk with your health care provider about whether you are at high risk of being infected with HIV. If you choose to begin PrEP, you should first be tested for HIV. You should then be tested every 3 months for as long as you are taking PrEP.  Use sunscreen. Apply sunscreen liberally and repeatedly throughout the day. You should seek shade when your shadow is shorter than you. Protect yourself by wearing long sleeves, pants, a wide-brimmed hat, and sunglasses year round whenever you are outdoors.  Tell your health care provider of new moles or changes in moles, especially if there is a change in shape or color. Also, tell your health care provider if a mole is larger than the size of a pencil eraser.  A one-time screening for abdominal aortic aneurysm (AAA) and surgical repair of large AAAs by ultrasound is recommended for men aged 65-75 years who are current or former smokers.  Stay current with your vaccines (immunizations).   This information is not intended to replace advice given to you by your health care provider. Make sure you discuss any questions you have with your health care provider.   Document Released: 12/16/2007 Document Revised: 07/10/2014 Document Reviewed: 11/14/2010 Elsevier Interactive Patient Education [employer]2016 Elsevier Inc.

## 2016-02-16 NOTE — Assessment & Plan Note (Signed)
On coumadin 

## 2016-02-16 NOTE — Progress Notes (Signed)
Pre visit review using our clinic review tool, if applicable. No additional management support is needed unless otherwise documented below in the visit note. 

## 2016-02-16 NOTE — Assessment & Plan Note (Signed)
S/p PPM placed 1993

## 2016-02-16 NOTE — Progress Notes (Signed)
BP 140/80   Pulse 64   Temp 98.2 F (36.8 C) (Oral)   Ht 5' 9.75" (1.772 m)   Wt 171 lb 12 oz (77.9 kg)   BMI 24.82 kg/m    CC: medicare wellness visit Subjective:    Patient ID: Noah Thompson, male    DOB: October 21, 1940, 75 y.o.   MRN: [id number]  HPI: Noah Thompson is a 75 y.o. male presenting on 02/16/2016 for No chief complaint on file.   Last seen here 12/2013. He sees Dr Johney Frame regularly Q40mo, keeping close track of sodium intake, monitors weight twice daily. EF 25% --> 45%. 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   Hearing screen - passed  Vision screen - failed on left - pt attributes to bleed from coumadin. Regularly sees eye doctor.  Fall risk screen - passed  Depression screen - passed   Preventative: Colon cancer screening - declines colonoscopy. Would like cologuard but unsure if he would proceed with colonoscopy. Prostate cancer screening - unsure if he received this at [facility]. Previously normal at [place].  Lung cancer screening - never smoker Flu shot - yearly  Tetanus shot - ?declines Pneumonia shot - at [place] likely pneumovax. prevnar today. Shingles shot - declines.  Advanced directive discussion - has not set up. Would want wife to be HCPOA. Packet provided today.  Seat belt use discussed Sunscreen use and skin screen discussed.   Lives with wife, 5 dogs. grown children away. Occupation: retired, was Teaching laboratory technician Activity: welding  Diet: low sodium diet, good water, fruits/vegetables daily  Relevant past medical, surgical, family and social history reviewed and updated as indicated. Interim medical history since our last visit reviewed. Allergies and medications reviewed and updated. Current Outpatient Prescriptions on File Prior to Visit  Medication Sig  . alum & mag hydroxide-simeth (MAALOX/MYLANTA) 200-200-20 MG/5ML suspension Take 15 mLs by mouth every 6 (six) hours as needed for indigestion or  heartburn.  . furosemide (LASIX) 20 MG tablet TAKE ONE TABLET BY MOUTH ONCE DAILY  . losartan (COZAAR) 100 MG tablet TAKE ONE TABLET BY MOUTH ONCE DAILY  . metoprolol succinate (TOPROL-XL) 50 MG 24 hr tablet TAKE ONE TABLET BY MOUTH ONCE DAILY TAKE  WITH  OR  IMMEDIATELY  FOLLOWING  A  MEAL  . ranitidine (ZANTAC) 150 MG capsule Take 150 mg by mouth daily as needed for heartburn.   . spironolactone (ALDACTONE) 25 MG tablet TAKE ONE TABLET BY MOUTH ONCE DAILY  . warfarin (COUMADIN) 5 MG tablet TAKE AS DIRECTED BY  COUMADIN  CLINIC   No current facility-administered medications on file prior to visit.     Review of Systems  Constitutional: Negative for activity change, appetite change, chills, fatigue, fever and unexpected weight change.  HENT: Negative for hearing loss.   Eyes: Negative for visual disturbance.  Respiratory: Negative for cough, chest tightness, shortness of breath and wheezing.   Cardiovascular: Negative for chest pain, palpitations and leg swelling.  Gastrointestinal: Negative for abdominal distention, abdominal pain, blood in stool, constipation, diarrhea, nausea and vomiting.  Genitourinary: Negative for difficulty urinating and hematuria.  Musculoskeletal: Negative for arthralgias, myalgias and neck pain.  Skin: Negative for rash.  Neurological: Negative for dizziness, seizures, syncope and headaches.  Hematological: Negative for adenopathy. Does not bruise/bleed easily.  Psychiatric/Behavioral: Negative for dysphoric mood. The patient is not nervous/anxious.    Per HPI unless specifically indicated in ROS section  Objective:    BP 140/80   Pulse 64   Temp 98.2 F (36.8 C) (Oral)   Ht 5' 9.75" (1.772 m)   Wt 171 lb 12 oz (77.9 kg)   BMI 24.82 kg/m   Wt Readings from Last 3 Encounters:  02/16/16 171 lb 12 oz (77.9 kg)  08/16/15 179 lb 12.8 oz (81.6 kg)  01/27/15 169 lb 3.2 oz (76.7 kg)    Physical Exam  Constitutional: He is oriented to person, place,  and time. He appears well-developed and well-nourished. No distress.  HENT:  Head: Normocephalic and atraumatic.  Right Ear: Hearing, tympanic membrane, external ear and ear canal normal.  Left Ear: Hearing, tympanic membrane, external ear and ear canal normal.  Nose: Nose normal.  Mouth/Throat: Uvula is midline, oropharynx is clear and moist and mucous membranes are normal. No oropharyngeal exudate, posterior oropharyngeal edema or posterior oropharyngeal erythema.  Eyes: Conjunctivae and EOM are normal. Pupils are equal, round, and reactive to light. No scleral icterus.  Neck: Normal range of motion. Neck supple. Carotid bruit is not present. No thyromegaly present.  Cardiovascular: Normal rate, regular rhythm and intact distal pulses.   Murmur (mechanical valve) heard. Pulses:      Radial pulses are 2+ on the right side, and 2+ on the left side.  Pulmonary/Chest: Effort normal and breath sounds normal. No respiratory distress. He has no wheezes. He has no rales.  Abdominal: Soft. Bowel sounds are normal. He exhibits no distension and no mass. There is no tenderness. There is no rebound and no guarding.  Musculoskeletal: Normal range of motion. He exhibits no edema.  Lymphadenopathy:    He has no cervical adenopathy.  Neurological: He is alert and oriented to person, place, and time.  CN grossly intact, station and gait intact Recall 3/3 Calculation 4/5 serial 3s  Skin: Skin is warm and dry. No rash noted.  Psychiatric: He has a normal mood and affect. His behavior is normal. Judgment and thought content normal.  Nursing note and vitals reviewed.  Results for orders placed or performed in visit on 01/28/16  POCT INR  Result Value Ref Range   INR 2.4       Assessment & Plan:   Problem List Items Addressed This Visit    (HFpEF) heart failure with preserved ejection fraction (HCC)    Stable period on low sodium diet.       Advanced care planning/counseling discussion     Advanced directive discussion - has not set up. Would want wife to be HCPOA. Packet provided today.       Essential hypertension, benign    Chronic, stable. Continue current regimen.       Relevant Orders   Comprehensive metabolic panel   TSH   Health maintenance examination    Preventative protocols reviewed and updated unless pt declined. Discussed healthy diet and lifestyle.       HLD (hyperlipidemia)    Off fish oil or statin. Overdue for FLP - check today.       Relevant Orders   Lipid panel   Comprehensive metabolic panel   Long term current use of anticoagulant therapy   Relevant Orders   CBC with Differential/Platelet   Medicare annual wellness visit, subsequent - Primary    I have personally reviewed the Medicare Annual Wellness questionnaire and have noted 1. The patient's medical and social history 2. Their use of alcohol, tobacco or illicit drugs 3. Their current medications and supplements 4. The patient's functional  ability including ADL's, fall risks, home safety risks and hearing or visual impairment. Cognitive function has been assessed and addressed as indicated.  5. Diet and physical activity 6. Evidence for depression or mood disorders The patients weight, height, BMI have been recorded in the chart. I have made referrals, counseling and provided education to the patient based on review of the above and I have provided the pt with a written personalized care plan for preventive services. Provider list updated.. See scanned questionairre as needed for further documentation. Reviewed preventative protocols and updated unless pt declined.       Nonischemic cardiomyopathy (HCC)    Seems euvolemic today.  Latest echo 09/2015 stable.       PACEMAKER-[employer]   S/P aortic valve replacement    On coumadin.       Second degree AV block    S/p PPM placed 1993       Other Visit Diagnoses   None.      Follow up plan: Return in about 1 year (around  02/15/2017), or as needed, for medicare wellness visit.  Eustaquio BoydenJavier Margretta Zamorano, MD

## 2016-02-16 NOTE — Assessment & Plan Note (Signed)
Preventative protocols reviewed and updated unless pt declined. Discussed healthy diet and lifestyle.  

## 2016-02-16 NOTE — Assessment & Plan Note (Signed)
Chronic, stable. Continue current regimen. 

## 2016-02-16 NOTE — Assessment & Plan Note (Signed)
Advanced directive discussion - has not set up. Would want wife to be HCPOA. Packet provided today.

## 2016-02-16 NOTE — Assessment & Plan Note (Signed)
Off fish oil or statin. Overdue for FLP - check today.

## 2016-02-16 NOTE — Addendum Note (Signed)
Addended by: Josph Macho A on: 02/16/2016 12:57 PM   Modules accepted: Orders

## 2016-02-16 NOTE — Assessment & Plan Note (Addendum)
Seems euvolemic today.  Latest echo 09/2015 stable.

## 2016-02-18 ENCOUNTER — Ambulatory Visit (INDEPENDENT_AMBULATORY_CARE_PROVIDER_SITE_OTHER): Payer: Medicare Other | Admitting: Pharmacist

## 2016-02-18 DIAGNOSIS — Z954 Presence of other heart-valve replacement: Secondary | ICD-10-CM

## 2016-02-18 DIAGNOSIS — Z952 Presence of prosthetic heart valve: Secondary | ICD-10-CM

## 2016-02-18 DIAGNOSIS — Z5181 Encounter for therapeutic drug level monitoring: Secondary | ICD-10-CM | POA: Diagnosis not present

## 2016-02-18 LAB — POCT INR: INR: 2.5

## 2016-02-21 ENCOUNTER — Encounter: Payer: Self-pay | Admitting: *Deleted

## 2016-02-23 ENCOUNTER — Encounter: Payer: Self-pay | Admitting: Family Medicine

## 2016-02-23 LAB — COLOGUARD

## 2016-03-17 ENCOUNTER — Ambulatory Visit (INDEPENDENT_AMBULATORY_CARE_PROVIDER_SITE_OTHER): Payer: Medicare Other | Admitting: *Deleted

## 2016-03-17 DIAGNOSIS — Z954 Presence of other heart-valve replacement: Secondary | ICD-10-CM | POA: Diagnosis not present

## 2016-03-17 DIAGNOSIS — Z952 Presence of prosthetic heart valve: Secondary | ICD-10-CM

## 2016-03-17 DIAGNOSIS — Z5181 Encounter for therapeutic drug level monitoring: Secondary | ICD-10-CM

## 2016-03-17 LAB — POCT INR: INR: 2.2

## 2016-04-06 ENCOUNTER — Ambulatory Visit (INDEPENDENT_AMBULATORY_CARE_PROVIDER_SITE_OTHER): Payer: Medicare Other | Admitting: Pharmacist

## 2016-04-06 DIAGNOSIS — Z952 Presence of prosthetic heart valve: Secondary | ICD-10-CM

## 2016-04-06 DIAGNOSIS — Z5181 Encounter for therapeutic drug level monitoring: Secondary | ICD-10-CM | POA: Diagnosis not present

## 2016-04-06 LAB — POCT INR: INR: 2.6

## 2016-05-05 ENCOUNTER — Ambulatory Visit (INDEPENDENT_AMBULATORY_CARE_PROVIDER_SITE_OTHER): Payer: Medicare Other | Admitting: Pharmacist

## 2016-05-05 DIAGNOSIS — Z952 Presence of prosthetic heart valve: Secondary | ICD-10-CM | POA: Diagnosis not present

## 2016-05-05 DIAGNOSIS — Z5181 Encounter for therapeutic drug level monitoring: Secondary | ICD-10-CM

## 2016-05-05 LAB — POCT INR: INR: 2

## 2016-05-11 ENCOUNTER — Other Ambulatory Visit: Payer: Self-pay | Admitting: Internal Medicine

## 2016-05-29 DIAGNOSIS — Z1211 Encounter for screening for malignant neoplasm of colon: Secondary | ICD-10-CM | POA: Diagnosis not present

## 2016-05-29 DIAGNOSIS — Z1212 Encounter for screening for malignant neoplasm of rectum: Secondary | ICD-10-CM | POA: Diagnosis not present

## 2016-05-31 LAB — COLOGUARD: Cologuard: NEGATIVE

## 2016-06-06 ENCOUNTER — Ambulatory Visit (INDEPENDENT_AMBULATORY_CARE_PROVIDER_SITE_OTHER): Payer: Medicare Other | Admitting: *Deleted

## 2016-06-06 DIAGNOSIS — Z23 Encounter for immunization: Secondary | ICD-10-CM

## 2016-06-06 DIAGNOSIS — Z5181 Encounter for therapeutic drug level monitoring: Secondary | ICD-10-CM

## 2016-06-06 DIAGNOSIS — Z952 Presence of prosthetic heart valve: Secondary | ICD-10-CM | POA: Diagnosis not present

## 2016-06-06 LAB — POCT INR: INR: 2.3

## 2016-06-21 ENCOUNTER — Ambulatory Visit (INDEPENDENT_AMBULATORY_CARE_PROVIDER_SITE_OTHER): Payer: Medicare Other | Admitting: *Deleted

## 2016-06-21 DIAGNOSIS — Z952 Presence of prosthetic heart valve: Secondary | ICD-10-CM | POA: Diagnosis not present

## 2016-06-21 DIAGNOSIS — Z5181 Encounter for therapeutic drug level monitoring: Secondary | ICD-10-CM | POA: Diagnosis not present

## 2016-06-21 LAB — POCT INR: INR: 2.4

## 2016-06-28 ENCOUNTER — Encounter: Payer: Self-pay | Admitting: *Deleted

## 2016-07-10 ENCOUNTER — Other Ambulatory Visit: Payer: Self-pay | Admitting: Internal Medicine

## 2016-07-28 ENCOUNTER — Ambulatory Visit (INDEPENDENT_AMBULATORY_CARE_PROVIDER_SITE_OTHER): Payer: Medicare Other | Admitting: *Deleted

## 2016-07-28 DIAGNOSIS — Z5181 Encounter for therapeutic drug level monitoring: Secondary | ICD-10-CM | POA: Diagnosis not present

## 2016-07-28 DIAGNOSIS — Z952 Presence of prosthetic heart valve: Secondary | ICD-10-CM | POA: Diagnosis not present

## 2016-07-28 LAB — POCT INR: INR: 4

## 2016-08-18 ENCOUNTER — Ambulatory Visit (INDEPENDENT_AMBULATORY_CARE_PROVIDER_SITE_OTHER): Payer: Medicare Other | Admitting: Pharmacist

## 2016-08-18 DIAGNOSIS — Z952 Presence of prosthetic heart valve: Secondary | ICD-10-CM | POA: Diagnosis not present

## 2016-08-18 DIAGNOSIS — Z5181 Encounter for therapeutic drug level monitoring: Secondary | ICD-10-CM

## 2016-08-18 LAB — POCT INR: INR: 2.8

## 2016-09-06 ENCOUNTER — Other Ambulatory Visit: Payer: Self-pay | Admitting: Internal Medicine

## 2016-09-14 ENCOUNTER — Ambulatory Visit (INDEPENDENT_AMBULATORY_CARE_PROVIDER_SITE_OTHER): Payer: Medicare Other | Admitting: *Deleted

## 2016-09-14 DIAGNOSIS — Z5181 Encounter for therapeutic drug level monitoring: Secondary | ICD-10-CM | POA: Diagnosis not present

## 2016-09-14 DIAGNOSIS — Z952 Presence of prosthetic heart valve: Secondary | ICD-10-CM | POA: Diagnosis not present

## 2016-09-14 LAB — POCT INR: INR: 3.1

## 2016-10-06 ENCOUNTER — Other Ambulatory Visit: Payer: Self-pay | Admitting: Internal Medicine

## 2016-10-09 ENCOUNTER — Other Ambulatory Visit: Payer: Self-pay | Admitting: *Deleted

## 2016-10-09 MED ORDER — LOSARTAN POTASSIUM 100 MG PO TABS: 100.0000 mg | ORAL_TABLET | Freq: Every day | ORAL | 0 refills | Status: DC

## 2016-10-09 NOTE — Telephone Encounter (Signed)
Patients wife [name] called and stated that the pharmacy requested a refill on losartan for the patient last week but they have not received a response. The rx did not reach the pharmacy as it was on class print. I have made her aware that the patient needs to is overdue and needs to schedule a one year follow up appointment. She was willing to schedule so I transferred her to scheduling but first informed her that I would resend the rx and be sure that the patient had adequate medication to last until appointment.

## 2016-10-12 ENCOUNTER — Ambulatory Visit (INDEPENDENT_AMBULATORY_CARE_PROVIDER_SITE_OTHER): Payer: Medicare Other | Admitting: *Deleted

## 2016-10-12 DIAGNOSIS — Z5181 Encounter for therapeutic drug level monitoring: Secondary | ICD-10-CM

## 2016-10-12 DIAGNOSIS — Z952 Presence of prosthetic heart valve: Secondary | ICD-10-CM | POA: Diagnosis not present

## 2016-10-12 LAB — POCT INR: INR: 4.7

## 2016-10-24 ENCOUNTER — Ambulatory Visit: Payer: Medicare Other | Admitting: Internal Medicine

## 2016-10-30 ENCOUNTER — Encounter: Payer: Self-pay | Admitting: Internal Medicine

## 2016-10-30 ENCOUNTER — Ambulatory Visit (INDEPENDENT_AMBULATORY_CARE_PROVIDER_SITE_OTHER): Payer: Medicare Other | Admitting: *Deleted

## 2016-10-30 ENCOUNTER — Ambulatory Visit (INDEPENDENT_AMBULATORY_CARE_PROVIDER_SITE_OTHER): Payer: Medicare Other | Admitting: Internal Medicine

## 2016-10-30 VITALS — BP 128/80 | HR 81 | Ht 71.0 in | Wt 179.0 lb

## 2016-10-30 DIAGNOSIS — I441 Atrioventricular block, second degree: Secondary | ICD-10-CM | POA: Diagnosis not present

## 2016-10-30 DIAGNOSIS — Z952 Presence of prosthetic heart valve: Secondary | ICD-10-CM | POA: Diagnosis not present

## 2016-10-30 DIAGNOSIS — I428 Other cardiomyopathies: Secondary | ICD-10-CM | POA: Diagnosis not present

## 2016-10-30 DIAGNOSIS — Z5181 Encounter for therapeutic drug level monitoring: Secondary | ICD-10-CM

## 2016-10-30 DIAGNOSIS — I10 Essential (primary) hypertension: Secondary | ICD-10-CM | POA: Diagnosis not present

## 2016-10-30 DIAGNOSIS — Z950 Presence of cardiac pacemaker: Secondary | ICD-10-CM

## 2016-10-30 LAB — CUP PACEART INCLINIC DEVICE CHECK
Battery Voltage: 2.92 V
Brady Statistic RA Percent Paced: 85 %
Date Time Interrogation Session: 20180430171232
Implantable Lead Implant Date: 19930223
Implantable Lead Implant Date: 19930223
Implantable Lead Location: 753860
Lead Channel Impedance Value: 450 Ohm
Lead Channel Pacing Threshold Amplitude: 1.25 V
Lead Channel Pacing Threshold Pulse Width: 0.5 ms
Lead Channel Sensing Intrinsic Amplitude: 11.9 mV
Lead Channel Setting Pacing Amplitude: 2.5 V
MDC IDC LEAD LOCATION: 753859
MDC IDC MSMT LEADCHNL RA SENSING INTR AMPL: 3.7 mV
MDC IDC MSMT LEADCHNL RV IMPEDANCE VALUE: 525 Ohm
MDC IDC MSMT LEADCHNL RV PACING THRESHOLD AMPLITUDE: 1 V
MDC IDC MSMT LEADCHNL RV PACING THRESHOLD PULSEWIDTH: 0.5 ms
MDC IDC PG IMPLANT DT: 20130725
MDC IDC SET LEADCHNL RA PACING AMPLITUDE: 2.5 V
MDC IDC SET LEADCHNL RV PACING PULSEWIDTH: 0.5 ms
MDC IDC SET LEADCHNL RV SENSING SENSITIVITY: 2 mV
MDC IDC STAT BRADY RV PERCENT PACED: 0.68 %
Pulse Gen Serial Number: 7364115

## 2016-10-30 LAB — POCT INR: INR: 4.2

## 2016-10-30 MED ORDER — METOPROLOL SUCCINATE ER 50 MG PO TB24: 50.0000 mg | ORAL_TABLET | Freq: Every day | ORAL | 3 refills | Status: DC

## 2016-10-30 MED ORDER — LOSARTAN POTASSIUM 100 MG PO TABS: 100.0000 mg | ORAL_TABLET | Freq: Every day | ORAL | 3 refills | Status: DC

## 2016-10-30 NOTE — Patient Instructions (Addendum)
Medication Instructions:  Your physician recommends that you continue on your current medications as directed. Please refer to the Current Medication list given to you today.   Labwork: None ordered   Testing/Procedures: Your physician has requested that you have an echocardiogram. Echocardiography is a painless test that uses sound waves to create images of your heart. It provides your doctor with information about the size and shape of your heart and how well your heart's chambers and valves are working. This procedure takes approximately one hour. There are no restrictions for this procedure.    Follow-Up: Remote monitoring is used to monitor your Pacemaker  from home. This monitoring reduces the number of office visits required to check your device to one time per year. It allows [place] to keep an eye on the functioning of your device to ensure it is working properly. You are scheduled for a device check from home on 01/29/17. You may send your transmission at any time that day. If you have a wireless device, the transmission will be sent automatically. After your physician reviews your transmission, you will receive a postcard with your next transmission date.   Your physician wants you to follow-up in: 12 months with Dr Jacquiline Doe will receive a reminder letter in the mail two months in advance. If you don't receive a letter, please call our office to schedule the follow-up appointment.     Any Other Special Instructions Will Be Listed Below (If Applicable).     If you need a refill on your cardiac medications before your next appointment, please call your pharmacy.

## 2016-10-30 NOTE — Progress Notes (Signed)
Electrophysiology Office Note   Date:  10/30/2016   ID:  Noah Thompson, DOB [birth year], MRN [id number]  PCP:  [name], MD   Primary Electrophysiologist: [name], MD    Chief Complaint  Patient presents with  . Ventricular tachycardia (HCC)     History of Present Illness: Noah Thompson is a 76 y.o. male who presents today for electrophysiology evaluation.  He is doing great.  He is active.  Does not presently have cardiac limitation. Today, he denies symptoms of palpitations, chest pain, shortness of breath, orthopnea, PND, lower extremity edema, claudication, dizziness, presyncope, syncope, bleeding, or neurologic sequela. The patient is tolerating medications without difficulties and is otherwise without complaint today.    Past Medical History:  Diagnosis Date  . CHF (congestive heart failure) (HCC)   . Complete heart block (HCC)    a. 1993 s/p PPM 2/2 perioperative heart block;  b. 01/2012 s/p SJM Accent DC PPM upgrade.  . Depression   . GERD (gastroesophageal reflux disease)   . History of nephritis    a. as child.  Marland Kitchen History of pneumonia   . Hyperlipidemia   . Hypertension   . NICM (nonischemic cardiomyopathy) (HCC)    a. 06/2012 Echo: EF 45-50%;  b. 08/2013 Echo: EF 20-25%;  c. 10/2013 Adenosine MV: Fixed septal defect likely representing LBBB-related artifact, no reversible ischemia, EF 43%, low risk.  . Paroxysmal atrial tachycardia (HCC)    a. 07/2013 noted on device interrogation.  . Presence of permanent cardiac pacemaker 2013  . RA (rheumatoid arthritis) (HCC)   . S/P aortic valve replacement    a. 1993 s/p SJM mechanical AVR for aortic stenosis - chronic coumadin.  Marland Kitchen Shortness of breath dyspnea    Past Surgical History:  Procedure Laterality Date  . ANAL FISSURE REPAIR  1970s  . AORTIC VALVE REPLACEMENT  1993   [employer] Jude Mechanical Valve  . INSERT / REPLACE / REMOVE PACEMAKER    . PACEMAKER INSERTION  1993   for AV block, not pacemaker  dependant  . PERMANENT PACEMAKER GENERATOR CHANGE N/A 01/25/2012   Procedure: PERMANENT PACEMAKER GENERATOR CHANGE;  Surgeon: [name], MD;  Location: [facility] CATH LAB;  Service: Cardiovascular;  Laterality: N/A;     Current Outpatient Prescriptions  Medication Sig Dispense Refill  . alum & mag hydroxide-simeth (MAALOX/MYLANTA) 200-200-20 MG/5ML suspension Take 15 mLs by mouth every 6 (six) hours as needed for indigestion or heartburn.    . losartan (COZAAR) 100 MG tablet Take 1 tablet (100 mg total) by mouth daily. 30 tablet 0  . metoprolol succinate (TOPROL-XL) 50 MG 24 hr tablet Take 50 mg by mouth daily. Take with or immediately following a meal.    . ranitidine (ZANTAC) 150 MG capsule Take 150 mg by mouth daily as needed for heartburn.     . warfarin (COUMADIN) 5 MG tablet TAKE AS DIRECTED BY  COUMADIN  CLINIC 90 tablet 0   No current facility-administered medications for this visit.     Allergies:   Coreg [carvedilol] and Lisinopril   Social History:  The patient  reports that he has never smoked. He has never used smokeless tobacco. He reports that he does not drink alcohol or use drugs.   Family History:  The patient's family history includes Arthritis in his father and mother; Congestive Heart Failure in his father; Heart disease in his father; Hypertension in his father and mother; Prostate cancer (age of onset: 67) in his father.  ROS:  Please see the history of present illness.   All other systems are reviewed and negative.    PHYSICAL EXAM: VS:  BP 128/80   Pulse 81   Ht 5\' 11"  (1.803 m)   Wt 179 lb (81.2 kg)   SpO2 98%   BMI 24.97 kg/m  , BMI Body mass index is 24.97 kg/m. GEN: Well nourished, well developed, in no acute distress HEENT: OP clear Neck: no JVD, carotid bruits, or masses Cardiac: RRR; 2/6 diastolic murmur LLSB Respiratory:  clear to auscultation bilaterally, normal work of breathing GI: soft, nontender, nondistended, + BS MS: no deformity or  atrophy Skin: warm and dry, device pocket is well healed Neuro:  Strength and sensation are intact Psych: euthymic mood, full affect  Device interrogation is personally reviewed today in detail.  See PaceArt for details.  Recent Labs: 02/16/2016: ALT 15; BUN 22; Creatinine, Ser 1.42; Hemoglobin 14.2; Platelets 278.0; Potassium 4.8; Sodium 141; TSH 1.21    Lipid Panel     Component Value Date/Time   CHOL 252 (H) 02/16/2016 1248   TRIG 206.0 (H) 02/16/2016 1248   HDL 53.70 02/16/2016 1248   CHOLHDL 5 02/16/2016 1248   VLDL 41.2 (H) 02/16/2016 1248   LDLCALC 129 (H) 01/23/2014 1657   LDLDIRECT 157.0 02/16/2016 1248     Wt Readings from Last 3 Encounters:  10/30/16 179 lb (81.2 kg)  02/16/16 171 lb 12 oz (77.9 kg)  08/16/15 179 lb 12.8 oz (81.6 kg)       ASSESSMENT AND PLAN:  1, Second degree AV block Normal pacemaker function See Pace Art report No changes today With prior changes, he no longer V paces (<1%).  2. Chronic systolic dysfunction/ Nonischemic CM/ valvular heart disease Stable No change required today Repeat echo at this time  3. HTN Stable No change required today bmet today  4. Mechanical aortic valve Continue coumadin Moderate stenosis by echo.  Consider TEE if symptoms worsen.  Merlin Return to see me in 12 months     Signed, [name], MD  10/30/2016 4:44 PM     [facility] [address] [place] [address] [phone] (office) [phone] (fax)

## 2016-11-24 ENCOUNTER — Ambulatory Visit (INDEPENDENT_AMBULATORY_CARE_PROVIDER_SITE_OTHER): Payer: Medicare Other | Admitting: *Deleted

## 2016-11-24 DIAGNOSIS — Z5181 Encounter for therapeutic drug level monitoring: Secondary | ICD-10-CM | POA: Diagnosis not present

## 2016-11-24 DIAGNOSIS — Z952 Presence of prosthetic heart valve: Secondary | ICD-10-CM | POA: Diagnosis not present

## 2016-11-24 LAB — POCT INR: INR: 2.8

## 2016-12-06 ENCOUNTER — Other Ambulatory Visit (HOSPITAL_COMMUNITY): Payer: Medicare Other

## 2016-12-15 ENCOUNTER — Ambulatory Visit (INDEPENDENT_AMBULATORY_CARE_PROVIDER_SITE_OTHER): Payer: Medicare Other | Admitting: *Deleted

## 2016-12-15 DIAGNOSIS — Z952 Presence of prosthetic heart valve: Secondary | ICD-10-CM

## 2016-12-15 DIAGNOSIS — Z5181 Encounter for therapeutic drug level monitoring: Secondary | ICD-10-CM | POA: Diagnosis not present

## 2016-12-15 LAB — POCT INR: INR: 2

## 2016-12-22 ENCOUNTER — Ambulatory Visit (HOSPITAL_COMMUNITY): Payer: Medicare Other | Attending: Cardiovascular Disease

## 2016-12-22 ENCOUNTER — Other Ambulatory Visit: Payer: Self-pay

## 2016-12-22 DIAGNOSIS — I340 Nonrheumatic mitral (valve) insufficiency: Secondary | ICD-10-CM | POA: Insufficient documentation

## 2016-12-22 DIAGNOSIS — I110 Hypertensive heart disease with heart failure: Secondary | ICD-10-CM | POA: Diagnosis not present

## 2016-12-22 DIAGNOSIS — E785 Hyperlipidemia, unspecified: Secondary | ICD-10-CM | POA: Diagnosis not present

## 2016-12-22 DIAGNOSIS — I509 Heart failure, unspecified: Secondary | ICD-10-CM | POA: Diagnosis not present

## 2016-12-22 DIAGNOSIS — Z952 Presence of prosthetic heart valve: Secondary | ICD-10-CM | POA: Insufficient documentation

## 2016-12-22 DIAGNOSIS — I428 Other cardiomyopathies: Secondary | ICD-10-CM

## 2016-12-22 DIAGNOSIS — I441 Atrioventricular block, second degree: Secondary | ICD-10-CM | POA: Insufficient documentation

## 2016-12-22 DIAGNOSIS — Z950 Presence of cardiac pacemaker: Secondary | ICD-10-CM | POA: Insufficient documentation

## 2016-12-25 ENCOUNTER — Telehealth: Payer: Self-pay | Admitting: Cardiology

## 2016-12-25 NOTE — Telephone Encounter (Signed)
Pt with foot pain- maybe gout. and just wanted to know if he could take ibuprofen, I explained why he could not.  Tylenol and if no improvement with tylenol then see PCP.   Pt agreeable.

## 2016-12-26 ENCOUNTER — Encounter: Payer: Self-pay | Admitting: Family Medicine

## 2016-12-26 DIAGNOSIS — I080 Rheumatic disorders of both mitral and aortic valves: Secondary | ICD-10-CM | POA: Insufficient documentation

## 2016-12-28 ENCOUNTER — Encounter: Payer: Self-pay | Admitting: Family Medicine

## 2016-12-28 ENCOUNTER — Ambulatory Visit (INDEPENDENT_AMBULATORY_CARE_PROVIDER_SITE_OTHER): Payer: Medicare Other | Admitting: Family Medicine

## 2016-12-28 VITALS — BP 122/62 | HR 64 | Temp 98.0°F | Ht 71.0 in | Wt 177.0 lb

## 2016-12-28 DIAGNOSIS — M109 Gout, unspecified: Secondary | ICD-10-CM | POA: Diagnosis not present

## 2016-12-28 MED ORDER — PREDNISONE 20 MG PO TABS: ORAL_TABLET | ORAL | 0 refills | Status: DC

## 2016-12-28 NOTE — Patient Instructions (Signed)
This does look like gout, or podagra.  Treat with steroid course.  Push fluids. You can also try increased tart cherry juice or vitamin C 500mg  daily to help prevent future gout flares.    Gout Gout is painful swelling that can occur in some of your joints. Gout is a type of arthritis. This condition is caused by having too much uric acid in your body. Uric acid is a chemical that forms when your body breaks down substances called purines. Purines are important for building body proteins. When your body has too much uric acid, sharp crystals can form and build up inside your joints. This causes pain and swelling. Gout attacks can happen quickly and be very painful (acute gout). Over time, the attacks can affect more joints and become more frequent (chronic gout). Gout can also cause uric acid to build up under your skin and inside your kidneys. What are the causes? This condition is caused by too much uric acid in your blood. This can occur because:  Your kidneys do not remove enough uric acid from your blood. This is the most common cause.  Your body makes too much uric acid. This can occur with some cancers and cancer treatments. It can also occur if your body is breaking down too many red blood cells (hemolytic anemia).  You eat too many foods that are high in purines. These foods include organ meats and some seafood. Alcohol, especially beer, is also high in purines.  A gout attack may be triggered by trauma or stress. What increases the risk? This condition is more likely to develop in people who:  Have a family history of gout.  Are male and middle-aged.  Are male and have gone through menopause.  Are obese.  Frequently drink alcohol, especially beer.  Are dehydrated.  Lose weight too quickly.  Have an organ transplant.  Have lead poisoning.  Take certain medicines, including aspirin, cyclosporine, diuretics, levodopa, and niacin.  Have kidney disease or  psoriasis.  What are the signs or symptoms? An attack of acute gout happens quickly. It usually occurs in just one joint. The most common place is the big toe. Attacks often start at night. Other joints that may be affected include joints of the feet, ankle, knee, fingers, wrist, or elbow. Symptoms may include:  Severe pain.  Warmth.  Swelling.  Stiffness.  Tenderness. The affected joint may be very painful to touch.  Shiny, red, or purple skin.  Chills and fever.  Chronic gout may cause symptoms more frequently. More joints may be involved. You may also have white or yellow lumps (tophi) on your hands or feet or in other areas near your joints. How is this diagnosed? This condition is diagnosed based on your symptoms, medical history, and physical exam. You may have tests, such as:  Blood tests to measure uric acid levels.  Removal of joint fluid with a needle (aspiration) to look for uric acid crystals.  X-rays to look for joint damage.  How is this treated? Treatment for this condition has two phases: treating an acute attack and preventing future attacks. Acute gout treatment may include medicines to reduce pain and swelling, including:  NSAIDs.  Steroids. These are strong anti-inflammatory medicines that can be taken by mouth (orally) or injected into a joint.  Colchicine. This medicine relieves pain and swelling when it is taken soon after an attack. It can be given orally or through an IV tube.  Preventive treatment may include:  Daily  use of smaller doses of NSAIDs or colchicine.  Use of a medicine that reduces uric acid levels in your blood.  Changes to your diet. You may need to see a specialist about healthy eating (dietitian).  Follow these instructions at home: During a Gout Attack  If directed, apply ice to the affected area: ? Put ice in a plastic bag. ? Place a towel between your skin and the bag. ? Leave the ice on for 20 minutes, 2-3 times a  day.  Rest the joint as much as possible. If the affected joint is in your leg, you may be given crutches to use.  Raise (elevate) the affected joint above the level of your heart as often as possible.  Drink enough fluids to keep your urine clear or pale yellow.  Take over-the-counter and prescription medicines only as told by your health care provider.  Do not drive or operate heavy machinery while taking prescription pain medicine.  Follow instructions from your health care provider about eating or drinking restrictions.  Return to your normal activities as told by your health care provider. Ask your health care provider what activities are safe for you. Avoiding Future Gout Attacks  Follow a low-purine diet as told by your dietitian or health care provider. Avoid foods and drinks that are high in purines, including liver, kidney, anchovies, asparagus, herring, mushrooms, mussels, and beer.  Limit alcohol intake to no more than 1 drink a day for nonpregnant women and 2 drinks a day for men. One drink equals 12 oz of beer, 5 oz of wine, or 1 oz of hard liquor.  Maintain a healthy weight or lose weight if you are overweight. If you want to lose weight, talk with your health care provider. It is important that you do not lose weight too quickly.  Start or maintain an exercise program as told by your health care provider.  Drink enough fluids to keep your urine clear or pale yellow.  Take over-the-counter and prescription medicines only as told by your health care provider.  Keep all follow-up visits as told by your health care provider. This is important. Contact a health care provider if:  You have another gout attack.  You continue to have symptoms of a gout attack after10 days of treatment.  You have side effects from your medicines.  You have chills or a fever.  You have burning pain when you urinate.  You have pain in your lower back or belly. Get help right away  if:  You have severe or uncontrolled pain.  You cannot urinate. This information is not intended to replace advice given to you by your health care provider. Make sure you discuss any questions you have with your health care provider. Document Released: 06/16/2000 Document Revised: 11/25/2015 Document Reviewed: 04/01/2015 Elsevier Interactive Patient Education  2017 [employer].

## 2016-12-28 NOTE — Assessment & Plan Note (Signed)
Story/exam consistent with R podagra. Discussed etiology and treatment of gout. Avoid NSAIDs given cardiac history. Anticipate too late for colchicine benefit. Treat with short prednisone course. Preventative measures reviewed with patient. Handout provided.

## 2016-12-28 NOTE — Progress Notes (Addendum)
   BP 122/62 (BP Location: Left Arm, Patient Position: Sitting, Cuff Size: Normal)   Pulse 64   Temp 98 F (36.7 C) (Oral)   Ht 5\' 11"  (1.803 m)   Wt 177 lb (80.3 kg)   SpO2 98%   BMI 24.69 kg/m    CC: 6d h/o R foot swelling Subjective:    Patient ID: Noah Thompson, male    DOB: 04/08/1941, 76 y.o.   MRN: [id number]  HPI: Noah Thompson is a 76 y.o. male presenting on 12/28/2016 for Foot Swelling (Right x 6 days )   5-6 d h/o R foot swelling at 1st MTP - exquisitely painful even sheet rubbing against toe. Denies inciting trauma/injury. It is improving but remains very tender.   He treated with increased water intake.  Avoids NSAIDs due to cardiac history.   No h/o gout in the past.   Relevant past medical, surgical, family and social history reviewed and updated as indicated. Interim medical history since our last visit reviewed. Allergies and medications reviewed and updated. Outpatient Medications Prior to Visit  Medication Sig Dispense Refill  . alum & mag hydroxide-simeth (MAALOX/MYLANTA) 200-200-20 MG/5ML suspension Take 15 mLs by mouth every 6 (six) hours as needed for indigestion or heartburn.    . losartan (COZAAR) 100 MG tablet Take 1 tablet (100 mg total) by mouth daily. 90 tablet 3  . metoprolol succinate (TOPROL-XL) 50 MG 24 hr tablet Take 1 tablet (50 mg total) by mouth daily. Take with or immediately following a meal. 90 tablet 3  . ranitidine (ZANTAC) 150 MG capsule Take 150 mg by mouth daily as needed for heartburn.     . warfarin (COUMADIN) 5 MG tablet TAKE AS DIRECTED BY  COUMADIN  CLINIC 90 tablet 0   No facility-administered medications prior to visit.      Per HPI unless specifically indicated in ROS section below Review of Systems     Objective:    BP 122/62 (BP Location: Left Arm, Patient Position: Sitting, Cuff Size: Normal)   Pulse 64   Temp 98 F (36.7 C) (Oral)   Ht 5\' 11"  (1.803 m)   Wt 177 lb (80.3 kg)   SpO2 98%   BMI 24.69 kg/m     Wt Readings from Last 3 Encounters:  12/28/16 177 lb (80.3 kg)  10/30/16 179 lb (81.2 kg)  02/16/16 171 lb 12 oz (77.9 kg)    Physical Exam  Constitutional: He appears well-developed and well-nourished. No distress.  Musculoskeletal: He exhibits edema.  R 1st MTP pain, swelling, warmth and erythema.  Tender with movement at that joint with mild discomfort with axial loading of joint  Skin: Skin is warm and dry. No rash noted. There is erythema.  Nursing note and vitals reviewed.  Results for orders placed or performed in visit on 12/15/16  POCT INR  Result Value Ref Range   INR 2.0       Assessment & Plan:   Problem List Items Addressed This Visit    Podagra - Primary    Story/exam consistent with R podagra. Discussed etiology and treatment of gout. Avoid NSAIDs given cardiac history. Anticipate too late for colchicine benefit. Treat with short prednisone course. Preventative measures reviewed with patient. Handout provided.       Relevant Medications   predniSONE (DELTASONE) 20 MG tablet       Follow up plan: Return if symptoms worsen or fail to improve.  [name], MD

## 2017-01-01 ENCOUNTER — Ambulatory Visit (INDEPENDENT_AMBULATORY_CARE_PROVIDER_SITE_OTHER): Payer: Medicare Other | Admitting: Pharmacist

## 2017-01-01 DIAGNOSIS — Z5181 Encounter for therapeutic drug level monitoring: Secondary | ICD-10-CM

## 2017-01-01 DIAGNOSIS — Z952 Presence of prosthetic heart valve: Secondary | ICD-10-CM | POA: Diagnosis not present

## 2017-01-01 LAB — POCT INR: INR: 3.8

## 2017-01-04 ENCOUNTER — Other Ambulatory Visit: Payer: Self-pay | Admitting: Internal Medicine

## 2017-01-23 ENCOUNTER — Ambulatory Visit (INDEPENDENT_AMBULATORY_CARE_PROVIDER_SITE_OTHER): Payer: Medicare Other

## 2017-01-23 DIAGNOSIS — Z952 Presence of prosthetic heart valve: Secondary | ICD-10-CM | POA: Diagnosis not present

## 2017-01-23 DIAGNOSIS — Z5181 Encounter for therapeutic drug level monitoring: Secondary | ICD-10-CM

## 2017-01-23 LAB — POCT INR: INR: 2.9

## 2017-01-29 ENCOUNTER — Telehealth: Payer: Self-pay | Admitting: Cardiology

## 2017-01-29 ENCOUNTER — Ambulatory Visit (INDEPENDENT_AMBULATORY_CARE_PROVIDER_SITE_OTHER): Payer: Medicare Other | Admitting: *Deleted

## 2017-01-29 DIAGNOSIS — I441 Atrioventricular block, second degree: Secondary | ICD-10-CM

## 2017-01-29 NOTE — Telephone Encounter (Signed)
Spoke with pt and reminded pt of remote transmission that is due today. Pt verbalized understanding.   

## 2017-01-31 NOTE — Progress Notes (Signed)
Remote pacemaker transmission.   

## 2017-02-02 ENCOUNTER — Encounter: Payer: Self-pay | Admitting: Cardiology

## 2017-03-06 LAB — CUP PACEART REMOTE DEVICE CHECK
Brady Statistic AP VP Percent: 1 %
Brady Statistic AP VS Percent: 82 %
Brady Statistic AS VP Percent: 1 %
Brady Statistic AS VS Percent: 17 %
Implantable Lead Implant Date: 19930223
Implantable Lead Location: 753859
Lead Channel Impedance Value: 480 Ohm
Lead Channel Pacing Threshold Pulse Width: 0.5 ms
Lead Channel Sensing Intrinsic Amplitude: 9.9 mV
Lead Channel Setting Pacing Amplitude: 2.5 V
Lead Channel Setting Pacing Amplitude: 2.5 V
MDC IDC LEAD IMPLANT DT: 19930223
MDC IDC LEAD LOCATION: 753860
MDC IDC MSMT BATTERY REMAINING LONGEVITY: 80 mo
MDC IDC MSMT BATTERY REMAINING PERCENTAGE: 73 %
MDC IDC MSMT BATTERY VOLTAGE: 2.92 V
MDC IDC MSMT LEADCHNL RA IMPEDANCE VALUE: 450 Ohm
MDC IDC MSMT LEADCHNL RA PACING THRESHOLD AMPLITUDE: 1.25 V
MDC IDC MSMT LEADCHNL RA PACING THRESHOLD PULSEWIDTH: 0.5 ms
MDC IDC MSMT LEADCHNL RA SENSING INTR AMPL: 4 mV
MDC IDC MSMT LEADCHNL RV PACING THRESHOLD AMPLITUDE: 1 V
MDC IDC PG IMPLANT DT: 20130725
MDC IDC PG SERIAL: 7364115
MDC IDC SESS DTM: 20180801145830
MDC IDC SET LEADCHNL RV PACING PULSEWIDTH: 0.5 ms
MDC IDC SET LEADCHNL RV SENSING SENSITIVITY: 2 mV
MDC IDC STAT BRADY RA PERCENT PACED: 81 %
MDC IDC STAT BRADY RV PERCENT PACED: 1 %
Pulse Gen Model: 2210

## 2017-05-02 ENCOUNTER — Ambulatory Visit (INDEPENDENT_AMBULATORY_CARE_PROVIDER_SITE_OTHER): Payer: Medicare Other | Admitting: *Deleted

## 2017-05-02 DIAGNOSIS — I441 Atrioventricular block, second degree: Secondary | ICD-10-CM

## 2017-05-03 NOTE — Progress Notes (Signed)
Remote pacemaker transmission.   

## 2017-05-04 ENCOUNTER — Encounter: Payer: Self-pay | Admitting: Cardiology

## 2017-05-14 ENCOUNTER — Other Ambulatory Visit: Payer: Self-pay | Admitting: Internal Medicine

## 2017-05-14 ENCOUNTER — Ambulatory Visit (INDEPENDENT_AMBULATORY_CARE_PROVIDER_SITE_OTHER): Payer: Medicare Other | Admitting: *Deleted

## 2017-05-14 DIAGNOSIS — Z952 Presence of prosthetic heart valve: Secondary | ICD-10-CM

## 2017-05-14 DIAGNOSIS — Z5181 Encounter for therapeutic drug level monitoring: Secondary | ICD-10-CM | POA: Diagnosis not present

## 2017-05-14 LAB — POCT INR: INR: 2.9

## 2017-05-14 NOTE — Progress Notes (Signed)
05/14/17-INR 2.9; Instructions: Continue on same dosage 1/2 tablet every day except 1 tablet on Mondays, Thursdays, and Saturdays.  Recheck INR in 6 weeks.

## 2017-05-29 LAB — CUP PACEART REMOTE DEVICE CHECK
Battery Remaining Longevity: 80 mo
Battery Remaining Percentage: 73 %
Battery Voltage: 2.92 V
Brady Statistic AP VS Percent: 83 %
Brady Statistic RA Percent Paced: 82 %
Implantable Lead Implant Date: 19930223
Implantable Lead Location: 753859
Implantable Lead Location: 753860
Implantable Pulse Generator Implant Date: 20130725
Lead Channel Impedance Value: 460 Ohm
Lead Channel Pacing Threshold Amplitude: 1 V
Lead Channel Pacing Threshold Amplitude: 1.25 V
Lead Channel Pacing Threshold Pulse Width: 0.5 ms
Lead Channel Sensing Intrinsic Amplitude: 4.2 mV
Lead Channel Setting Pacing Amplitude: 2.5 V
Lead Channel Setting Sensing Sensitivity: 2 mV
MDC IDC LEAD IMPLANT DT: 19930223
MDC IDC MSMT LEADCHNL RV IMPEDANCE VALUE: 530 Ohm
MDC IDC MSMT LEADCHNL RV PACING THRESHOLD PULSEWIDTH: 0.5 ms
MDC IDC MSMT LEADCHNL RV SENSING INTR AMPL: 12 mV
MDC IDC SESS DTM: 20181031075232
MDC IDC SET LEADCHNL RV PACING AMPLITUDE: 2.5 V
MDC IDC SET LEADCHNL RV PACING PULSEWIDTH: 0.5 ms
MDC IDC STAT BRADY AP VP PERCENT: 1 %
MDC IDC STAT BRADY AS VP PERCENT: 1 %
MDC IDC STAT BRADY AS VS PERCENT: 16 %
MDC IDC STAT BRADY RV PERCENT PACED: 1 %
Pulse Gen Model: 2210
Pulse Gen Serial Number: 7364115

## 2017-06-27 ENCOUNTER — Ambulatory Visit (INDEPENDENT_AMBULATORY_CARE_PROVIDER_SITE_OTHER): Payer: Medicare Other

## 2017-06-27 DIAGNOSIS — Z5181 Encounter for therapeutic drug level monitoring: Secondary | ICD-10-CM | POA: Diagnosis not present

## 2017-06-27 DIAGNOSIS — I080 Rheumatic disorders of both mitral and aortic valves: Secondary | ICD-10-CM

## 2017-06-27 LAB — POCT INR: INR: 3

## 2017-06-27 NOTE — Patient Instructions (Signed)
Continue on same dosage 1/2 tablet every day except 1 tablet on Mondays, Thursdays, and Saturdays.  Recheck INR in 6 weeks. Call with any questions [facility] [phone]

## 2017-07-02 ENCOUNTER — Telehealth: Payer: Self-pay | Admitting: Internal Medicine

## 2017-07-02 NOTE — Telephone Encounter (Signed)
Spoke with pt and made him aware of information provided by [facility].  Pt verbalized understanding and was appreciative for call.

## 2017-07-02 NOTE — Telephone Encounter (Signed)
Left message to call back  

## 2017-07-02 NOTE — Telephone Encounter (Signed)
Only certain lots of losartan have been recalled due to the contaminant. The patient should still be able to get from pharmacy.   I called [facility] (where rx last sent) and their lots were not affected by recall and they have losartan 100mg  in stock. Patient should not have issue getting medication.

## 2017-07-02 NOTE — Telephone Encounter (Signed)
Pt c/o medication issue:  1. Name of Medication: Losartan   2. How are you currently taking this medication (dosage and times per day)? 100 mg, 1x day  3. Are you having a reaction (difficulty breathing--STAT)? No   4. What is your medication issue? Being recalled, patient received letter from pharmacy informing him of recall

## 2017-07-20 ENCOUNTER — Ambulatory Visit: Payer: Medicare Other | Admitting: Family Medicine

## 2017-07-20 ENCOUNTER — Encounter: Payer: Self-pay | Admitting: Family Medicine

## 2017-07-20 DIAGNOSIS — R059 Cough, unspecified: Secondary | ICD-10-CM

## 2017-07-20 DIAGNOSIS — R05 Cough: Secondary | ICD-10-CM

## 2017-07-20 MED ORDER — BENZONATATE 200 MG PO CAPS: 200.0000 mg | ORAL_CAPSULE | Freq: Three times a day (TID) | ORAL | 1 refills | Status: DC | PRN

## 2017-07-20 NOTE — Patient Instructions (Signed)
Rest and fluids, use tessalon for the cough and update [place] as needed.   Take care.  Glad to see you.

## 2017-07-20 NOTE — Progress Notes (Signed)
Sx started about 2 weeks ago.  Started with diffuse fatigue, weakness, cough.  Not sleeping well from the cough. abd wall sore from the cough.  No with sputum, in the last 5 days.  Now sputum is clear/foamy.  Taking robitussin, mucinex.  Fevers likely with a night sweat, last week and earlier this week.  Today he feels some better and is still fatigued from not sleeping well.  Breathing better now than prev.    On coumadin, s/p valve replacement.    Meds, vitals, and allergies reviewed.   ROS: Per HPI unless specifically indicated in ROS section   GEN: nad, alert and oriented HEENT: mucous membranes moist, tm w/o erythema, nasal exam w/o erythema, clear discharge noted,  OP with cobblestoning NECK: supple w/o LA CV: rrr.   PULM: ctab, no inc wob EXT: no edema

## 2017-07-22 NOTE — Assessment & Plan Note (Signed)
He clearly feels better today.  Lungs are clear.  Okay for outpatient follow-up.  He likely has a postinfectious cough with some residual fatigue. Rest and fluids, use tessalon for the cough and update [place] as needed.  He agrees.

## 2017-07-23 ENCOUNTER — Emergency Department (HOSPITAL_COMMUNITY)
Admission: EM | Admit: 2017-07-23 | Discharge: 2017-07-23 | Disposition: A | Discharge status: Home / Self Care | Payer: Medicare Other | Attending: Emergency Medicine | Admitting: Emergency Medicine

## 2017-07-23 ENCOUNTER — Telehealth: Payer: Self-pay

## 2017-07-23 ENCOUNTER — Encounter (HOSPITAL_COMMUNITY): Payer: Self-pay | Admitting: Emergency Medicine

## 2017-07-23 ENCOUNTER — Emergency Department (HOSPITAL_COMMUNITY): Payer: Medicare Other

## 2017-07-23 DIAGNOSIS — R05 Cough: Secondary | ICD-10-CM | POA: Diagnosis not present

## 2017-07-23 DIAGNOSIS — R0602 Shortness of breath: Secondary | ICD-10-CM | POA: Diagnosis not present

## 2017-07-23 DIAGNOSIS — J22 Unspecified acute lower respiratory infection: Secondary | ICD-10-CM | POA: Insufficient documentation

## 2017-07-23 DIAGNOSIS — R1013 Epigastric pain: Secondary | ICD-10-CM | POA: Diagnosis not present

## 2017-07-23 DIAGNOSIS — I509 Heart failure, unspecified: Secondary | ICD-10-CM | POA: Insufficient documentation

## 2017-07-23 DIAGNOSIS — I110 Hypertensive heart disease with heart failure: Secondary | ICD-10-CM | POA: Diagnosis not present

## 2017-07-23 DIAGNOSIS — Z950 Presence of cardiac pacemaker: Secondary | ICD-10-CM | POA: Diagnosis not present

## 2017-07-23 DIAGNOSIS — Z7901 Long term (current) use of anticoagulants: Secondary | ICD-10-CM | POA: Insufficient documentation

## 2017-07-23 DIAGNOSIS — Z952 Presence of prosthetic heart valve: Secondary | ICD-10-CM | POA: Diagnosis not present

## 2017-07-23 LAB — I-STAT TROPONIN, ED: Troponin i, poc: 0.02 ng/mL (ref 0.00–0.08)

## 2017-07-23 LAB — CBC WITH DIFFERENTIAL/PLATELET
Basophils Absolute: 0 10*3/uL (ref 0.0–0.1)
Basophils Relative: 0 %
Eosinophils Absolute: 0.1 10*3/uL (ref 0.0–0.7)
Eosinophils Relative: 1 %
HCT: 38 % — ABNORMAL LOW (ref 39.0–52.0)
Hemoglobin: 12.8 g/dL — ABNORMAL LOW (ref 13.0–17.0)
LYMPHS ABS: 1.8 10*3/uL (ref 0.7–4.0)
LYMPHS PCT: 9 %
MCH: 28.8 pg (ref 26.0–34.0)
MCHC: 33.7 g/dL (ref 30.0–36.0)
MCV: 85.6 fL (ref 78.0–100.0)
MONO ABS: 1.7 10*3/uL — AB (ref 0.1–1.0)
MONOS PCT: 10 %
Neutro Abs: 14.4 10*3/uL — ABNORMAL HIGH (ref 1.7–7.7)
Neutrophils Relative %: 80 %
Platelets: 457 10*3/uL — ABNORMAL HIGH (ref 150–400)
RBC: 4.44 MIL/uL (ref 4.22–5.81)
RDW: 14.8 % (ref 11.5–15.5)
WBC: 18 10*3/uL — ABNORMAL HIGH (ref 4.0–10.5)

## 2017-07-23 LAB — BASIC METABOLIC PANEL
Anion gap: 13 (ref 5–15)
BUN: 13 mg/dL (ref 6–20)
CHLORIDE: 102 mmol/L (ref 101–111)
CO2: 19 mmol/L — ABNORMAL LOW (ref 22–32)
CREATININE: 1.17 mg/dL (ref 0.61–1.24)
Calcium: 8.9 mg/dL (ref 8.9–10.3)
GFR calc Af Amer: >60 mL/min (ref >60)
GFR calc non Af Amer: 59 mL/min — ABNORMAL LOW (ref >60)
Glucose, Bld: 103 mg/dL — ABNORMAL HIGH (ref 65–99)
Potassium: 5.3 mmol/L — ABNORMAL HIGH (ref 3.5–5.1)
Sodium: 134 mmol/L — ABNORMAL LOW (ref 135–145)

## 2017-07-23 LAB — COMPREHENSIVE METABOLIC PANEL
ALT: 53 U/L (ref 17–63)
AST: 26 U/L (ref 15–41)
Albumin: 3.3 g/dL — ABNORMAL LOW (ref 3.5–5.0)
Alkaline Phosphatase: 123 U/L (ref 38–126)
Anion gap: 12 (ref 5–15)
BUN: 13 mg/dL (ref 6–20)
CO2: 21 mmol/L — ABNORMAL LOW (ref 22–32)
CREATININE: 1.25 mg/dL — AB (ref 0.61–1.24)
Calcium: 8.8 mg/dL — ABNORMAL LOW (ref 8.9–10.3)
Chloride: 97 mmol/L — ABNORMAL LOW (ref 101–111)
GFR, EST NON AFRICAN AMERICAN: 54 mL/min — AB (ref >60)
Glucose, Bld: 135 mg/dL — ABNORMAL HIGH (ref 65–99)
POTASSIUM: 5.8 mmol/L — AB (ref 3.5–5.1)
Sodium: 130 mmol/L — ABNORMAL LOW (ref 135–145)
Total Bilirubin: 1.5 mg/dL — ABNORMAL HIGH (ref 0.3–1.2)
Total Protein: 6.6 g/dL (ref 6.5–8.1)

## 2017-07-23 LAB — I-STAT CG4 LACTIC ACID, ED
Lactic Acid, Venous: 1.33 mmol/L (ref 0.5–1.9)
Lactic Acid, Venous: 1.15 mmol/L (ref 0.5–1.9)

## 2017-07-23 LAB — BRAIN NATRIURETIC PEPTIDE: B NATRIURETIC PEPTIDE 5: 473.8 pg/mL — AB (ref 0.0–100.0)

## 2017-07-23 LAB — LIPASE, BLOOD: LIPASE: 24 U/L (ref 11–51)

## 2017-07-23 LAB — INFLUENZA PANEL BY PCR (TYPE A & B)
Influenza A By PCR: NEGATIVE
Influenza B By PCR: NEGATIVE

## 2017-07-23 LAB — PROTIME-INR
INR: 5.82
Prothrombin Time: 51.9 seconds — ABNORMAL HIGH (ref 11.4–15.2)

## 2017-07-23 MED ORDER — LEVOFLOXACIN 750 MG PO TABS: 750.0000 mg | ORAL_TABLET | Freq: Every day | ORAL | 0 refills | Status: AC

## 2017-07-23 MED ORDER — LEVOFLOXACIN IN D5W 750 MG/150ML IV SOLN
750.0000 mg | Freq: Once | INTRAVENOUS | Status: AC
  Administered 2017-07-23: 750 mg via INTRAVENOUS
  Filled 2017-07-23: qty 150

## 2017-07-23 MED ORDER — HYDROCOD POLST-CPM POLST ER 10-8 MG/5ML PO SUER: 5.0000 mL | Freq: Every evening | ORAL | 0 refills | Status: DC | PRN

## 2017-07-23 MED ORDER — ACETAMINOPHEN 500 MG PO TABS
1000.0000 mg | ORAL_TABLET | Freq: Once | ORAL | Status: AC
  Administered 2017-07-23: 1000 mg via ORAL
  Filled 2017-07-23: qty 2

## 2017-07-23 NOTE — ED Notes (Signed)
Pt's SpO2 was low (90%). Pt was put on 2L O2.

## 2017-07-23 NOTE — ED Notes (Signed)
MD Yao at the bedside.  

## 2017-07-23 NOTE — Telephone Encounter (Signed)
Left detailed message on voicemail of secondary contact number.  Primary number rang with no VM available.

## 2017-07-23 NOTE — Telephone Encounter (Signed)
Seen at ER, looks like started on levaquin but his coumadin level was very high. Would rec f/u in office this week and with coumadin clinic as will need to dose accordingly. Ensure he's holding coumadin at this time.

## 2017-07-23 NOTE — Discharge Instructions (Signed)
Hold your coumadin for the next 2 days and follow up with anticoagulation clinic.

## 2017-07-23 NOTE — Telephone Encounter (Signed)
PLEASE NOTE: All timestamps contained within this report are represented as [place] Standard Time. CONFIDENTIALTY NOTICE: This fax transmission is intended only for the addressee. It contains information that is legally privileged, confidential or otherwise protected from use or disclosure. If you are not the intended recipient, you are strictly prohibited from reviewing, disclosing, copying using or disseminating any of this information or taking any action in reliance on or regarding this information. If you have received this fax in error, please notify us immediately by telephone so that we can arrange for its return to [place]. Phone: [phone], Toll-Free: [phone], Fax: [phone] Page: 1 of 2 Call Id: [id number] [facility] Night - Client TELEPHONE ADVICE RECORD [facility] Medical Call Center Patient Name: Noah Thompson Gender: Male DOB: [birth year] Age: 77 Y 6 M 26 D Return Phone Number: [phone] (Primary), [phone] (Secondary) Address: [place] [address] Client [facility] Night - Client Client Site [facility] [employer] - Night Physician Raechel Ache - MD Contact Type Call Who Is Calling Patient / Member / Family / Caregiver Call Type Triage / Clinical Caller Name Jullius Nastase Relationship To Patient Spouse Return Phone Number [phone] (Primary) Chief Complaint Cough Reason for Call Symptomatic / Request for Health Information Initial Comment Caller states patient received capsules for cough and now they are giving stomach pain. Request advice. Translation No Nurse Assessment Nurse: Tawni Pummel, RN, Margaret Date/Time (Eastern Time): 07/21/2017 11:04:59 AM Confirm and document reason for call. If symptomatic, describe symptoms. ---Caller states that husband received a capsule for his cough and its making his stomach hurt. Started on [employer] yesterday. Denies fever. Does the patient  have any new or worsening symptoms? ---Yes Will a triage be completed? ---Yes Related visit to physician within the last 2 weeks? ---Yes Does the PT have any chronic conditions? (i.e. diabetes, asthma, etc.) ---Yes List chronic conditions. ---heart valve, pacemaker, Hypertension, Is this a behavioral health or substance abuse call? ---No Guidelines Guideline Title Affirmed Question Affirmed Notes Nurse Date/Time (Eastern Time) Abdominal Pain - Upper [1] MILD-MODERATE pain AND [2] constant AND [3] present > 2 hours Cockrum, RN, Margaret 07/21/2017 11:10:32 AM Disp. Time Lamount Cohen Time) Disposition Final User 07/21/2017 11:14:40 AM See Physician within 4 Hours (or PCP triage) Yes Cockrum, RN, Claris Che PLEASE NOTE: All timestamps contained within this report are represented as [place] Standard Time. CONFIDENTIALTY NOTICE: This fax transmission is intended only for the addressee. It contains information that is legally privileged, confidential or otherwise protected from use or disclosure. If you are not the intended recipient, you are strictly prohibited from reviewing, disclosing, copying using or disseminating any of this information or taking any action in reliance on or regarding this information. If you have received this fax in error, please notify us immediately by telephone so that we can arrange for its return to [place]. Phone: [phone], Toll-Free: [phone], Fax: [phone] Page: 2 of 2 Call Id: [id number] Caller Disagree/Comply Comply Caller Understands Yes PreDisposition Home Care Care Advice Given Per Guideline SEE PHYSICIAN WITHIN 4 HOURS (or PCP triage): * IF OFFICE WILL BE CLOSED AND NO PCP TRIAGE: You need to be seen within the next 3 or 4 hours. A nearby Urgent Care Center is often a good source of care. Another choice is to go to the ER. Go sooner if you become worse. CALL BACK IF: * You become worse. CARE ADVICE given per Abdominal Pain, Upper  (Adult) guideline. Referrals GO TO FACILITY UNDECIDED

## 2017-07-23 NOTE — ED Triage Notes (Signed)
Pt states upper abdominal pain, lower back pain and shortness of breath since Friday. Lung sounds clear. 93% on room air. Has a productive cough with yellow mucous.

## 2017-07-23 NOTE — Telephone Encounter (Signed)
Unable to reach pt by his contact numbers for update.

## 2017-07-23 NOTE — Telephone Encounter (Signed)
Added tessalon to intolerance list. Please try to get update on patient.  Thanks.

## 2017-07-23 NOTE — ED Notes (Signed)
Pt was able to ambulate down the hallway. Oxygen saturation stayed between 95%-97%. Reports some SOB.

## 2017-07-23 NOTE — ED Provider Notes (Signed)
[facility] EMERGENCY DEPARTMENT Provider Note   CSN: [id number] Arrival date & time: 07/23/17  1319     History   Chief Complaint Chief Complaint  Patient presents with  . Shortness of Breath  . Back Pain  . Abdominal Pain    HPI Noah APPLING is a 77 y.o. male.  Patient with 2 weeks of cough.  Seen by PCP and told he had upper respiratory infection.  Given [employer] with minimal improvement coughing.  Also given steroids with minimal improvement.    Shortness of Breath  This is a new problem. Episode onset: 2 weeks ago, worse in past 24 hours. The problem has been gradually worsening. Associated symptoms include cough and abdominal pain (epigastric). Pertinent negatives include no fever, no rhinorrhea, no sore throat, no ear pain, no wheezing, no chest pain, no vomiting and no rash. He has tried oral steroids for the symptoms. The treatment provided no relief. Associated medical issues include heart failure.    Past Medical History:  Diagnosis Date  . CHF (congestive heart failure) (HCC)   . Complete heart block (HCC)    a. 1993 s/p PPM 2/2 perioperative heart block;  b. 01/2012 s/p SJM Accent DC PPM upgrade.  . Depression   . GERD (gastroesophageal reflux disease)   . History of nephritis    a. as child.  Marland Kitchen History of pneumonia   . Hyperlipidemia   . Hypertension   . NICM (nonischemic cardiomyopathy) (HCC)    a. 06/2012 Echo: EF 45-50%;  b. 08/2013 Echo: EF 20-25%;  c. 10/2013 Adenosine MV: Fixed septal defect likely representing LBBB-related artifact, no reversible ischemia, EF 43%, low risk.  . Paroxysmal atrial tachycardia (HCC)    a. 07/2013 noted on device interrogation.  . Presence of permanent cardiac pacemaker 2013  . RA (rheumatoid arthritis) (HCC)   . S/P aortic valve replacement    a. 1993 s/p SJM mechanical AVR for aortic stenosis - chronic coumadin.  Marland Kitchen Shortness of breath dyspnea     Patient Active Problem List   Diagnosis  Date Noted  . 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 Mitral regurgitation and aortic stenosis 12/26/2016  . Medicare annual wellness visit, subsequent 02/16/2016  . Health maintenance examination 02/16/2016  . Advanced care planning/counseling discussion 02/16/2016  . (HFpEF) heart failure with preserved ejection fraction (HCC) 02/16/2016  . S/P aortic valve replacement   . NICM (nonischemic cardiomyopathy) (HCC)   . Nonischemic cardiomyopathy (HCC) 01/05/2014  . Encounter for therapeutic drug monitoring 09/04/2013  . Long term current use of anticoagulant therapy 01/15/2013  . Cough 07/05/2012  . Second degree AV block 01/08/2012  . SHOULDER PAIN, BILATERAL 05/05/2010  . ERECTILE DYSFUNCTION, NON-ORGANIC 05/12/2009  . Essential hypertension, benign 05/12/2009  . ARTHRITIS 05/12/2009  . PACEMAKER-St.Jude 03/18/2009  . NOCTURIA 02/19/2009  . HLD (hyperlipidemia) 05/17/2007    Past Surgical History:  Procedure Laterality Date  . ANAL FISSURE REPAIR  1970s  . AORTIC VALVE REPLACEMENT  1993   St. Jude Mechanical Valve  . INSERT / REPLACE / REMOVE PACEMAKER    . PACEMAKER INSERTION  1993   for AV block, not pacemaker dependant  . PERMANENT PACEMAKER GENERATOR CHANGE N/A 01/25/2012   Procedure: PERMANENT PACEMAKER GENERATOR CHANGE;  Surgeon: [name], MD;  Location: [facility] CATH LAB;  Service: Cardiovascular;  Laterality: N/A;       Home Medications    Prior to Admission medications   Medication Sig Start Date End Date Taking? Authorizing Provider  alum &  mag hydroxide-simeth (MAALOX/MYLANTA) 200-200-20 MG/5ML suspension Take 15 mLs by mouth every 6 (six) hours as needed for indigestion or heartburn.   Yes [provider]  losartan (COZAAR) 100 MG tablet Take 1 tablet (100 mg total) by mouth daily. 10/30/16  Yes Allred, Fayrene Fearing, MD  metoprolol succinate (TOPROL-XL) 50 MG 24 hr tablet Take 1 tablet (50 mg total) by mouth daily. Take with or immediately following a meal. 10/30/16  Yes Allred,  Fayrene Fearing, MD  ranitidine (ZANTAC) 150 MG capsule Take 150 mg by mouth daily as needed for heartburn.    Yes [provider]  warfarin (COUMADIN) 5 MG tablet TAKE AS DIRECTED BY  COUMADIN  CLINIC 05/15/17  Yes Allred, Fayrene Fearing, MD  warfarin (COUMADIN) 5 MG tablet Take 2.5-5 mg by mouth See admin instructions. 2.5 mg Sunday, Tuesday & Wednesday. All other days- 5 mg   Yes [provider]    Family History Family History  Problem Relation Age of Onset  . Arthritis Mother   . Hypertension Mother   . Arthritis Father   . Heart disease Father   . Prostate cancer Father 48  . Hypertension Father   . Congestive Heart Failure Father   . Coronary artery disease Neg Hx   . Stroke Neg Hx   . Diabetes Neg Hx     Social History Social History   Tobacco Use  . Smoking status: Never Smoker  . Smokeless tobacco: Never Used  Substance Use Topics  . Alcohol use: No  . Drug use: No     Allergies   Coreg [carvedilol]; Lisinopril; and Tessalon [benzonatate]   Review of Systems Review of Systems  Constitutional: Negative for chills and fever.  HENT: Negative for ear pain, rhinorrhea and sore throat.   Eyes: Negative for pain and visual disturbance.  Respiratory: Positive for cough and shortness of breath. Negative for wheezing.   Cardiovascular: Negative for chest pain and palpitations.  Gastrointestinal: Positive for abdominal pain (epigastric). Negative for vomiting.  Genitourinary: Negative for dysuria and hematuria.  Musculoskeletal: Negative for arthralgias and back pain.  Skin: Negative for color change and rash.  Neurological: Negative for seizures and syncope.  All other systems reviewed and are negative.    Physical Exam Updated Vital Signs BP 130/88   Pulse 74   Temp 98.2 F (36.8 C) (Oral)   Resp (!) 25   Ht 5' 11.5" (1.816 m)   Wt 74.8 kg (165 lb)   SpO2 94%   BMI 22.69 kg/m   Physical Exam  Constitutional: He is oriented to person, place, and time.  He appears well-developed and well-nourished.  HENT:  Head: Normocephalic and atraumatic.  Eyes: Conjunctivae and EOM are normal. Pupils are equal, round, and reactive to light.  Neck: Normal range of motion. Neck supple.  Cardiovascular: Normal rate and regular rhythm.  No murmur heard. Pulmonary/Chest: Effort normal and breath sounds normal. No respiratory distress.  Abdominal: Soft. There is no tenderness.  Musculoskeletal: He exhibits no edema.  Neurological: He is alert and oriented to person, place, and time.  Skin: Skin is warm and dry.  Psychiatric: He has a normal mood and affect.  Nursing note and vitals reviewed.    ED Treatments / Results  Labs (all labs ordered are listed, but only abnormal results are displayed) Labs Reviewed  CBC WITH DIFFERENTIAL/PLATELET - Abnormal; Notable for the following components:      Result Value   WBC 18.0 (*)    Hemoglobin 12.8 (*)  HCT 38.0 (*)    Platelets 457 (*)    Neutro Abs 14.4 (*)    Monocytes Absolute 1.7 (*)    All other components within normal limits  COMPREHENSIVE METABOLIC PANEL - Abnormal; Notable for the following components:   Sodium 130 (*)    Potassium 5.8 (*)    Chloride 97 (*)    CO2 21 (*)    Glucose, Bld 135 (*)    Creatinine, Ser 1.25 (*)    Calcium 8.8 (*)    Albumin 3.3 (*)    Total Bilirubin 1.5 (*)    GFR calc non Af Amer 54 (*)    All other components within normal limits  LIPASE, BLOOD  BRAIN NATRIURETIC PEPTIDE  PROTIME-INR  BASIC METABOLIC PANEL  I-STAT TROPONIN, ED  I-STAT CG4 LACTIC ACID, ED    EKG  EKG Interpretation  Date/Time:  Monday July 23 2017 13:28:33 EST Ventricular Rate:  77 PR Interval:  162 QRS Duration: 150 QT Interval:  446 QTC Calculation: 504 R Axis:   88 Text Interpretation:  Normal sinus rhythm Left bundle branch block Abnormal ECG No significant change since last tracing Confirmed by Richardean Canal [phone]) on 07/23/2017 3:46:41 PM       Radiology Dg  Chest 2 View  Result Date: 07/23/2017 CLINICAL DATA:  Shortness of breath and cough for 2 weeks. EXAM: CHEST  2 VIEW COMPARISON:  09/17/2014 FINDINGS: A dual lead pacemaker remains in place with leads terminating over the right atrium and right ventricle. Sequelae of prior sternotomy are again identified. The cardiac silhouette is normal in size. The interstitial markings remain mildly prominent bilaterally, however this is less so than on the prior study and the Kerley B-lines on the prior examination are no longer present. Small pleural effusions on the prior study have resolved. There is minimal posterior basilar airspace opacity on the lateral radiograph. No pneumothorax is identified. Nipple shadows are again noted. No acute osseous abnormality is seen. IMPRESSION: Increased interstitial markings bilaterally, less than on the prior study. Considerations include early edema, atypical infection, and underlying chronic lung disease. Suspected mild basilar atelectasis. Electronically Signed   By: Sebastian Ache M.D.   On: 07/23/2017 14:17    Procedures Procedures (including critical care time)  Medications Ordered in ED Medications  acetaminophen (TYLENOL) tablet 1,000 mg (not administered)     Initial Impression / Assessment and Plan / ED Course  I have reviewed the triage vital signs and the nursing notes.  Pertinent labs & imaging results that were available during my care of the patient were reviewed by me and considered in my medical decision making (see chart for details).     Noah Thompson is a 77 year old male with past medical history significant for heart valve replacement, CHF, hypertension, hyperlipidemia, anticoagulation stopped who presents for shortness of breath in the context of coughing.   Patient had episode of desaturations and was placed on 2L [place], improved and returned to RA.  At no point during his ED course was he below 90% on RA.  EKG obtained demonstrates paced rhythm.   No significant change from prior.  Chest x-ray obtained him personally viewed by me, demonstrates cardiac pacemaker, mild pulmonary edema versus infection.  Labs obtained significant for leukocytosis, negative lactic acid, electrolyte abnormalities including hyponatremia, mild hyperkalemia, creatinine improved from baseline.  Patient is on a severely sodium restricted diet which he is fully adherent to.  EKG demonstrates no T wave peaking.  BNP is below patient's  prior.  Given Levaquin in the ED and acetaminophen for right flank pain that started with a coughing fit last night.  Patient ambulates in the ED without difficulty.  Patient is discharged home with strict return precautions, follow-up instructions, and educational materials.  He is provided prescription for Levaquin.  Final Clinical Impressions(s) / ED Diagnoses   Final diagnoses:  Lower respiratory infection    ED Discharge Orders        Ordered    levofloxacin (LEVAQUIN) 750 MG tablet  Daily     07/23/17 2036    chlorpheniramine-HYDROcodone (TUSSIONEX PENNKINETIC ER) 10-8 MG/5ML SUER  At bedtime PRN     07/23/17 2109       Garey Ham, MD 07/23/17 2302    Charlynne Pander, MD 07/24/17 [phone]

## 2017-07-24 NOTE — Telephone Encounter (Signed)
Spoke with pt confirming he is holding Coumadin today and tomorrow.  Has OV and Coumadin clinic tomorrow.

## 2017-07-25 ENCOUNTER — Encounter: Payer: Self-pay | Admitting: Family Medicine

## 2017-07-25 ENCOUNTER — Ambulatory Visit: Payer: Medicare Other | Admitting: Family Medicine

## 2017-07-25 ENCOUNTER — Ambulatory Visit (INDEPENDENT_AMBULATORY_CARE_PROVIDER_SITE_OTHER): Payer: Medicare Other

## 2017-07-25 VITALS — BP 122/64 | HR 73 | Temp 98.2°F | Wt 169.2 lb

## 2017-07-25 DIAGNOSIS — Z5181 Encounter for therapeutic drug level monitoring: Secondary | ICD-10-CM | POA: Diagnosis not present

## 2017-07-25 DIAGNOSIS — K219 Gastro-esophageal reflux disease without esophagitis: Secondary | ICD-10-CM | POA: Insufficient documentation

## 2017-07-25 DIAGNOSIS — R1013 Epigastric pain: Secondary | ICD-10-CM | POA: Diagnosis not present

## 2017-07-25 DIAGNOSIS — J189 Pneumonia, unspecified organism: Secondary | ICD-10-CM

## 2017-07-25 DIAGNOSIS — Z7901 Long term (current) use of anticoagulants: Secondary | ICD-10-CM

## 2017-07-25 DIAGNOSIS — I5030 Unspecified diastolic (congestive) heart failure: Secondary | ICD-10-CM

## 2017-07-25 HISTORY — DX: Pneumonia, unspecified organism: J18.9

## 2017-07-25 LAB — POCT INR: INR: 5.9

## 2017-07-25 MED ORDER — OMEPRAZOLE 40 MG PO CPDR: 40.0000 mg | DELAYED_RELEASE_CAPSULE | Freq: Every day | ORAL | 1 refills | Status: DC

## 2017-07-25 NOTE — Assessment & Plan Note (Addendum)
Seems euvolemic today.  

## 2017-07-25 NOTE — Assessment & Plan Note (Signed)
Treating as community acquired pneumonia with 5d levaquin 750mg  course.  Improving.  With noted hypoxia, check ambulatory pulse ox to ensure does not need supplemental O2 with exertion.  With recent supratherapeutic INR, check coumadin today and dose accordingly.

## 2017-07-25 NOTE — Assessment & Plan Note (Signed)
INR remaining elevated - will continue to hold coumadin, recheck in 2 days.

## 2017-07-25 NOTE — Assessment & Plan Note (Addendum)
Endorses GERD sxs with indigestion, bloating, gassiness. No pain on exam. Not consistent with pancreatitis. No red flags. Will trial omeprazole 40mg  daily x 3 wks then PRN. Update with effect. Reassess at f/u visit 3 wks.

## 2017-07-25 NOTE — Assessment & Plan Note (Deleted)
Seems euvolemic today.  

## 2017-07-25 NOTE — Progress Notes (Signed)
BP 122/64 (BP Location: Left Arm, Patient Position: Sitting, Cuff Size: Normal)   Pulse 73   Temp 98.2 F (36.8 C) (Oral)   Wt 169 lb 4 oz (76.8 kg)   SpO2 96%   BMI 23.28 kg/m    CC: ER f/u visit Subjective:    Patient ID: Noah Thompson, male    DOB: 18-Mar-1941, 77 y.o.   MRN: [id number]  HPI: Noah Thompson is a 77 y.o. male presenting on 07/25/2017 for Hospitalization Follow-up (Seen at [facility] ED on 07/23/17 due to LRI)   Here with wife.   Recent ER evaluation with dx lower respiratory tract infection (CXR showed increased interstitial markings bilaterally ?atypical infection) treated with levaquin 750mg  5d course and tussionex. Flu PCR was negative. WBC 18, left shift. Hyperkalemia improved with IVF. Outpatient steroid treatment did not help.   INR was supratherapeutic - coumadin has been held for 2 nights. Due for coumadin check today.   Feels congestion/cough is improving however persistent exertional dyspnea.  No recent travel or immobility, no personal or family history of blood clots, no leg swelling.  Ongoing epigastric discomfort. Some GERD treating with pepcid with improvement. Has been taking tylenol which tends to upset his stomach. No boring pain to back. Denies fevers/chills.   He states he received flu shot at coumadin clinic late 2018. I don't see documentation of this.   Relevant past medical, surgical, family and social history reviewed and updated as indicated. Interim medical history since our last visit reviewed. Allergies and medications reviewed and updated. Outpatient Medications Prior to Visit  Medication Sig Dispense Refill  . alum & mag hydroxide-simeth (MAALOX/MYLANTA) 200-200-20 MG/5ML suspension Take 15 mLs by mouth every 6 (six) hours as needed for indigestion or heartburn.    . chlorpheniramine-HYDROcodone (TUSSIONEX PENNKINETIC ER) 10-8 MG/5ML SUER Take 5 mLs by mouth at bedtime as needed for cough. 20 mL 0  . levofloxacin (LEVAQUIN) 750 MG  tablet Take 1 tablet (750 mg total) by mouth daily for 4 days. 4 tablet 0  . losartan (COZAAR) 100 MG tablet Take 1 tablet (100 mg total) by mouth daily. 90 tablet 3  . metoprolol succinate (TOPROL-XL) 50 MG 24 hr tablet Take 1 tablet (50 mg total) by mouth daily. Take with or immediately following a meal. 90 tablet 3  . ranitidine (ZANTAC) 150 MG capsule Take 150 mg by mouth daily as needed for heartburn.     . warfarin (COUMADIN) 5 MG tablet TAKE AS DIRECTED BY  COUMADIN  CLINIC 80 tablet 0  . warfarin (COUMADIN) 5 MG tablet Take 2.5-5 mg by mouth See admin instructions. 2.5 mg Sunday, Tuesday & Wednesday. All other days- 5 mg     No facility-administered medications prior to visit.      Per HPI unless specifically indicated in ROS section below Review of Systems     Objective:    BP 122/64 (BP Location: Left Arm, Patient Position: Sitting, Cuff Size: Normal)   Pulse 73   Temp 98.2 F (36.8 C) (Oral)   Wt 169 lb 4 oz (76.8 kg)   SpO2 96%   BMI 23.28 kg/m   Wt Readings from Last 3 Encounters:  07/25/17 169 lb 4 oz (76.8 kg)  07/23/17 165 lb (74.8 kg)  07/20/17 175 lb 12 oz (79.7 kg)   HR races to 120s when he gets on exam table, then with deep breaths comes down to 70s.  Physical Exam  Constitutional: He appears well-developed and well-nourished. No  distress.  HENT:  Mouth/Throat: Oropharynx is clear and moist. No oropharyngeal exudate.  Eyes: Conjunctivae are normal. Pupils are equal, round, and reactive to light.  Cardiovascular: Normal rate, regular rhythm, normal heart sounds and intact distal pulses.  No murmur heard. Pulmonary/Chest: Effort normal. No respiratory distress. He has decreased breath sounds. He has no wheezes. He has rhonchi. He has rales (R>L basilar rales).  Abdominal: Soft. Normal appearance and bowel sounds are normal. He exhibits no distension and no mass. There is no tenderness. There is no rebound and no guarding.  Musculoskeletal: He exhibits no  edema.  Skin: Skin is warm and dry. No rash noted.  Nursing note and vitals reviewed.  Results for orders placed or performed during the hospital encounter of 07/23/17  CBC with Differential  Result Value Ref Range   WBC 18.0 (H) 4.0 - 10.5 K/uL   RBC 4.44 4.22 - 5.81 MIL/uL   Hemoglobin 12.8 (L) 13.0 - 17.0 g/dL   HCT 16.1 (L) 09.6 - 04.5 %   MCV 85.6 78.0 - 100.0 fL   MCH 28.8 26.0 - 34.0 pg   MCHC 33.7 30.0 - 36.0 g/dL   RDW 40.9 81.1 - 91.4 %   Platelets 457 (H) 150 - 400 K/uL   Neutrophils Relative % 80 %   Neutro Abs 14.4 (H) 1.7 - 7.7 K/uL   Lymphocytes Relative 9 %   Lymphs Abs 1.8 0.7 - 4.0 K/uL   Monocytes Relative 10 %   Monocytes Absolute 1.7 (H) 0.1 - 1.0 K/uL   Eosinophils Relative 1 %   Eosinophils Absolute 0.1 0.0 - 0.7 K/uL   Basophils Relative 0 %   Basophils Absolute 0.0 0.0 - 0.1 K/uL  Comprehensive metabolic panel  Result Value Ref Range   Sodium 130 (L) 135 - 145 mmol/L   Potassium 5.8 (H) 3.5 - 5.1 mmol/L   Chloride 97 (L) 101 - 111 mmol/L   CO2 21 (L) 22 - 32 mmol/L   Glucose, Bld 135 (H) 65 - 99 mg/dL   BUN 13 6 - 20 mg/dL   Creatinine, Ser 7.82 (H) 0.61 - 1.24 mg/dL   Calcium 8.8 (L) 8.9 - 10.3 mg/dL   Total Protein 6.6 6.5 - 8.1 g/dL   Albumin 3.3 (L) 3.5 - 5.0 g/dL   AST 26 15 - 41 U/L   ALT 53 17 - 63 U/L   Alkaline Phosphatase 123 38 - 126 U/L   Total Bilirubin 1.5 (H) 0.3 - 1.2 mg/dL   GFR calc non Af Amer 54 (L) >60 mL/min   GFR calc Af Amer >60 >60 mL/min   Anion gap 12 5 - 15  Lipase, blood  Result Value Ref Range   Lipase 24 11 - 51 U/L  Brain natriuretic peptide  Result Value Ref Range   B Natriuretic Peptide 473.8 (H) 0.0 - 100.0 pg/mL  Protime-INR  Result Value Ref Range   Prothrombin Time 51.9 (H) 11.4 - 15.2 seconds   INR 5.82 (HH)   Basic metabolic panel  Result Value Ref Range   Sodium 134 (L) 135 - 145 mmol/L   Potassium 5.3 (H) 3.5 - 5.1 mmol/L   Chloride 102 101 - 111 mmol/L   CO2 19 (L) 22 - 32 mmol/L    Glucose, Bld 103 (H) 65 - 99 mg/dL   BUN 13 6 - 20 mg/dL   Creatinine, Ser 9.56 0.61 - 1.24 mg/dL   Calcium 8.9 8.9 - 21.3 mg/dL   GFR calc  non Af Amer 59 (L) >60 mL/min   GFR calc Af Amer >60 >60 mL/min   Anion gap 13 5 - 15  Influenza panel by PCR (type A & B)  Result Value Ref Range   Influenza A By PCR NEGATIVE NEGATIVE   Influenza B By PCR NEGATIVE NEGATIVE  I-stat troponin, ED  Result Value Ref Range   Troponin i, poc 0.02 0.00 - 0.08 ng/mL   Comment 3          I-Stat CG4 Lactic Acid, ED  Result Value Ref Range   Lactic Acid, Venous 1.33 0.5 - 1.9 mmol/L  I-Stat CG4 Lactic Acid, ED  Result Value Ref Range   Lactic Acid, Venous 1.15 0.5 - 1.9 mmol/L      Assessment & Plan:   Problem List Items Addressed This Visit    (HFpEF) heart failure with preserved ejection fraction (HCC)    Seems euvolemic today.       CAP (community acquired pneumonia) - Primary    Treating as community acquired pneumonia with 5d levaquin 750mg  course.  Improving.  With noted hypoxia, check ambulatory pulse ox to ensure does not need supplemental O2 with exertion.  With recent supratherapeutic INR, check coumadin today and dose accordingly.       Epigastric abdominal pain    Endorses GERD sxs with indigestion, bloating, gassiness. No pain on exam. Not consistent with pancreatitis. No red flags. Will trial omeprazole 40mg  daily x 3 wks then PRN. Update with effect. Reassess at f/u visit 3 wks.       Long term current use of anticoagulant therapy    INR remaining elevated - will continue to hold coumadin, recheck in 2 days.           Follow up plan: Return in about 3 weeks (around 08/15/2017) for follow up visit.  [name], MD

## 2017-07-25 NOTE — Patient Instructions (Addendum)
  INR today 5.9  Patient has been holding dose for past 2 days due to supratherapeutic reading on Mon 1/21. Continue to hold coumadin and recheck this Friday.  07/27/17.   Patient will continue on Levaquin until finishes in 2 days.    Patient educated on risks of supratherapeutic level and will go to ER if any concerns develop.

## 2017-07-25 NOTE — Patient Instructions (Addendum)
Ambulatory pulse ox today See Mandy for coumadin check today.  Start omeprazole 40mg  daily for 3 weeks then as needed.  Return in 2-3 weeks for follow up visit, sooner if needed.

## 2017-07-26 ENCOUNTER — Telehealth: Payer: Self-pay | Admitting: Internal Medicine

## 2017-07-26 ENCOUNTER — Telehealth: Payer: Self-pay | Admitting: Family Medicine

## 2017-07-26 NOTE — Telephone Encounter (Signed)
[name] is calling because Noah Thompson States he received a flu shot on 05/14/17 when he came in for his coumadin check and is wanting to know why it does not show up ion his My Chart stating that he had it done . There is no record to show that he had a Flu Shot . Please call   Thanks

## 2017-07-26 NOTE — Telephone Encounter (Signed)
Copied from CRM [id number]. Topic: Appointment Scheduling - Scheduling Inquiry for Clinic >> Jul 26, 2017 11:30 AM Waymon Amato wrote: CRM for notification. See Telephone encounter for:  Pt called stating that he got a call yesterday to confirm the coumadin clinic and also if he would be able to schedule a wellness check and patient is can but nothing is scheduled is he talking about a AWV with [facility] Please call [phone] 07/26/17.

## 2017-07-26 NOTE — Telephone Encounter (Signed)
Returned call to wife, without any answer, therefore called pt & he started talking about being sick with PNA & taking antibiotics & having to get INR checked. Pt has been prescribed Levaquin 750mg  daily for a total of 4 tabs & PCP has been checking his INR & dosing his Coumadin.  Pt put wife on the phone as he states he was too sick & I was able to gather information from her about the pt & she states  She thinks the pt obtained the flu shot on his last appt to the [facility] on 05/14/17, advised that it can be added to his records. She verified with pt while on the phone that the pt had the flu shot on that date & stated yes as I could hear him while on the phone with her. Advised that it would be added to report for their records as they are certain the pt had the Immunization on 05/14/17.

## 2017-07-26 NOTE — Telephone Encounter (Signed)
Pt scheduled for tomorrow 07/27/17. Pt aware

## 2017-07-26 NOTE — Telephone Encounter (Signed)
Revonda Standard can you help with medicare wellness appointment

## 2017-07-27 ENCOUNTER — Ambulatory Visit (INDEPENDENT_AMBULATORY_CARE_PROVIDER_SITE_OTHER): Payer: Medicare Other

## 2017-07-27 ENCOUNTER — Telehealth: Payer: Self-pay

## 2017-07-27 VITALS — BP 98/70 | HR 89 | Temp 97.6°F | Ht 69.0 in | Wt 164.5 lb

## 2017-07-27 DIAGNOSIS — Z0000 Encounter for general adult medical examination without abnormal findings: Secondary | ICD-10-CM

## 2017-07-27 DIAGNOSIS — Z5181 Encounter for therapeutic drug level monitoring: Secondary | ICD-10-CM

## 2017-07-27 DIAGNOSIS — Z7901 Long term (current) use of anticoagulants: Secondary | ICD-10-CM | POA: Insufficient documentation

## 2017-07-27 LAB — POCT INR: INR: 2.1

## 2017-07-27 NOTE — Patient Instructions (Addendum)
INR today 2.1  Therapeutic range 2.5 - 3.5   Patient has been holding dose for past 4 days due to supratherapeutic readings this week.  Patient is taking his last dose of Levaquin today.   Patient is to resume prior dosing schedule today of 5mg  Mon, thurs, Sat and 2.5mg  on Sun, Tues, Wed, Fri.  And Recheck in 1 week.  He will continue his routine bi-weekly spinach/green consumption.    Patient educated on risks associated with a subtherapeutic level and will go to ER if any concerns.

## 2017-07-27 NOTE — Telephone Encounter (Signed)
It's OK for pt to switch his Coumadin management to [facility].  Tell Findley Ewton we will miss him.  Thanks [employer]

## 2017-07-27 NOTE — Patient Instructions (Signed)
Noah Thompson , Thank you for taking time to come for your Medicare Wellness Visit. I appreciate your ongoing commitment to your health goals. Please review the following plan we discussed and let me know if I can assist you in the future.   These are the goals we discussed: Goals    . DIET - INCREASE WATER INTAKE     Starting 07/27/2017, I will attempt to drink at least 6-8 glasses of water daily.        This is a list of the screening recommended for you and due dates:  Health Maintenance  Topic Date Due  . DTaP/Tdap/Td vaccine (1 - Tdap) 07/17/2018*  . Tetanus Vaccine  07/27/2018*  . Pneumonia vaccines (2 of 2 - PPSV23) 10/01/2018*  . Flu Shot  Completed  *Topic was postponed. The date shown is not the original due date.   Preventive Care for Adults  A healthy lifestyle and preventive care can promote health and wellness. Preventive health guidelines for adults include the following key practices.  . A routine yearly physical is a good way to check with your health care provider about your health and preventive screening. It is a chance to share any concerns and updates on your health and to receive a thorough exam.  . Visit your dentist for a routine exam and preventive care every 6 months. Brush your teeth twice a day and floss once a day. Good oral hygiene prevents tooth decay and gum disease.  . The frequency of eye exams is based on your age, health, family medical history, use  of contact lenses, and other factors. Follow your health care provider's recommendations for frequency of eye exams.  . Eat a healthy diet. Foods like vegetables, fruits, whole grains, low-fat dairy products, and lean protein foods contain the nutrients you need without too many calories. Decrease your intake of foods high in solid fats, added sugars, and salt. Eat the right amount of calories for you. Get information about a proper diet from your health care provider, if necessary.  . Regular physical  exercise is one of the most important things you can do for your health. Most adults should get at least 150 minutes of moderate-intensity exercise (any activity that increases your heart rate and causes you to sweat) each week. In addition, most adults need muscle-strengthening exercises on 2 or more days a week.  Silver Sneakers may be a benefit available to you. To determine eligibility, you may visit the website: www.silversneakers.com or contact program at (343) 171-4127 Mon-Fri between 8AM-8PM.   . Maintain a healthy weight. The body mass index (BMI) is a screening tool to identify possible weight problems. It provides an estimate of body fat based on height and weight. Your health care provider can find your BMI and can help you achieve or maintain a healthy weight.   For adults 20 years and older: ? A BMI below 18.5 is considered underweight. ? A BMI of 18.5 to 24.9 is normal. ? A BMI of 25 to 29.9 is considered overweight. ? A BMI of 30 and above is considered obese.   . Maintain normal blood lipids and cholesterol levels by exercising and minimizing your intake of saturated fat. Eat a balanced diet with plenty of fruit and vegetables. Blood tests for lipids and cholesterol should begin at age 58 and be repeated every 5 years. If your lipid or cholesterol levels are high, you are over 50, or you are at high risk for heart disease, you  may need your cholesterol levels checked more frequently. Ongoing high lipid and cholesterol levels should be treated with medicines if diet and exercise are not working.  . If you smoke, find out from your health care provider how to quit. If you do not use tobacco, please do not start.  . If you choose to drink alcohol, please do not consume more than 2 drinks per day. One drink is considered to be 12 ounces (355 mL) of beer, 5 ounces (148 mL) of wine, or 1.5 ounces (44 mL) of liquor.  . If you are 42-30 years old, ask your health care provider if you  should take aspirin to prevent strokes.  . Use sunscreen. Apply sunscreen liberally and repeatedly throughout the day. You should seek shade when your shadow is shorter than you. Protect yourself by wearing long sleeves, pants, a wide-brimmed hat, and sunglasses year round, whenever you are outdoors.  . Once a month, do a whole body skin exam, using a mirror to look at the skin on your back. Tell your health care provider of new moles, moles that have irregular borders, moles that are larger than a pencil eraser, or moles that have changed in shape or color.

## 2017-07-27 NOTE — Progress Notes (Signed)
Pre visit review using our clinic review tool, if applicable. No additional management support is needed unless otherwise documented below in the visit note. 

## 2017-07-27 NOTE — Progress Notes (Signed)
Subjective:   Noah Thompson is a 77 y.o. male who presents for Medicare Annual/Subsequent preventive examination.  Review of Systems:  N/A Cardiac Risk Factors include: advanced age (>70men, >55 women);male gender;dyslipidemia;hypertension     Objective:    Vitals: BP 98/70 (BP Location: Right Arm, Patient Position: Sitting, Cuff Size: Normal)   Pulse 89   Temp 97.6 F (36.4 C) (Oral)   Ht 5\' 9"  (1.753 m) Comment: no shoes  Wt 164 lb 8 oz (74.6 kg)   SpO2 92%   BMI 24.29 kg/m   Body mass index is 24.29 kg/m.  Advanced Directives 07/27/2017 09/17/2014 01/25/2012  Does Patient Have a Medical Advance Directive? No No Patient does not have advance directive  Would patient like information on creating a medical advance directive? No - Patient declined Yes - Transport planner given -  Pre-existing out of facility DNR order (yellow form or pink MOST form) - - No    Tobacco Social History   Tobacco Use  Smoking Status Never Smoker  Smokeless Tobacco Never Used     Counseling given: No   Clinical Intake:  Pre-visit preparation completed: Yes  Pain : No/denies pain Pain Score: 0-No pain     Nutritional Status: BMI of 19-24  Normal Nutritional Risks: None Diabetes: No  How often do you need to have someone help you when you read instructions, pamphlets, or other written materials from your doctor or pharmacy?: 1 - Never What is the last grade level you completed in school?: 11th grade  Interpreter Needed?: No  Comments: pt lives with spouse Information entered by :: LPinson, LPN  Past Medical History:  Diagnosis Date  . Cataract    left eye  . CHF (congestive heart failure) (HCC)   . Complete heart block (HCC)    a. 1993 s/p PPM 2/2 perioperative heart block;  b. 01/2012 s/p SJM Accent DC PPM upgrade.  . Depression   . GERD (gastroesophageal reflux disease)   . History of nephritis    a. as child.  Marland Kitchen History of pneumonia   . Hyperlipidemia   .  Hypertension   . NICM (nonischemic cardiomyopathy) (HCC)    a. 06/2012 Echo: EF 45-50%;  b. 08/2013 Echo: EF 20-25%;  c. 10/2013 Adenosine MV: Fixed septal defect likely representing LBBB-related artifact, no reversible ischemia, EF 43%, low risk.  . Paroxysmal atrial tachycardia (HCC)    a. 07/2013 noted on device interrogation.  . Presence of permanent cardiac pacemaker 2013  . RA (rheumatoid arthritis) (HCC)   . S/P aortic valve replacement    a. 1993 s/p SJM mechanical AVR for aortic stenosis - chronic coumadin.  Marland Kitchen Shortness of breath dyspnea    Past Surgical History:  Procedure Laterality Date  . ANAL FISSURE REPAIR  1970s  . AORTIC VALVE REPLACEMENT  1993   St. Jude Mechanical Valve  . INSERT / REPLACE / REMOVE PACEMAKER    . PACEMAKER INSERTION  1993   for AV block, not pacemaker dependant  . PERMANENT PACEMAKER GENERATOR CHANGE N/A 01/25/2012   Procedure: PERMANENT PACEMAKER GENERATOR CHANGE;  Surgeon: [name], MD;  Location: [facility] CATH LAB;  Service: Cardiovascular;  Laterality: N/A;   Family History  Problem Relation Age of Onset  . Arthritis Mother   . Hypertension Mother   . Arthritis Father   . Heart disease Father   . Prostate cancer Father 54  . Hypertension Father   . Congestive Heart Failure Father   . Coronary artery disease  Neg Hx   . Stroke Neg Hx   . Diabetes Neg Hx    Social History   Socioeconomic History  . Marital status: Married    Spouse name: None  . Number of children: 1  . Years of education: None  . Highest education level: None  Social Needs  . Financial resource strain: None  . Food insecurity - worry: None  . Food insecurity - inability: None  . Transportation needs - medical: None  . Transportation needs - non-medical: None  Occupational History  . Occupation: retired  Tobacco Use  . Smoking status: Never Smoker  . Smokeless tobacco: Never Used  Substance and Sexual Activity  . Alcohol use: No  . Drug use: No  . Sexual  activity: Not Currently  Other Topics Concern  . None  Social History Narrative   Lives with wife, 5 dogs.  grown children away.   Occupation: retired, was Teaching laboratory technician   Activity: welding   Diet: low sodium diet, good water, fruits/vegetables daily    Outpatient Encounter Medications as of 07/27/2017  Medication Sig  . alum & mag hydroxide-simeth (MAALOX/MYLANTA) 200-200-20 MG/5ML suspension Take 15 mLs by mouth every 6 (six) hours as needed for indigestion or heartburn.  . chlorpheniramine-HYDROcodone (TUSSIONEX PENNKINETIC ER) 10-8 MG/5ML SUER Take 5 mLs by mouth at bedtime as needed for cough.  Marland Kitchen levofloxacin (LEVAQUIN) 750 MG tablet Take 1 tablet (750 mg total) by mouth daily for 4 days.  Marland Kitchen losartan (COZAAR) 100 MG tablet Take 1 tablet (100 mg total) by mouth daily.  . metoprolol succinate (TOPROL-XL) 50 MG 24 hr tablet Take 1 tablet (50 mg total) by mouth daily. Take with or immediately following a meal.  . omeprazole (PRILOSEC) 40 MG capsule Take 1 capsule (40 mg total) by mouth daily. Take daily for 3 days then as needed  . ranitidine (ZANTAC) 150 MG capsule Take 150 mg by mouth daily as needed for heartburn.   . warfarin (COUMADIN) 5 MG tablet TAKE AS DIRECTED BY  COUMADIN  CLINIC  . warfarin (COUMADIN) 5 MG tablet Take 2.5-5 mg by mouth See admin instructions. 2.5 mg Sunday, Tuesday & Wednesday. All other days- 5 mg   No facility-administered encounter medications on file as of 07/27/2017.     Activities of Daily Living In your present state of health, do you have any difficulty performing the following activities: 07/27/2017  Hearing? N  Vision? Y  Comment vision loss in left eye due to cataract  Difficulty concentrating or making decisions? N  Walking or climbing stairs? Y  Dressing or bathing? N  Doing errands, shopping? N  Preparing Food and eating ? N  Using the Toilet? N  In the past six months, have you accidently leaked urine? N  Do you have problems with loss of  bowel control? N  Managing your Medications? N  Managing your Finances? N  Housekeeping or managing your Housekeeping? N  Some recent data might be hidden    Patient Care Team: [name], MD as PCP - General (Family Medicine)   Assessment:   This is a routine wellness examination for Noah Thompson.   Hearing Screening   125Hz  250Hz  500Hz  1000Hz  2000Hz  3000Hz  4000Hz  6000Hz  8000Hz   Right ear:   0 0 40  0    Left ear:   0 0 40  0      Visual Acuity Screening   Right eye Left eye Both eyes  Without correction:     With  correction: 20/25-2 0 20/25     Exercise Activities and Dietary recommendations Current Exercise Habits: The patient does not participate in regular exercise at present, Exercise limited by: None identified  Goals    . DIET - INCREASE WATER INTAKE     Starting 07/27/2017, I will attempt to drink at least 6-8 glasses of water daily.        Fall Risk Fall Risk  07/27/2017 02/16/2016  Falls in the past year? No No   Depression Screen PHQ 2/9 Scores 07/27/2017 02/16/2016  PHQ - 2 Score 0 0  PHQ- 9 Score 0 -    Cognitive Function MMSE - Mini Mental State Exam 07/27/2017  Orientation to time 5  Orientation to Place 5  Registration 3  Attention/ Calculation 0  Recall 3  Language- name 2 objects 0  Language- repeat 1  Language- follow 3 step command 3  Language- read & follow direction 0  Write a sentence 0  Copy design 0  Total score 20       PLEASE NOTE: A Mini-Cog screen was completed. Maximum score is 20. A value of 0 denotes this part of Folstein MMSE was not completed or the patient failed this part of the Mini-Cog screening.   Mini-Cog Screening Orientation to Time - Max 5 pts Orientation to Place - Max 5 pts Registration - Max 3 pts Recall - Max 3 pts Language Repeat - Max 1 pts Language Follow 3 Step Command - Max 3 pts   Immunization History  Administered Date(s) Administered  . Influenza Whole 05/08/2005, 05/12/2009, 05/05/2010  .  Influenza, Seasonal, Injecte, Preservative Fre 07/05/2012  . Influenza,inj,Quad PF,6+ Mos 07/24/2013, 04/15/2014, 06/06/2016  . Pneumococcal Conjugate-13 02/16/2016    Screening Tests Health Maintenance  Topic Date Due  . DTaP/Tdap/Td (1 - Tdap) 07/17/2018 (Originally 12/25/1959)  . TETANUS/TDAP  07/27/2018 (Originally 12/25/1959)  . PNA vac Low Risk Adult (2 of 2 - PPSV23) 10/01/2018 (Originally 02/15/2017)  . INFLUENZA VACCINE  Completed     Plan:     I have personally reviewed, addressed, and noted the following in the patient's chart:  A. Medical and social history B. Use of alcohol, tobacco or illicit drugs  C. Current medications and supplements D. Functional ability and status E.  Nutritional status F.  Physical activity G. Advance directives H. List of other physicians I.  Hospitalizations, surgeries, and ER visits in previous 12 months J.  Vitals K. Screenings to include hearing, vision, cognitive, depression L. Referrals and appointments - none  In addition, I have reviewed and discussed with patient certain preventive protocols, quality metrics, and best practice recommendations. A written personalized care plan for preventive services as well as general preventive health recommendations were provided to patient.  See attached scanned questionnaire for additional information.   Signed,   Randa Evens, MHA, BS, LPN Health Coach

## 2017-07-27 NOTE — Progress Notes (Signed)
PCP notes:   Health maintenance:  PPSV23 - postponed/pt is ill today Tetanus vaccine - postponed/insurance   Abnormal screenings:   Hearing - failed  Hearing Screening   125Hz  250Hz  500Hz  1000Hz  2000Hz  3000Hz  4000Hz  6000Hz  8000Hz   Right ear:   0 0 40  0    Left ear:   0 0 40  0      Patient concerns:   Patient is still recovering from pneumonia.   Nurse concerns:  Patient's BP is 98/70 without medication. Advised PCP. Per PCP instruction, patient was advised to hold Losartan today. Patient was advised to take BP prior to taking any cardiac medication and to hold Losartan if systolic is below 100 and/or if diastolic is below 60. Patient is to contact PCP on 07/30/17 to provide update about BP. Patient will keep a daily log of BP and present readings to PCP on next appt. Patient and spouse verbalized understanding.  Next PCP appt:   08/03/17 @ 1030

## 2017-07-27 NOTE — Telephone Encounter (Signed)
Patient has requested (due to convenience) to be transferred to our coumadin clinic here at [facility].  I have been following him this week after his recent hospital admission and he will see me again next week.  Request permission from cardiology for [place] to transfer his ongoing coumadin clinic care to our clinic here at [facility]?

## 2017-07-29 NOTE — Progress Notes (Signed)
I reviewed health advisor's note, was available for consultation, and agree with documentation and plan.  

## 2017-08-01 ENCOUNTER — Ambulatory Visit (INDEPENDENT_AMBULATORY_CARE_PROVIDER_SITE_OTHER): Payer: Medicare Other | Admitting: *Deleted

## 2017-08-01 DIAGNOSIS — I441 Atrioventricular block, second degree: Secondary | ICD-10-CM

## 2017-08-01 NOTE — Progress Notes (Signed)
Remote pacemaker transmission.   

## 2017-08-02 ENCOUNTER — Encounter: Payer: Self-pay | Admitting: Cardiology

## 2017-08-03 ENCOUNTER — Encounter: Payer: Self-pay | Admitting: Family Medicine

## 2017-08-03 ENCOUNTER — Ambulatory Visit (INDEPENDENT_AMBULATORY_CARE_PROVIDER_SITE_OTHER): Payer: Medicare Other | Admitting: Family Medicine

## 2017-08-03 ENCOUNTER — Ambulatory Visit (INDEPENDENT_AMBULATORY_CARE_PROVIDER_SITE_OTHER): Payer: Medicare Other

## 2017-08-03 VITALS — BP 128/68 | HR 68 | Temp 97.9°F | Ht 69.0 in | Wt 165.0 lb

## 2017-08-03 DIAGNOSIS — Z0000 Encounter for general adult medical examination without abnormal findings: Secondary | ICD-10-CM

## 2017-08-03 DIAGNOSIS — I10 Essential (primary) hypertension: Secondary | ICD-10-CM | POA: Diagnosis not present

## 2017-08-03 DIAGNOSIS — Z950 Presence of cardiac pacemaker: Secondary | ICD-10-CM

## 2017-08-03 DIAGNOSIS — Z0001 Encounter for general adult medical examination with abnormal findings: Secondary | ICD-10-CM | POA: Diagnosis not present

## 2017-08-03 DIAGNOSIS — Z7901 Long term (current) use of anticoagulants: Secondary | ICD-10-CM

## 2017-08-03 DIAGNOSIS — Z952 Presence of prosthetic heart valve: Secondary | ICD-10-CM

## 2017-08-03 DIAGNOSIS — E785 Hyperlipidemia, unspecified: Secondary | ICD-10-CM | POA: Diagnosis not present

## 2017-08-03 DIAGNOSIS — I080 Rheumatic disorders of both mitral and aortic valves: Secondary | ICD-10-CM

## 2017-08-03 DIAGNOSIS — I428 Other cardiomyopathies: Secondary | ICD-10-CM | POA: Diagnosis not present

## 2017-08-03 DIAGNOSIS — I5030 Unspecified diastolic (congestive) heart failure: Secondary | ICD-10-CM | POA: Diagnosis not present

## 2017-08-03 DIAGNOSIS — Z7189 Other specified counseling: Secondary | ICD-10-CM

## 2017-08-03 DIAGNOSIS — K219 Gastro-esophageal reflux disease without esophagitis: Secondary | ICD-10-CM

## 2017-08-03 DIAGNOSIS — J189 Pneumonia, unspecified organism: Secondary | ICD-10-CM

## 2017-08-03 DIAGNOSIS — Z8042 Family history of malignant neoplasm of prostate: Secondary | ICD-10-CM

## 2017-08-03 DIAGNOSIS — Z125 Encounter for screening for malignant neoplasm of prostate: Secondary | ICD-10-CM

## 2017-08-03 LAB — LIPID PANEL
CHOL/HDL RATIO: 4
Cholesterol: 162 mg/dL (ref 0–200)
HDL: 39.7 mg/dL (ref >39.00)
LDL CALC: 88 mg/dL (ref 0–99)
NONHDL: 122.74
Triglycerides: 173 mg/dL — ABNORMAL HIGH (ref 0.0–149.0)
VLDL: 34.6 mg/dL (ref 0.0–40.0)

## 2017-08-03 LAB — PSA: PSA: 5 ng/mL — AB (ref 0.10–4.00)

## 2017-08-03 LAB — POCT INR: INR: 2.9

## 2017-08-03 MED ORDER — LOSARTAN POTASSIUM 50 MG PO TABS: 50.0000 mg | ORAL_TABLET | Freq: Every day | ORAL | 3 refills | Status: DC

## 2017-08-03 NOTE — Assessment & Plan Note (Signed)
Seems euvolemic today.  

## 2017-08-03 NOTE — Progress Notes (Signed)
BP 128/68 (BP Location: Left Arm, Patient Position: Sitting, Cuff Size: Normal)   Pulse 68   Temp 97.9 F (36.6 C) (Oral)   Ht 5\' 9"  (1.753 m)   Wt 165 lb (74.8 kg)   SpO2 97%   BMI 24.37 kg/m    CC: CPE Subjective:    Patient ID: Noah Thompson, male    DOB: July 26, 1940, 77 y.o.   MRN: [id number]  HPI: Noah Thompson is a 77 y.o. male presenting on 08/03/2017 for Annual Exam (Pt 2.  States he feels a lot better.)   Saw Lesia last week for medicare wellness visit. Note reviewed. BP was low at that time to 98/70 - losartan was held. He feels better, has had to hold losartan several days . He also failed hearing screen.   Recent ER evaluation with possible atypical infection treated with levaquin 750mg  5d course. Exertional dyspnea is improving - he notes improvement as he is more active.   Recent epigastric discomfort with indigestion, bloating, GERD - last visit we started omeprazole 40mg  daily x 3 wks then PRN. This has helped.  Coumadin - due for INR check today.   Preventative: Colon cancer screening - cologuard negative 05/2016 Prostate cancer screening - previously normal at [facility]. rpt today given fmhx (father age 20) Lung cancer screening - never smoker Flu shot yearly  Tetanus shot - ?declines Pneumonia shot - at [place] likely pneumovax. Prevnar 02/2016.  shingrix - discussed, declines.  Advanced directive discussion - has not set up. Would want wife to be HCPOA. Packet provided previously.  Seat belt use discussed Sunscreen use discussed. No changing moles on skin.  Non smoker Alcohol - none  Lives with wife, 5 dogs. grown children away. Occupation: retired, was Teaching laboratory technician Activity: welding  Diet: low sodium diet, good water, fruits/vegetables daily  Relevant past medical, surgical, family and social history reviewed and updated as indicated. Interim medical history since our last visit reviewed. Allergies and medications reviewed and updated. Outpatient  Medications Prior to Visit  Medication Sig Dispense Refill  . alum & mag hydroxide-simeth (MAALOX/MYLANTA) 200-200-20 MG/5ML suspension Take 15 mLs by mouth every 6 (six) hours as needed for indigestion or heartburn.    . chlorpheniramine-HYDROcodone (TUSSIONEX PENNKINETIC ER) 10-8 MG/5ML SUER Take 5 mLs by mouth at bedtime as needed for cough. 20 mL 0  . metoprolol succinate (TOPROL-XL) 50 MG 24 hr tablet Take 1 tablet (50 mg total) by mouth daily. Take with or immediately following a meal. 90 tablet 3  . omeprazole (PRILOSEC) 40 MG capsule Take 1 capsule (40 mg total) by mouth daily. Take daily for 3 days then as needed 30 capsule 1  . ranitidine (ZANTAC) 150 MG capsule Take 150 mg by mouth daily as needed for heartburn.     . warfarin (COUMADIN) 5 MG tablet TAKE AS DIRECTED BY  COUMADIN  CLINIC 80 tablet 0  . warfarin (COUMADIN) 5 MG tablet Take 2.5-5 mg by mouth See admin instructions. 2.5 mg Sunday, Tuesday & Wednesday. All other days- 5 mg    . losartan (COZAAR) 100 MG tablet Take 1 tablet (100 mg total) by mouth daily. 90 tablet 3   No facility-administered medications prior to visit.      Per HPI unless specifically indicated in ROS section below Review of Systems  Constitutional: Negative for activity change, appetite change, chills, fatigue, fever and unexpected weight change.  HENT: Positive for congestion. Negative for hearing loss.   Eyes: Negative for visual  disturbance.  Respiratory: Positive for cough, shortness of breath and wheezing. Negative for chest tightness.        Recent PNA  Cardiovascular: Negative for chest pain, palpitations and leg swelling.  Gastrointestinal: Negative for abdominal distention, abdominal pain, blood in stool, constipation, diarrhea, nausea and vomiting.  Genitourinary: Negative for difficulty urinating and hematuria.  Musculoskeletal: Negative for arthralgias, myalgias and neck pain.  Skin: Negative for rash.  Neurological: Positive for  dizziness. Negative for seizures, syncope and headaches.  Hematological: Negative for adenopathy. Bruises/bleeds easily.  Psychiatric/Behavioral: Negative for dysphoric mood. The patient is not nervous/anxious.        Objective:    BP 128/68 (BP Location: Left Arm, Patient Position: Sitting, Cuff Size: Normal)   Pulse 68   Temp 97.9 F (36.6 C) (Oral)   Ht 5\' 9"  (1.753 m)   Wt 165 lb (74.8 kg)   SpO2 97%   BMI 24.37 kg/m   Wt Readings from Last 3 Encounters:  08/03/17 165 lb (74.8 kg)  07/27/17 164 lb 8 oz (74.6 kg)  07/25/17 169 lb 4 oz (76.8 kg)    Physical Exam  Constitutional: He is oriented to person, place, and time. He appears well-developed and well-nourished. No distress.  HENT:  Head: Normocephalic and atraumatic.  Right Ear: Hearing, tympanic membrane, external ear and ear canal normal.  Left Ear: Hearing, tympanic membrane, external ear and ear canal normal.  Nose: Nose normal.  Mouth/Throat: Uvula is midline, oropharynx is clear and moist and mucous membranes are normal. No oropharyngeal exudate, posterior oropharyngeal edema or posterior oropharyngeal erythema.  Eyes: Conjunctivae and EOM are normal. Pupils are equal, round, and reactive to light. No scleral icterus.  Neck: Normal range of motion. Neck supple. Carotid bruit is not present. No thyromegaly present.  Cardiovascular: Normal rate, regular rhythm, normal heart sounds and intact distal pulses.  No murmur heard. Pulses:      Radial pulses are 2+ on the right side, and 2+ on the left side.  Pulmonary/Chest: Effort normal and breath sounds normal. No respiratory distress. He has no wheezes. He has no rales.  Abdominal: Soft. Bowel sounds are normal. He exhibits no distension and no mass. There is no tenderness. There is no rebound and no guarding.  Genitourinary: Rectum normal. Rectal exam shows no external hemorrhoid, no fissure, no mass, no tenderness and anal tone normal. Prostate is enlarged (30gm).  Prostate is not tender.  Musculoskeletal: Normal range of motion. He exhibits no edema.  Lymphadenopathy:    He has no cervical adenopathy.  Neurological: He is alert and oriented to person, place, and time.  CN grossly intact, station and gait intact  Skin: Skin is warm and dry. No rash noted.  Psychiatric: He has a normal mood and affect. His behavior is normal. Judgment and thought content normal.  Nursing note and vitals reviewed.  Results for orders placed or performed in visit on 07/27/17  POCT INR  Result Value Ref Range   INR 2.1    Lab Results  Component Value Date   PSA 1.99 12/14/2011   PSA 1.46 05/05/2010   PSA 1.46 05/12/2009      Assessment & Plan:   Problem List Items Addressed This Visit    (HFpEF) heart failure with preserved ejection fraction (HCC)   Relevant Medications   losartan (COZAAR) 50 MG tablet   Advanced care planning/counseling discussion    Advanced directive discussion - has not set up. Would want wife to be HCPOA. Packet provided  previously.       RESOLVED: CAP (community acquired pneumonia)    Continues recovering well.       Essential hypertension, benign    Chronic, some hypotensive readings so will decrease losartan to 50mg  daily - update with effect. Continue toprol XL 50mg  daily. RTC 6 mo f/u visit.      Relevant Medications   losartan (COZAAR) 50 MG tablet   Family history of prostate cancer   Relevant Orders   PSA   GERD (gastroesophageal reflux disease)    Better on omeprazole course.       Health maintenance examination - Primary    Preventative protocols reviewed and updated unless pt declined. Discussed healthy diet and lifestyle.       HLD (hyperlipidemia)    Chronic. Off statin. Update FLP. The 10-year ASCVD risk score Denman George DC Montez Hageman., et al., 2013) is: 31.3%   Values used to calculate the score:     Age: 62 years     Sex: Male     Is Non-Hispanic African American: No     Diabetic: No     Tobacco smoker: No      Systolic Blood Pressure: 128 mmHg     Is BP treated: Yes     HDL Cholesterol: 53.7 mg/dL     Total Cholesterol: 252 mg/dL       Relevant Medications   losartan (COZAAR) 50 MG tablet   Other Relevant Orders   Lipid panel   Long term current use of anticoagulant therapy    Will see Mandy for coumadin check today.       Mitral regurgitation and aortic stenosis   Relevant Medications   losartan (COZAAR) 50 MG tablet   Nonischemic cardiomyopathy (HCC)    Seems euvolemic today.       Relevant Medications   losartan (COZAAR) 50 MG tablet   PACEMAKER-St.Jude    Routinely monitored by EP      S/P aortic valve replacement    Continue coumadin.       Other Visit Diagnoses    Special screening for malignant neoplasm of prostate       Relevant Orders   PSA       Follow up plan: Return in about 6 months (around 01/31/2018) for follow up visit.  [name], MD

## 2017-08-03 NOTE — Assessment & Plan Note (Signed)
Chronic. Off statin. Update FLP. The 10-year ASCVD risk score Denman George DC Montez Hageman., et al., 2013) is: 31.3%   Values used to calculate the score:     Age: 77 years     Sex: Male     Is Non-Hispanic African American: No     Diabetic: No     Tobacco smoker: No     Systolic Blood Pressure: 128 mmHg     Is BP treated: Yes     HDL Cholesterol: 53.7 mg/dL     Total Cholesterol: 252 mg/dL

## 2017-08-03 NOTE — Patient Instructions (Signed)
INR today 2.9    therapeutic range 2.5 - 3.5.  Continue taking 5mg  (1 pill) on Mondays, Thursdays and Saturdays and 2.5mg  (1/2pill) on Sun, Tues, Wed, Fri.  Recheck in 2 weeks.  He will continue his normal bi-weekly spinach green consumption.    Patient is doing well and has recovered from recent pneumonia infection.  He does report decrease in his Losartan medication today by provider.  Will recheck patient in 2 weeks due to recent illness and medication changes before resuming q 4-6 week checks.  Patient denies any unusual bruising or bleeding.

## 2017-08-03 NOTE — Patient Instructions (Addendum)
Labs today. See Angelica Chessman on your way out today.  Decrease losartan dose to 50mg  daily - and update me with effect.  Work on Forensic scientist.  You are doing well today.   Health Maintenance, Male A healthy lifestyle and preventive care is important for your health and wellness. Ask your health care provider about what schedule of regular examinations is right for you. What should I know about weight and diet? Eat a Healthy Diet  Eat plenty of vegetables, fruits, whole grains, low-fat dairy products, and lean protein.  Do not eat a lot of foods high in solid fats, added sugars, or salt.  Maintain a Healthy Weight Regular exercise can help you achieve or maintain a healthy weight. You should:  Do at least 150 minutes of exercise each week. The exercise should increase your heart rate and make you sweat (moderate-intensity exercise).  Do strength-training exercises at least twice a week.  Watch Your Levels of Cholesterol and Blood Lipids  Have your blood tested for lipids and cholesterol every 5 years starting at 77 years of age. If you are at high risk for heart disease, you should start having your blood tested when you are 77 years old. You may need to have your cholesterol levels checked more often if: ? Your lipid or cholesterol levels are high. ? You are older than 77 years of age. ? You are at high risk for heart disease.  What should I know about cancer screening? Many types of cancers can be detected early and may often be prevented. Lung Cancer  You should be screened every year for lung cancer if: ? You are a current smoker who has smoked for at least 30 years. ? You are a former smoker who has quit within the past 15 years.  Talk to your health care provider about your screening options, when you should start screening, and how often you should be screened.  Colorectal Cancer  Routine colorectal cancer screening usually begins at 77 years of age and should be  repeated every 5-10 years until you are 77 years old. You may need to be screened more often if early forms of precancerous polyps or small growths are found. Your health care provider may recommend screening at an earlier age if you have risk factors for colon cancer.  Your health care provider may recommend using home test kits to check for hidden blood in the stool.  A small camera at the end of a tube can be used to examine your colon (sigmoidoscopy or colonoscopy). This checks for the earliest forms of colorectal cancer.  Prostate and Testicular Cancer  Depending on your age and overall health, your health care provider may do certain tests to screen for prostate and testicular cancer.  Talk to your health care provider about any symptoms or concerns you have about testicular or prostate cancer.  Skin Cancer  Check your skin from head to toe regularly.  Tell your health care provider about any new moles or changes in moles, especially if: ? There is a change in a mole's size, shape, or color. ? You have a mole that is larger than a pencil eraser.  Always use sunscreen. Apply sunscreen liberally and repeat throughout the day.  Protect yourself by wearing long sleeves, pants, a wide-brimmed hat, and sunglasses when outside.  What should I know about heart disease, diabetes, and high blood pressure?  If you are 37-77 years of age, have your blood pressure checked every 3-5  years. If you are 92 years of age or older, have your blood pressure checked every year. You should have your blood pressure measured twice-once when you are at a hospital or clinic, and once when you are not at a hospital or clinic. Record the average of the two measurements. To check your blood pressure when you are not at a hospital or clinic, you can use: ? An automated blood pressure machine at a pharmacy. ? A home blood pressure monitor.  Talk to your health care provider about your target blood  pressure.  If you are between 67-68 years old, ask your health care provider if you should take aspirin to prevent heart disease.  Have regular diabetes screenings by checking your fasting blood sugar level. ? If you are at a normal weight and have a low risk for diabetes, have this test once every three years after the age of 66. ? If you are overweight and have a high risk for diabetes, consider being tested at a younger age or more often.  A one-time screening for abdominal aortic aneurysm (AAA) by ultrasound is recommended for men aged 69-75 years who are current or former smokers. What should I know about preventing infection? Hepatitis B If you have a higher risk for hepatitis B, you should be screened for this virus. Talk with your health care provider to find out if you are at risk for hepatitis B infection. Hepatitis C Blood testing is recommended for:  Everyone born from [birth year] through 1965.  Anyone with known risk factors for hepatitis C.  Sexually Transmitted Diseases (STDs)  You should be screened each year for STDs including gonorrhea and chlamydia if: ? You are sexually active and are younger than 77 years of age. ? You are older than 77 years of age and your health care provider tells you that you are at risk for this type of infection. ? Your sexual activity has changed since you were last screened and you are at an increased risk for chlamydia or gonorrhea. Ask your health care provider if you are at risk.  Talk with your health care provider about whether you are at high risk of being infected with HIV. Your health care provider may recommend a prescription medicine to help prevent HIV infection.  What else can I do?  Schedule regular health, dental, and eye exams.  Stay current with your vaccines (immunizations).  Do not use any tobacco products, such as cigarettes, chewing tobacco, and e-cigarettes. If you need help quitting, ask your health care  provider.  Limit alcohol intake to no more than 2 drinks per day. One drink equals 12 ounces of beer, 5 ounces of wine, or 1 ounces of hard liquor.  Do not use street drugs.  Do not share needles.  Ask your health care provider for help if you need support or information about quitting drugs.  Tell your health care provider if you often feel depressed.  Tell your health care provider if you have ever been abused or do not feel safe at home. This information is not intended to replace advice given to you by your health care provider. Make sure you discuss any questions you have with your health care provider. Document Released: 12/16/2007 Document Revised: 02/16/2016 Document Reviewed: 03/23/2015 Elsevier Interactive Patient Education  [employer].

## 2017-08-03 NOTE — Assessment & Plan Note (Signed)
Better on omeprazole course.

## 2017-08-03 NOTE — Assessment & Plan Note (Signed)
Continues recovering well.  

## 2017-08-03 NOTE — Assessment & Plan Note (Signed)
Will see Angelica Chessman for coumadin check today.

## 2017-08-03 NOTE — Assessment & Plan Note (Signed)
Advanced directive discussion -has not set up. Would want wife to be HCPOA. Packet provided previously  

## 2017-08-03 NOTE — Assessment & Plan Note (Signed)
Routinely monitored by EP

## 2017-08-03 NOTE — Assessment & Plan Note (Signed)
Continue coumadin.  

## 2017-08-03 NOTE — Assessment & Plan Note (Signed)
Preventative protocols reviewed and updated unless pt declined. Discussed healthy diet and lifestyle.  

## 2017-08-03 NOTE — Assessment & Plan Note (Addendum)
Chronic, some hypotensive readings so will decrease losartan to 50mg  daily - update with effect. Continue toprol XL 50mg  daily. RTC 6 mo f/u visit.

## 2017-08-04 ENCOUNTER — Other Ambulatory Visit: Payer: Self-pay | Admitting: Family Medicine

## 2017-08-04 DIAGNOSIS — R972 Elevated prostate specific antigen [PSA]: Secondary | ICD-10-CM

## 2017-08-16 ENCOUNTER — Ambulatory Visit: Payer: Medicare Other

## 2017-08-16 ENCOUNTER — Ambulatory Visit (INDEPENDENT_AMBULATORY_CARE_PROVIDER_SITE_OTHER): Payer: Medicare Other | Admitting: General Practice

## 2017-08-16 DIAGNOSIS — Z7901 Long term (current) use of anticoagulants: Secondary | ICD-10-CM

## 2017-08-16 LAB — CUP PACEART REMOTE DEVICE CHECK
Brady Statistic AP VP Percent: 19 %
Brady Statistic AP VS Percent: 59 %
Brady Statistic AS VP Percent: 3.4 %
Brady Statistic AS VS Percent: 17 %
Brady Statistic RA Percent Paced: 77 %
Brady Statistic RV Percent Paced: 23 %
Implantable Pulse Generator Implant Date: 20130725
Lead Channel Impedance Value: 480 Ohm
Lead Channel Pacing Threshold Amplitude: 1 V
Lead Channel Pacing Threshold Pulse Width: 0.5 ms
Lead Channel Pacing Threshold Pulse Width: 0.5 ms
Lead Channel Sensing Intrinsic Amplitude: 12 mV
Lead Channel Setting Pacing Amplitude: 2.5 V
Lead Channel Setting Pacing Amplitude: 2.5 V
Lead Channel Setting Pacing Pulse Width: 0.5 ms
Lead Channel Setting Sensing Sensitivity: 2 mV
MDC IDC LEAD IMPLANT DT: 19930223
MDC IDC LEAD IMPLANT DT: 19930223
MDC IDC LEAD LOCATION: 753859
MDC IDC LEAD LOCATION: 753860
MDC IDC MSMT BATTERY REMAINING LONGEVITY: 76 mo
MDC IDC MSMT BATTERY REMAINING PERCENTAGE: 73 %
MDC IDC MSMT BATTERY VOLTAGE: 2.92 V
MDC IDC MSMT LEADCHNL RA IMPEDANCE VALUE: 430 Ohm
MDC IDC MSMT LEADCHNL RA PACING THRESHOLD AMPLITUDE: 1.25 V
MDC IDC MSMT LEADCHNL RA SENSING INTR AMPL: 3.1 mV
MDC IDC SESS DTM: 20190130070009
Pulse Gen Serial Number: 7364115

## 2017-08-16 LAB — POCT INR: INR: 3.4

## 2017-08-16 NOTE — Patient Instructions (Addendum)
Pre visit review using our clinic review tool, if applicable. No additional management support is needed unless otherwise documented below in the visit note.  Continue taking 5mg  (1 pill) on Mondays, Thursdays and Saturdays and 2.5mg  (1/2pill) on Sun, Tues, Wed, Fri.  Recheck in 4 weeks.  He will continue his normal bi-weekly spinach green consumption.

## 2017-08-25 ENCOUNTER — Other Ambulatory Visit: Payer: Self-pay | Admitting: Internal Medicine

## 2017-08-28 ENCOUNTER — Telehealth: Payer: Self-pay | Admitting: Family Medicine

## 2017-08-28 NOTE — Telephone Encounter (Signed)
Pt is requesting refill of coumadin and is currently going to the coumadin clinic.   LOV: 08/16/17  PCP: Dr. Sharen Hones

## 2017-08-28 NOTE — Telephone Encounter (Signed)
Copied from CRM [id number]. Topic: Quick Communication - Rx Refill/Question >> Aug 28, 2017 10:45 AM Alexander Bergeron B wrote: Medication: warfarin (COUMADIN) 5 MG tablet [[id number]]     Has the patient contacted their pharmacy? Yes.     (Agent: If no, request that the patient contact the pharmacy for the refill.)   Preferred Pharmacy (with phone number or street name): [facility]   Agent: Please be advised that RX refills may take up to 3 business days. We ask that you follow-up with your pharmacy.

## 2017-08-28 NOTE — Telephone Encounter (Signed)
Wife has 2 doses left and needs refill as soon as possible

## 2017-08-28 NOTE — Telephone Encounter (Signed)
Copied from CRM [id number]. Topic: Quick Communication - Rx Refill/Question >> Aug 28, 2017 10:45 AM Noah Thompson B wrote: Medication: warfarin (COUMADIN) 5 MG tablet [[id number]]     Has the patient contacted their pharmacy? Yes.     (Agent: If no, request that the patient contact the pharmacy for the refill.)   Preferred Pharmacy (with phone number or street name): [facility]   Agent: Please be advised that RX refills may take up to 3 business days. We ask that you follow-up with your pharmacy. >> Aug 28, 2017  4:19 PM Raquel Sarna wrote: [name] called back to check on Rx for pt.  She is requesting it be done today.  However, she was told of the 2 to 3 business days turn around time.

## 2017-08-29 NOTE — Telephone Encounter (Signed)
Patient is compliant with coumadin management.  Will refill X 6-8 months.

## 2017-09-03 NOTE — Telephone Encounter (Signed)
Medication was refilled per protocol on 08/29/17.  Receipt confirmed by pharmacy.

## 2017-09-13 ENCOUNTER — Ambulatory Visit: Payer: Medicare Other | Admitting: General Practice

## 2017-09-13 ENCOUNTER — Ambulatory Visit: Payer: Medicare Other

## 2017-09-13 DIAGNOSIS — Z7901 Long term (current) use of anticoagulants: Secondary | ICD-10-CM | POA: Diagnosis not present

## 2017-09-13 LAB — POCT INR: INR: 2.9

## 2017-09-13 NOTE — Patient Instructions (Addendum)
Pre visit review using our clinic review tool, if applicable. No additional management support is needed unless otherwise documented below in the visit note.  Continue taking 5mg  (1 pill) on Mondays, Thursdays and Saturdays and 2.5mg  (1/2pill) on Sun, Tues, Wed, Fri.  Recheck in 6 weeks.  He will continue his normal bi-weekly spinach green consumption.

## 2017-10-25 ENCOUNTER — Ambulatory Visit: Payer: Medicare Other

## 2017-10-31 ENCOUNTER — Ambulatory Visit (INDEPENDENT_AMBULATORY_CARE_PROVIDER_SITE_OTHER): Payer: Medicare Other | Admitting: *Deleted

## 2017-10-31 DIAGNOSIS — I441 Atrioventricular block, second degree: Secondary | ICD-10-CM | POA: Diagnosis not present

## 2017-10-31 NOTE — Progress Notes (Signed)
Remote pacemaker transmission.   

## 2017-11-01 ENCOUNTER — Encounter: Payer: Self-pay | Admitting: Cardiology

## 2017-11-20 LAB — CUP PACEART REMOTE DEVICE CHECK
Battery Remaining Percentage: 65 %
Brady Statistic AS VS Percent: 18 %
Date Time Interrogation Session: 20190501060009
Implantable Pulse Generator Implant Date: 20130725
Lead Channel Pacing Threshold Amplitude: 1 V
Lead Channel Pacing Threshold Amplitude: 1.25 V
Lead Channel Pacing Threshold Pulse Width: 0.5 ms
Lead Channel Sensing Intrinsic Amplitude: 2 mV
Lead Channel Setting Pacing Amplitude: 2.5 V
Lead Channel Setting Sensing Sensitivity: 2 mV
MDC IDC LEAD IMPLANT DT: 19930223
MDC IDC LEAD IMPLANT DT: 19930223
MDC IDC LEAD LOCATION: 753859
MDC IDC LEAD LOCATION: 753860
MDC IDC MSMT BATTERY REMAINING LONGEVITY: 67 mo
MDC IDC MSMT BATTERY VOLTAGE: 2.9 V
MDC IDC MSMT LEADCHNL RA IMPEDANCE VALUE: 460 Ohm
MDC IDC MSMT LEADCHNL RA PACING THRESHOLD PULSEWIDTH: 0.5 ms
MDC IDC MSMT LEADCHNL RV IMPEDANCE VALUE: 530 Ohm
MDC IDC MSMT LEADCHNL RV SENSING INTR AMPL: 12 mV
MDC IDC SET LEADCHNL RA PACING AMPLITUDE: 2.5 V
MDC IDC SET LEADCHNL RV PACING PULSEWIDTH: 0.5 ms
MDC IDC STAT BRADY AP VP PERCENT: 30 %
MDC IDC STAT BRADY AP VS PERCENT: 46 %
MDC IDC STAT BRADY AS VP PERCENT: 4.8 %
MDC IDC STAT BRADY RA PERCENT PACED: 75 %
MDC IDC STAT BRADY RV PERCENT PACED: 35 %
Pulse Gen Model: 2210
Pulse Gen Serial Number: 7364115

## 2017-12-27 ENCOUNTER — Ambulatory Visit: Payer: Medicare Other | Admitting: General Practice

## 2017-12-27 DIAGNOSIS — Z7901 Long term (current) use of anticoagulants: Secondary | ICD-10-CM

## 2017-12-27 LAB — POCT INR: INR: 2.9 (ref 2.0–3.0)

## 2017-12-27 NOTE — Patient Instructions (Addendum)
Pre visit review using our clinic review tool, if applicable. No additional management support is needed unless otherwise documented below in the visit note.  Continue taking 5mg (1 pill) on Mondays, Thursdays and Saturdays and 2.5mg (1/2pill) on Sun, Tues, Wed, Fri.  Recheck in 6 weeks.  He will continue his normal bi-weekly spinach green consumption.    

## 2018-01-04 ENCOUNTER — Other Ambulatory Visit: Payer: Self-pay | Admitting: Internal Medicine

## 2018-01-11 ENCOUNTER — Other Ambulatory Visit: Payer: Self-pay | Admitting: Internal Medicine

## 2018-01-11 NOTE — Telephone Encounter (Signed)
Outpatient Medication Detail    Disp Refills Start End   metoprolol succinate (TOPROL-XL) 50 MG 24 hr tablet 30 tablet 0 01/04/2018    Sig: TAKE 1 TABLET BY MOUTH ONCE DAILY WITH OR IMMEDIATELY FOLLOWING A MEAL   Sent to pharmacy as: metoprolol succinate (TOPROL-XL) 50 MG 24 hr tablet   E-Prescribing Status: Receipt confirmed by pharmacy (01/04/2018 12:47 PM EDT)   Pharmacy   [id number] - [place], [place] - [address]

## 2018-01-30 ENCOUNTER — Ambulatory Visit (INDEPENDENT_AMBULATORY_CARE_PROVIDER_SITE_OTHER): Payer: Medicare Other | Admitting: *Deleted

## 2018-01-30 ENCOUNTER — Encounter: Payer: Self-pay | Admitting: Cardiology

## 2018-01-30 DIAGNOSIS — I441 Atrioventricular block, second degree: Secondary | ICD-10-CM

## 2018-01-30 NOTE — Progress Notes (Signed)
Remote pacemaker transmission.   

## 2018-02-04 ENCOUNTER — Other Ambulatory Visit: Payer: Self-pay | Admitting: Internal Medicine

## 2018-02-07 ENCOUNTER — Ambulatory Visit: Payer: Medicare Other

## 2018-02-26 LAB — CUP PACEART REMOTE DEVICE CHECK
Battery Remaining Percentage: 57 %
Battery Voltage: 2.89 V
Brady Statistic AS VP Percent: 6.1 %
Brady Statistic AS VS Percent: 18 %
Date Time Interrogation Session: 20190731060008
Implantable Pulse Generator Implant Date: 20130725
Lead Channel Impedance Value: 530 Ohm
Lead Channel Pacing Threshold Amplitude: 1 V
Lead Channel Pacing Threshold Amplitude: 1.25 V
Lead Channel Pacing Threshold Pulse Width: 0.5 ms
Lead Channel Sensing Intrinsic Amplitude: 1.8 mV
Lead Channel Setting Pacing Amplitude: 2.5 V
Lead Channel Setting Pacing Amplitude: 2.5 V
Lead Channel Setting Pacing Pulse Width: 0.5 ms
Lead Channel Setting Sensing Sensitivity: 2 mV
MDC IDC LEAD IMPLANT DT: 19930223
MDC IDC LEAD IMPLANT DT: 19930223
MDC IDC LEAD LOCATION: 753859
MDC IDC LEAD LOCATION: 753860
MDC IDC MSMT BATTERY REMAINING LONGEVITY: 59 mo
MDC IDC MSMT LEADCHNL RA IMPEDANCE VALUE: 450 Ohm
MDC IDC MSMT LEADCHNL RA PACING THRESHOLD PULSEWIDTH: 0.5 ms
MDC IDC MSMT LEADCHNL RV SENSING INTR AMPL: 12 mV
MDC IDC STAT BRADY AP VP PERCENT: 37 %
MDC IDC STAT BRADY AP VS PERCENT: 38 %
MDC IDC STAT BRADY RA PERCENT PACED: 74 %
MDC IDC STAT BRADY RV PERCENT PACED: 43 %
Pulse Gen Model: 2210
Pulse Gen Serial Number: 7364115

## 2018-03-21 ENCOUNTER — Ambulatory Visit: Payer: Medicare Other | Admitting: General Practice

## 2018-03-21 DIAGNOSIS — Z7901 Long term (current) use of anticoagulants: Secondary | ICD-10-CM

## 2018-03-21 LAB — POCT INR: INR: 3.9 — AB (ref 2.0–3.0)

## 2018-03-21 NOTE — Patient Instructions (Addendum)
Pre visit review using our clinic review tool, if applicable. No additional management support is needed unless otherwise documented below in the visit note.  Skip coumadin today (9/19) and then continue taking 5mg  (1 pill) on Mondays, Thursdays and Saturdays and 2.5mg  (1/2pill) on Sun, Tues, Wed, Fri.  Recheck in 4 weeks.  He will continue his normal bi-weekly spinach green consumption.

## 2018-04-03 ENCOUNTER — Encounter: Payer: Self-pay | Admitting: Internal Medicine

## 2018-04-03 ENCOUNTER — Ambulatory Visit: Payer: Medicare Other | Admitting: Internal Medicine

## 2018-04-03 ENCOUNTER — Ambulatory Visit
Admission: RE | Admit: 2018-04-03 | Discharge: 2018-04-03 | Disposition: A | Discharge status: Home / Self Care | Payer: Medicare Other | Source: Ambulatory Visit | Attending: Internal Medicine | Admitting: Internal Medicine

## 2018-04-03 VITALS — BP 126/82 | HR 65 | Ht 71.0 in | Wt 174.8 lb

## 2018-04-03 DIAGNOSIS — I441 Atrioventricular block, second degree: Secondary | ICD-10-CM

## 2018-04-03 DIAGNOSIS — Z23 Encounter for immunization: Secondary | ICD-10-CM | POA: Diagnosis not present

## 2018-04-03 DIAGNOSIS — I10 Essential (primary) hypertension: Secondary | ICD-10-CM | POA: Diagnosis not present

## 2018-04-03 DIAGNOSIS — I428 Other cardiomyopathies: Secondary | ICD-10-CM | POA: Diagnosis not present

## 2018-04-03 DIAGNOSIS — Z950 Presence of cardiac pacemaker: Secondary | ICD-10-CM

## 2018-04-03 NOTE — Progress Notes (Signed)
PCP: [name], MD   Primary EP:  Dr Johney Frame  Noah Thompson is a 77 y.o. male who presents today for routine electrophysiology followup.  Since last being seen in our clinic, the patient reports doing very well.  Today, he denies symptoms of palpitations, chest pain, shortness of breath,  lower extremity edema, dizziness, presyncope, or syncope.  The patient is otherwise without complaint today.   He feels well and is still active though his wife has noticed fatigue at night.  Past Medical History:  Diagnosis Date  . CAP (community acquired pneumonia) 07/25/2017  . Cataract    left eye  . CHF (congestive heart failure) (HCC)   . Complete heart block (HCC)    a. 1993 s/p PPM 2/2 perioperative heart block;  b. 01/2012 s/p SJM Accent DC PPM upgrade.  . Depression   . GERD (gastroesophageal reflux disease)   . History of nephritis    a. as child.  Marland Kitchen History of pneumonia   . Hyperlipidemia   . Hypertension   . NICM (nonischemic cardiomyopathy) (HCC)    a. 06/2012 Echo: EF 45-50%;  b. 08/2013 Echo: EF 20-25%;  c. 10/2013 Adenosine MV: Fixed septal defect likely representing LBBB-related artifact, no reversible ischemia, EF 43%, low risk.  . Paroxysmal atrial tachycardia (HCC)    a. 07/2013 noted on device interrogation.  . Presence of permanent cardiac pacemaker 2013  . RA (rheumatoid arthritis) (HCC)   . S/P aortic valve replacement    a. 1993 s/p SJM mechanical AVR for aortic stenosis - chronic coumadin.  Marland Kitchen Shortness of breath dyspnea    Past Surgical History:  Procedure Laterality Date  . ANAL FISSURE REPAIR  1970s  . AORTIC VALVE REPLACEMENT  1993   St. Jude Mechanical Valve  . INSERT / REPLACE / REMOVE PACEMAKER    . PACEMAKER INSERTION  1993   for AV block, not pacemaker dependant  . PERMANENT PACEMAKER GENERATOR CHANGE N/A 01/25/2012   Procedure: PERMANENT PACEMAKER GENERATOR CHANGE;  Surgeon: [name], MD;  Location: [facility] CATH LAB;  Service: Cardiovascular;   Laterality: N/A;    ROS- all systems are reviewed and negative except as per HPI above  Current Outpatient Medications  Medication Sig Dispense Refill  . alum & mag hydroxide-simeth (MAALOX/MYLANTA) 200-200-20 MG/5ML suspension Take 15 mLs by mouth every 6 (six) hours as needed for indigestion or heartburn.    . chlorpheniramine-HYDROcodone (TUSSIONEX PENNKINETIC ER) 10-8 MG/5ML SUER Take 5 mLs by mouth at bedtime as needed for cough. 20 mL 0  . losartan (COZAAR) 100 MG tablet Take 1 tablet (100 mg total) by mouth daily. 30 tablet 0  . losartan (COZAAR) 50 MG tablet Take 1 tablet (50 mg total) by mouth daily. 90 tablet 3  . metoprolol succinate (TOPROL-XL) 50 MG 24 hr tablet Take 1 tablet (50 mg total) by mouth daily. With or immediately following a meal. Please keep upcomming appt in October. Thank you 90 tablet 0  . omeprazole (PRILOSEC) 40 MG capsule Take 1 capsule (40 mg total) by mouth daily. Take daily for 3 days then as needed 30 capsule 1  . ranitidine (ZANTAC) 150 MG capsule Take 150 mg by mouth daily as needed for heartburn.     . warfarin (COUMADIN) 5 MG tablet Take 2.5-5 mg by mouth See admin instructions. 2.5 mg Sunday, Tuesday & Wednesday. All other days- 5 mg    . warfarin (COUMADIN) 5 MG tablet TAKE AS DIRECTED BY  COUMADIN  CLINIC  80 tablet 1   No current facility-administered medications for this visit.     Physical Exam: Vitals:   04/03/18 1232  BP: 126/82  Pulse: 65  SpO2: 98%  Weight: 174 lb 12.8 oz (79.3 kg)  Height: 5\' 11"  (1.803 m)    GEN- The patient is well appearing, alert and oriented x 3 today.   Head- normocephalic, atraumatic Eyes-  Sclera clear, conjunctiva pink Ears- hearing intact Oropharynx- clear Lungs- Clear to ausculation bilaterally, normal work of breathing Chest- pacemaker pocket is well healed Heart- Regular rate and rhythm, no murmurs, rubs or gallops, PMI not laterally displaced GI- soft, NT, ND, + BS Extremities- no clubbing,  cyanosis, or edema  Pacemaker interrogation- reviewed in detail today,  See PACEART report  ekg tracing ordered today is personally reviewed and shows V pacing  Assessment and Plan:  1. 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  His atrial lead threshold has increased substantially (now 2.75V @1  msec).  He is not capturing his A today and true A is in refractoriness.  This has led to increased A and V pacing. I have turned atrial output up to 5V @1  msec today.  I have also reduced pacing rate from 60 to 50 bpm and turned rate response off to minimize pacing. See Arita Miss Art report V paces 48%, A paces 74% Will obtain CXR today I suspect that he may eventually require lead revision. Would consider upgrade at that time.  2. Chronic systolic dysfunction/ nonischemic CM/ valvular heart disease EF 30-35% by echo 12/22/16 Continue medical therapy Consider upgrade to CRT-D if A lead revision is required.  3. HTN Stable No change required today   4. Mechanical aortic valve Continue coumadin Moderate stenosis by echo previously, not noticed on last echo.  Repeat echo on return.  Consider TEE if symptoms worsen.  [name] MD, [facility] 04/03/2018 12:47 PM

## 2018-04-03 NOTE — Patient Instructions (Addendum)
Medication Instructions:  Your physician recommends that you continue on your current medications as directed. Please refer to the Current Medication list given to you today.  Labwork: None ordered.  Testing/Procedures: A chest x-ray takes a picture of the organs and structures inside the chest, including the heart, lungs, and blood vessels. This test can show several things, including, whether the heart is enlarges; whether fluid is building up in the lungs; and whether pacemaker / defibrillator leads are still in place.  Follow-Up: Your physician wants you to follow-up in: 6 weeks with Dr. Johney Frame.      Remote monitoring is used to monitor your Pacemaker from home. This monitoring reduces the number of office visits required to check your device to one time per year. It allows [place] to keep an eye on the functioning of your device to ensure it is working properly. You are scheduled for a device check from home on 05/01/2018. You may send your transmission at any time that day. If you have a wireless device, the transmission will be sent automatically. After your physician reviews your transmission, you will receive a postcard with your next transmission date.  Any Other Special Instructions Will Be Listed Below (If Applicable).  If you need a refill on your cardiac medications before your next appointment, please call your pharmacy.

## 2018-04-20 ENCOUNTER — Other Ambulatory Visit: Payer: Self-pay | Admitting: Family Medicine

## 2018-04-22 NOTE — Telephone Encounter (Signed)
Fwd to Dr. Reece Agar in [facility] absence for warfarin refill.

## 2018-04-23 NOTE — Telephone Encounter (Signed)
Noah Thompson calling to check on the status of this refill request.  Pt only has 2 pills left.

## 2018-04-23 NOTE — Telephone Encounter (Signed)
Patient notified by telephone that script has been sent to the pharmacy. 

## 2018-04-23 NOTE — Telephone Encounter (Signed)
plz notify this was refilled 

## 2018-04-25 ENCOUNTER — Ambulatory Visit: Payer: Medicare Other | Admitting: General Practice

## 2018-04-25 DIAGNOSIS — Z7901 Long term (current) use of anticoagulants: Secondary | ICD-10-CM | POA: Diagnosis not present

## 2018-04-25 LAB — POCT INR: INR: 3 (ref 2.0–3.0)

## 2018-04-25 NOTE — Patient Instructions (Addendum)
Pre visit review using our clinic review tool, if applicable. No additional management support is needed unless otherwise documented below in the visit note.  Continue taking 5mg (1 pill) on Mondays, Thursdays and Saturdays and 2.5mg (1/2pill) on Sun, Tues, Wed, Fri.  Recheck in 4 weeks.  He will continue his normal bi-weekly spinach green consumption.    

## 2018-05-01 ENCOUNTER — Ambulatory Visit: Payer: Medicare Other | Admitting: *Deleted

## 2018-05-01 DIAGNOSIS — I441 Atrioventricular block, second degree: Secondary | ICD-10-CM

## 2018-05-01 DIAGNOSIS — R55 Syncope and collapse: Secondary | ICD-10-CM

## 2018-05-01 NOTE — Progress Notes (Signed)
Remote pacemaker transmission.   

## 2018-05-02 ENCOUNTER — Other Ambulatory Visit: Payer: Self-pay | Admitting: Internal Medicine

## 2018-05-02 NOTE — Telephone Encounter (Signed)
Pharmacy is requesting Metoprolol 50mg  4 times daily. It is documented in his chart as 1/qd. Please address. Thank You.

## 2018-05-09 ENCOUNTER — Other Ambulatory Visit: Payer: Self-pay

## 2018-05-09 LAB — CUP PACEART INCLINIC DEVICE CHECK
Battery Remaining Longevity: 26 mo
Battery Voltage: 2.89 V
Brady Statistic RV Percent Paced: 48 %
Implantable Lead Implant Date: 19930223
Implantable Pulse Generator Implant Date: 20130725
Lead Channel Impedance Value: 450 Ohm
Lead Channel Pacing Threshold Amplitude: 2.75 V
Lead Channel Pacing Threshold Pulse Width: 0.5 ms
Lead Channel Pacing Threshold Pulse Width: 1 ms
Lead Channel Setting Pacing Amplitude: 2.5 V
Lead Channel Setting Pacing Amplitude: 5 V
MDC IDC LEAD IMPLANT DT: 19930223
MDC IDC LEAD LOCATION: 753859
MDC IDC LEAD LOCATION: 753860
MDC IDC MSMT LEADCHNL RA SENSING INTR AMPL: 1.8 mV
MDC IDC MSMT LEADCHNL RV IMPEDANCE VALUE: 550 Ohm
MDC IDC MSMT LEADCHNL RV PACING THRESHOLD AMPLITUDE: 1 V
MDC IDC MSMT LEADCHNL RV SENSING INTR AMPL: 12 mV
MDC IDC PG SERIAL: 7364115
MDC IDC SESS DTM: 20191002163043
MDC IDC SET LEADCHNL RV PACING PULSEWIDTH: 0.5 ms
MDC IDC SET LEADCHNL RV SENSING SENSITIVITY: 2 mV
MDC IDC STAT BRADY RA PERCENT PACED: 74 %

## 2018-05-10 ENCOUNTER — Encounter: Payer: Self-pay | Admitting: Cardiology

## 2018-05-15 ENCOUNTER — Encounter: Payer: Medicare Other | Admitting: Internal Medicine

## 2018-05-23 ENCOUNTER — Ambulatory Visit: Payer: Medicare Other

## 2018-06-05 ENCOUNTER — Encounter: Payer: Medicare Other | Admitting: Internal Medicine

## 2018-06-06 ENCOUNTER — Ambulatory Visit (INDEPENDENT_AMBULATORY_CARE_PROVIDER_SITE_OTHER): Payer: Medicare Other | Admitting: General Practice

## 2018-06-06 DIAGNOSIS — Z7901 Long term (current) use of anticoagulants: Secondary | ICD-10-CM

## 2018-06-06 LAB — POCT INR: INR: 4 — AB (ref 2.0–3.0)

## 2018-06-06 NOTE — Patient Instructions (Addendum)
Pre visit review using our clinic review tool, if applicable. No additional management support is needed unless otherwise documented below in the visit note.  Hold dosage today and then continue taking 5mg  (1 pill) on Mondays, Thursdays and Saturdays and 2.5mg  (1/2pill) on Sun, Tues, Wed, Fri.  Recheck in 4 weeks.  He will continue his normal bi-weekly spinach green consumption.

## 2018-07-02 LAB — CUP PACEART REMOTE DEVICE CHECK
Battery Remaining Percentage: 57 %
Brady Statistic AP VS Percent: 26 %
Brady Statistic AS VS Percent: 71 %
Brady Statistic RA Percent Paced: 23 %
Brady Statistic RV Percent Paced: 1 %
Date Time Interrogation Session: 20191030075928
Implantable Lead Implant Date: 19930223
Implantable Pulse Generator Implant Date: 20130725
Lead Channel Impedance Value: 480 Ohm
Lead Channel Pacing Threshold Amplitude: 1 V
Lead Channel Pacing Threshold Pulse Width: 0.5 ms
Lead Channel Pacing Threshold Pulse Width: 1 ms
Lead Channel Sensing Intrinsic Amplitude: 2.1 mV
Lead Channel Setting Pacing Amplitude: 2.5 V
Lead Channel Setting Pacing Pulse Width: 0.5 ms
MDC IDC LEAD IMPLANT DT: 19930223
MDC IDC LEAD LOCATION: 753859
MDC IDC LEAD LOCATION: 753860
MDC IDC MSMT BATTERY REMAINING LONGEVITY: 45 mo
MDC IDC MSMT BATTERY VOLTAGE: 2.89 V
MDC IDC MSMT LEADCHNL RA PACING THRESHOLD AMPLITUDE: 2.75 V
MDC IDC MSMT LEADCHNL RV IMPEDANCE VALUE: 530 Ohm
MDC IDC MSMT LEADCHNL RV SENSING INTR AMPL: 12 mV
MDC IDC SET LEADCHNL RA PACING AMPLITUDE: 5 V
MDC IDC SET LEADCHNL RV SENSING SENSITIVITY: 2 mV
MDC IDC STAT BRADY AP VP PERCENT: 1 %
MDC IDC STAT BRADY AS VP PERCENT: 1 %
Pulse Gen Model: 2210
Pulse Gen Serial Number: 7364115

## 2018-07-04 ENCOUNTER — Ambulatory Visit: Payer: Medicare Other

## 2018-07-10 ENCOUNTER — Encounter: Payer: Medicare Other | Admitting: Internal Medicine

## 2018-07-31 ENCOUNTER — Ambulatory Visit (INDEPENDENT_AMBULATORY_CARE_PROVIDER_SITE_OTHER): Payer: Medicare Other

## 2018-07-31 DIAGNOSIS — I441 Atrioventricular block, second degree: Secondary | ICD-10-CM | POA: Diagnosis not present

## 2018-07-31 DIAGNOSIS — R55 Syncope and collapse: Secondary | ICD-10-CM

## 2018-08-01 NOTE — Progress Notes (Signed)
Remote pacemaker transmission.   

## 2018-08-02 LAB — CUP PACEART REMOTE DEVICE CHECK
Brady Statistic AP VP Percent: 1 %
Brady Statistic AP VS Percent: 18 %
Brady Statistic AS VP Percent: 1 %
Brady Statistic AS VS Percent: 80 %
Brady Statistic RA Percent Paced: 16 %
Brady Statistic RV Percent Paced: 1 %
Implantable Lead Implant Date: 19930223
Implantable Lead Implant Date: 19930223
Implantable Lead Location: 753859
Implantable Lead Location: 753860
Lead Channel Pacing Threshold Amplitude: 1 V
Lead Channel Pacing Threshold Pulse Width: 0.5 ms
Lead Channel Pacing Threshold Pulse Width: 1 ms
Lead Channel Sensing Intrinsic Amplitude: 12 mV
Lead Channel Sensing Intrinsic Amplitude: 2.6 mV
Lead Channel Setting Pacing Amplitude: 2.5 V
Lead Channel Setting Pacing Amplitude: 5 V
Lead Channel Setting Pacing Pulse Width: 0.5 ms
Lead Channel Setting Sensing Sensitivity: 2 mV
MDC IDC MSMT BATTERY REMAINING LONGEVITY: 48 mo
MDC IDC MSMT BATTERY REMAINING PERCENTAGE: 57 %
MDC IDC MSMT BATTERY VOLTAGE: 2.89 V
MDC IDC MSMT LEADCHNL RA IMPEDANCE VALUE: 480 Ohm
MDC IDC MSMT LEADCHNL RA PACING THRESHOLD AMPLITUDE: 2.75 V
MDC IDC MSMT LEADCHNL RV IMPEDANCE VALUE: 530 Ohm
MDC IDC PG IMPLANT DT: 20130725
MDC IDC PG SERIAL: 7364115
MDC IDC SESS DTM: 20200129082856

## 2018-08-09 ENCOUNTER — Other Ambulatory Visit: Payer: Self-pay | Admitting: Family Medicine

## 2018-08-09 NOTE — Telephone Encounter (Signed)
E-scribed refill.  Please schedule annual wellness visit.  

## 2018-08-12 ENCOUNTER — Other Ambulatory Visit: Payer: Self-pay

## 2018-08-12 MED ORDER — LOSARTAN POTASSIUM 50 MG PO TABS: 50.0000 mg | ORAL_TABLET | Freq: Every day | ORAL | 1 refills | Status: DC

## 2018-08-12 NOTE — Telephone Encounter (Signed)
Left message asking pt to call office  Start looking for cpx and medicare wellness in may

## 2018-08-12 NOTE — Telephone Encounter (Signed)
E-scribed refill 

## 2018-08-13 ENCOUNTER — Ambulatory Visit (INDEPENDENT_AMBULATORY_CARE_PROVIDER_SITE_OTHER): Payer: Medicare Other

## 2018-08-13 DIAGNOSIS — Z7901 Long term (current) use of anticoagulants: Secondary | ICD-10-CM | POA: Diagnosis not present

## 2018-08-13 LAB — POCT INR: INR: 2.6 (ref 2.0–3.0)

## 2018-08-13 NOTE — Patient Instructions (Signed)
INR today 2.6  Continue taking 5mg  (1 pill) on Mondays, Thursdays and Saturdays and 2.5mg  (1/2pill) on Sun, Tues, Wed, Fri.  Recheck in 4 weeks.  He will continue his normal bi-weekly spinach green consumption.

## 2018-08-14 NOTE — Telephone Encounter (Signed)
Noah Thompson 5/7  cpx 5/12

## 2018-08-14 NOTE — Progress Notes (Signed)
Agree. Thanks

## 2018-08-14 NOTE — Telephone Encounter (Signed)
Noted  

## 2018-09-12 ENCOUNTER — Ambulatory Visit: Payer: Medicare Other

## 2018-10-28 ENCOUNTER — Telehealth: Payer: Self-pay

## 2018-10-28 NOTE — Telephone Encounter (Signed)
Left detailed VM w COVID screen and curbside info   

## 2018-10-30 ENCOUNTER — Other Ambulatory Visit: Payer: Self-pay

## 2018-10-30 ENCOUNTER — Ambulatory Visit (INDEPENDENT_AMBULATORY_CARE_PROVIDER_SITE_OTHER): Payer: Medicare Other | Admitting: *Deleted

## 2018-10-30 DIAGNOSIS — I441 Atrioventricular block, second degree: Secondary | ICD-10-CM

## 2018-10-30 DIAGNOSIS — R55 Syncope and collapse: Secondary | ICD-10-CM

## 2018-10-30 LAB — CUP PACEART REMOTE DEVICE CHECK
Battery Remaining Longevity: 44 mo
Battery Remaining Percentage: 51 %
Battery Voltage: 2.87 V
Brady Statistic AP VP Percent: 1 %
Brady Statistic AP VS Percent: 18 %
Brady Statistic AS VP Percent: 1 %
Brady Statistic AS VS Percent: 81 %
Brady Statistic RA Percent Paced: 16 %
Brady Statistic RV Percent Paced: 1 %
Date Time Interrogation Session: 20200429070254
Implantable Lead Implant Date: 19930223
Implantable Lead Implant Date: 19930223
Implantable Lead Location: 753859
Implantable Lead Location: 753860
Implantable Pulse Generator Implant Date: 20130725
Lead Channel Impedance Value: 530 Ohm
Lead Channel Impedance Value: 530 Ohm
Lead Channel Pacing Threshold Amplitude: 1 V
Lead Channel Pacing Threshold Amplitude: 2.75 V
Lead Channel Pacing Threshold Pulse Width: 0.5 ms
Lead Channel Pacing Threshold Pulse Width: 1 ms
Lead Channel Sensing Intrinsic Amplitude: 12 mV
Lead Channel Sensing Intrinsic Amplitude: 3.5 mV
Lead Channel Setting Pacing Amplitude: 2.5 V
Lead Channel Setting Pacing Amplitude: 5 V
Lead Channel Setting Pacing Pulse Width: 0.5 ms
Lead Channel Setting Sensing Sensitivity: 2 mV
Pulse Gen Model: 2210
Pulse Gen Serial Number: 7364115

## 2018-10-31 ENCOUNTER — Other Ambulatory Visit (INDEPENDENT_AMBULATORY_CARE_PROVIDER_SITE_OTHER): Payer: Medicare Other

## 2018-10-31 ENCOUNTER — Ambulatory Visit (INDEPENDENT_AMBULATORY_CARE_PROVIDER_SITE_OTHER): Payer: Medicare Other | Admitting: General Practice

## 2018-10-31 DIAGNOSIS — Z952 Presence of prosthetic heart valve: Secondary | ICD-10-CM

## 2018-10-31 DIAGNOSIS — Z7901 Long term (current) use of anticoagulants: Secondary | ICD-10-CM

## 2018-10-31 LAB — POCT INR: INR: 2.7 (ref 2.0–3.0)

## 2018-10-31 NOTE — Patient Instructions (Signed)
Pre visit review using our clinic review tool, if applicable. No additional management support is needed unless otherwise documented below in the visit note.  Continue taking 5mg  (1 pill) on Mondays, Thursdays and Saturdays and 2.5mg  (1/2pill) on Sun, Tues, Wed, Fri.  Continue e-check in 4 to 5 weeks.  He will continue his normal bi-weekly spinach green consumption.

## 2018-11-01 ENCOUNTER — Telehealth: Payer: Self-pay | Admitting: Family Medicine

## 2018-11-01 DIAGNOSIS — E785 Hyperlipidemia, unspecified: Secondary | ICD-10-CM

## 2018-11-01 DIAGNOSIS — I10 Essential (primary) hypertension: Secondary | ICD-10-CM

## 2018-11-01 DIAGNOSIS — R972 Elevated prostate specific antigen [PSA]: Secondary | ICD-10-CM

## 2018-11-01 DIAGNOSIS — M109 Gout, unspecified: Secondary | ICD-10-CM

## 2018-11-01 NOTE — Telephone Encounter (Signed)
Patient will be coming to the office 11/07/2018 to have his lab work done for his visits on 11/08/2018 and 11/12/2018.   Could you please put in the lab orders for this visit,

## 2018-11-01 NOTE — Telephone Encounter (Signed)
Labs ordered.

## 2018-11-07 ENCOUNTER — Other Ambulatory Visit (INDEPENDENT_AMBULATORY_CARE_PROVIDER_SITE_OTHER): Payer: Medicare Other

## 2018-11-07 ENCOUNTER — Ambulatory Visit: Payer: Medicare Other

## 2018-11-07 DIAGNOSIS — M109 Gout, unspecified: Secondary | ICD-10-CM

## 2018-11-07 DIAGNOSIS — E785 Hyperlipidemia, unspecified: Secondary | ICD-10-CM

## 2018-11-07 DIAGNOSIS — R972 Elevated prostate specific antigen [PSA]: Secondary | ICD-10-CM

## 2018-11-07 LAB — CBC WITH DIFFERENTIAL/PLATELET
Basophils Absolute: 0.1 10*3/uL (ref 0.0–0.1)
Basophils Relative: 0.9 % (ref 0.0–3.0)
Eosinophils Absolute: 0.8 10*3/uL — ABNORMAL HIGH (ref 0.0–0.7)
Eosinophils Relative: 10.5 % — ABNORMAL HIGH (ref 0.0–5.0)
HCT: 42.1 % (ref 39.0–52.0)
Hemoglobin: 14.4 g/dL (ref 13.0–17.0)
Lymphocytes Relative: 25.2 % (ref 12.0–46.0)
Lymphs Abs: 1.9 10*3/uL (ref 0.7–4.0)
MCHC: 34.2 g/dL (ref 30.0–36.0)
MCV: 87.8 fl (ref 78.0–100.0)
Monocytes Absolute: 0.6 10*3/uL (ref 0.1–1.0)
Monocytes Relative: 8.5 % (ref 3.0–12.0)
Neutro Abs: 4.2 10*3/uL (ref 1.4–7.7)
Neutrophils Relative %: 54.9 % (ref 43.0–77.0)
Platelets: 238 10*3/uL (ref 150.0–400.0)
RBC: 4.8 Mil/uL (ref 4.22–5.81)
RDW: 14.1 % (ref 11.5–15.5)
WBC: 7.7 10*3/uL (ref 4.0–10.5)

## 2018-11-07 LAB — COMPREHENSIVE METABOLIC PANEL
ALT: 16 U/L (ref 0–53)
AST: 9 U/L (ref 0–37)
Albumin: 4.4 g/dL (ref 3.5–5.2)
Alkaline Phosphatase: 75 U/L (ref 39–117)
BUN: 22 mg/dL (ref 6–23)
CO2: 30 mEq/L (ref 19–32)
Calcium: 9.5 mg/dL (ref 8.4–10.5)
Chloride: 103 mEq/L (ref 96–112)
Creatinine, Ser: 1.41 mg/dL (ref 0.40–1.50)
GFR: 48.63 mL/min — ABNORMAL LOW (ref >60.00)
Glucose, Bld: 90 mg/dL (ref 70–99)
Potassium: 4.4 mEq/L (ref 3.5–5.1)
Sodium: 141 mEq/L (ref 135–145)
Total Bilirubin: 0.6 mg/dL (ref 0.2–1.2)
Total Protein: 6.7 g/dL (ref 6.0–8.3)

## 2018-11-07 LAB — LIPID PANEL
Cholesterol: 217 mg/dL — ABNORMAL HIGH (ref 0–200)
HDL: 44.1 mg/dL (ref >39.00)
LDL Cholesterol: 135 mg/dL — ABNORMAL HIGH (ref 0–99)
NonHDL: 172.99
Total CHOL/HDL Ratio: 5
Triglycerides: 190 mg/dL — ABNORMAL HIGH (ref 0.0–149.0)
VLDL: 38 mg/dL (ref 0.0–40.0)

## 2018-11-07 LAB — TSH: TSH: 1.81 u[IU]/mL (ref 0.35–4.50)

## 2018-11-07 LAB — URIC ACID: Uric Acid, Serum: 7.5 mg/dL (ref 4.0–7.8)

## 2018-11-08 ENCOUNTER — Ambulatory Visit (INDEPENDENT_AMBULATORY_CARE_PROVIDER_SITE_OTHER): Payer: Medicare Other

## 2018-11-08 DIAGNOSIS — Z0000 Encounter for general adult medical examination without abnormal findings: Secondary | ICD-10-CM | POA: Diagnosis not present

## 2018-11-08 LAB — PSA, TOTAL WITH REFLEX TO PSA, FREE: PSA, Total: 2.7 ng/mL (ref <=4.0)

## 2018-11-08 NOTE — Progress Notes (Signed)
Subjective:   Noah Thompson is a 78 y.o. male who presents for Medicare Annual/Subsequent preventive examination.  Review of Systems:  N/A Cardiac Risk Factors include: advanced age (>55men, >72 women);dyslipidemia;male gender;hypertension     Objective:    Vitals: There were no vitals taken for this visit.  There is no height or weight on file to calculate BMI.  Advanced Directives 11/08/2018 07/27/2017 09/17/2014 01/25/2012  Does Patient Have a Medical Advance Directive? No No No Patient does not have advance directive  Would patient like information on creating a medical advance directive? No - Patient declined No - Patient declined Yes - Transport planner given -  Pre-existing out of facility DNR order (yellow form or pink MOST form) - - - No    Tobacco Social History   Tobacco Use  Smoking Status Never Smoker  Smokeless Tobacco Never Used     Counseling given: No   Clinical Intake:  Pre-visit preparation completed: Yes  Pain : No/denies pain Pain Score: 0-No pain     Nutritional Status: BMI 25 -29 Overweight Nutritional Risks: None  How often do you need to have someone help you when you read instructions, pamphlets, or other written materials from your doctor or pharmacy?: 1 - Never What is the last grade level you completed in school?: 12th grade  Interpreter Needed?: No  Comments: pt lives with spouse Information entered by :: LPinson, LPN  Past Medical History:  Diagnosis Date   CAP (community acquired pneumonia) 07/25/2017   Cataract    left eye   CHF (congestive heart failure) (HCC)    Complete heart block (HCC)    a. 1993 s/p PPM 2/2 perioperative heart block;  b. 01/2012 s/p SJM Accent DC PPM upgrade.   Depression    GERD (gastroesophageal reflux disease)    History of nephritis    a. as child.   History of pneumonia    Hyperlipidemia    Hypertension    NICM (nonischemic cardiomyopathy) (HCC)    a. 06/2012 Echo: EF 45-50%;  b.  08/2013 Echo: EF 20-25%;  c. 10/2013 Adenosine MV: Fixed septal defect likely representing LBBB-related artifact, no reversible ischemia, EF 43%, low risk.   Paroxysmal atrial tachycardia (HCC)    a. 07/2013 noted on device interrogation.   Presence of permanent cardiac pacemaker 2013   RA (rheumatoid arthritis) (HCC)    S/P aortic valve replacement    a. 1993 s/p SJM mechanical AVR for aortic stenosis - chronic coumadin.   Shortness of breath dyspnea    Past Surgical History:  Procedure Laterality Date   ANAL FISSURE REPAIR  1970s   AORTIC VALVE REPLACEMENT  1993   St. Jude Mechanical Valve   INSERT / REPLACE / REMOVE PACEMAKER     PACEMAKER INSERTION  1993   for AV block, not pacemaker dependant   PERMANENT PACEMAKER GENERATOR CHANGE N/A 01/25/2012   Procedure: PERMANENT PACEMAKER GENERATOR CHANGE;  Surgeon: [name], MD;  Location: [facility] CATH LAB;  Service: Cardiovascular;  Laterality: N/A;   Family History  Problem Relation Age of Onset   Arthritis Mother    Hypertension Mother    Arthritis Father    Heart disease Father    Prostate cancer Father 37   Hypertension Father    Congestive Heart Failure Father    Coronary artery disease Neg Hx    Stroke Neg Hx    Diabetes Neg Hx    Social History   Socioeconomic History   Marital status: Married  Spouse name: Not on file   Number of children: 1   Years of education: Not on file   Highest education level: Not on file  Occupational History   Occupation: retired  Ecologist strain: Not on file   Food insecurity:    Worry: Not on file    Inability: Not on file   Transportation needs:    Medical: Not on file    Non-medical: Not on file  Tobacco Use   Smoking status: Never Smoker   Smokeless tobacco: Never Used  Substance and Sexual Activity   Alcohol use: No   Drug use: No   Sexual activity: Not Currently  Lifestyle   Physical activity:    Days per week: Not  on file    Minutes per session: Not on file   Stress: Not on file  Relationships   Social connections:    Talks on phone: Not on file    Gets together: Not on file    Attends religious service: Not on file    Active member of club or organization: Not on file    Attends meetings of clubs or organizations: Not on file    Relationship status: Not on file  Other Topics Concern   Not on file  Social History Narrative   Lives with wife, 5 dogs.  grown children away.   Occupation: retired, was Teaching laboratory technician   Activity: welding   Diet: low sodium diet, good water, fruits/vegetables daily    Outpatient Encounter Medications as of 11/08/2018  Medication Sig   alum & mag hydroxide-simeth (MAALOX/MYLANTA) 200-200-20 MG/5ML suspension Take 15 mLs by mouth every 6 (six) hours as needed for indigestion or heartburn.   losartan (COZAAR) 50 MG tablet Take 1 tablet (50 mg total) by mouth daily.   metoprolol succinate (TOPROL-XL) 50 MG 24 hr tablet Take 1 tablet (50 mg total) by mouth daily.   omeprazole (PRILOSEC) 40 MG capsule Take 1 capsule (40 mg total) by mouth daily. Take daily for 3 days then as needed   warfarin (COUMADIN) 5 MG tablet TAKE AS DIRECTED BY  COUMADIN  CLINIC   [DISCONTINUED] chlorpheniramine-HYDROcodone (TUSSIONEX PENNKINETIC ER) 10-8 MG/5ML SUER Take 5 mLs by mouth at bedtime as needed for cough.   [DISCONTINUED] losartan (COZAAR) 100 MG tablet Take 1 tablet (100 mg total) by mouth daily.   [DISCONTINUED] ranitidine (ZANTAC) 150 MG capsule Take 150 mg by mouth daily as needed for heartburn.    No facility-administered encounter medications on file as of 11/08/2018.     Activities of Daily Living In your present state of health, do you have any difficulty performing the following activities: 11/08/2018  Hearing? N  Vision? N  Difficulty concentrating or making decisions? N  Walking or climbing stairs? N  Dressing or bathing? N  Doing errands, shopping? N  Preparing  Food and eating ? N  Using the Toilet? N  In the past six months, have you accidently leaked urine? N  Do you have problems with loss of bowel control? N  Managing your Medications? N  Managing your Finances? N  Housekeeping or managing your Housekeeping? N  Some recent data might be hidden    Patient Care Team: [name], MD as PCP - General (Family Medicine)   Assessment:   This is a routine wellness examination for Saban.  Vision Screening Comments: Last vision exam date unknown  Exercise Activities and Dietary recommendations Current Exercise Habits: Home exercise routine, Type of  exercise: walking, Time (Minutes): > 60, Frequency (Times/Week): 7, Weekly Exercise (Minutes/Week): 0, Intensity: Mild, Exercise limited by: None identified  Goals     Increase physical activity     Starting 11/08/2018, I will continue to walk at least 60 minutes daily.       Fall Risk Fall Risk  11/08/2018 07/27/2017 02/16/2016  Falls in the past year? 0 No No   Depression Screen PHQ 2/9 Scores 11/08/2018 07/27/2017 02/16/2016  PHQ - 2 Score 0 0 0  PHQ- 9 Score 0 0 -    Cognitive Function MMSE - Mini Mental State Exam 11/08/2018 07/27/2017  Orientation to time 5 5  Orientation to Place 5 5  Registration 3 3  Attention/ Calculation 0 0  Recall 3 3  Language- name 2 objects 0 0  Language- repeat 1 1  Language- follow 3 step command 0 3  Language- read & follow direction 0 0  Write a sentence 0 0  Copy design 0 0  Total score 17 20     PLEASE NOTE: A Mini-Cog screen was completed. Maximum score is 17. A value of 0 denotes this part of Folstein MMSE was not completed or the patient failed this part of the Mini-Cog screening.   Mini-Cog Screening Orientation to Time - Max 5 pts Orientation to Place - Max 5 pts Registration - Max 3 pts Recall - Max 3 pts Language Repeat - Max 1 pts      Immunization History  Administered Date(s) Administered   Influenza Whole 05/08/2005,  05/12/2009, 05/05/2010   Influenza, High Dose Seasonal PF 05/14/2017, 04/03/2018   Influenza, Seasonal, Injecte, Preservative Fre 07/05/2012   Influenza,inj,Quad PF,6+ Mos 07/24/2013, 04/15/2014, 06/06/2016   Pneumococcal Conjugate-13 02/16/2016    Screening Tests Health Maintenance  Topic Date Due   DTaP/Tdap/Td (1 - Tdap) 07/02/2020 (Originally 12/25/1959)   TETANUS/TDAP  07/02/2020 (Originally 12/25/1959)   PNA vac Low Risk Adult (2 of 2 - PPSV23) 07/02/2020 (Originally 02/15/2017)   INFLUENZA VACCINE  02/01/2019       Plan:     I have personally reviewed, addressed, and noted the following in the patients chart:  A. Medical and social history B. Use of alcohol, tobacco or illicit drugs  C. Current medications and supplements D. Functional ability and status E.  Nutritional status F.  Physical activity G. Advance directives H. List of other physicians I.  Hospitalizations, surgeries, and ER visits in previous 12 months J.  Vitals (unless it is a telemedicine encounter) K. Screenings to include cognitive, depression, hearing, vision (NOTE: hearing and vision screenings not completed in telemedicine encounter) L. Referrals and appointments   In addition, I have reviewed and discussed with patient certain preventive protocols, quality metrics, and best practice recommendations. A written personalized care plan for preventive services and recommendations were provided to patient.  Interactive audio and video telecommunications were attempted with patient. This attempt was unsuccessful due to patient having technical difficulties OR patient did not have access to video capability.  Encounter was completed with audio only.  Two patient identifiers were used to ensure the encounter occurred with the correct person.   Patient was in home and writer was in office.   Signed,   Randa Evens, MHA, BS, LPN Health Coach

## 2018-11-08 NOTE — Progress Notes (Signed)
PCP notes:   Health maintenance:  Tetanus vaccine - postponed/insurance PPSV23 - postponed  Abnormal screenings:   None  Patient concerns:   None  Nurse concerns:  None  Next PCP appt:   11/12/18 @ 1130

## 2018-11-08 NOTE — Patient Instructions (Signed)
Noah Thompson , Thank you for taking time to come for your Medicare Wellness Visit. I appreciate your ongoing commitment to your health goals. Please review the following plan we discussed and let me know if I can assist you in the future.   These are the goals we discussed: Goals    . Increase physical activity     Starting 11/08/2018, I will continue to walk at least 60 minutes daily.       This is a list of the screening recommended for you and due dates:  Health Maintenance  Topic Date Due  . DTaP/Tdap/Td vaccine (1 - Tdap) 07/02/2020*  . Tetanus Vaccine  07/02/2020*  . Pneumonia vaccines (2 of 2 - PPSV23) 07/02/2020*  . Flu Shot  02/01/2019  *Topic was postponed. The date shown is not the original due date.   Preventive Care for Adults  A healthy lifestyle and preventive care can promote health and wellness. Preventive health guidelines for adults include the following key practices.  . A routine yearly physical is a good way to check with your health care provider about your health and preventive screening. It is a chance to share any concerns and updates on your health and to receive a thorough exam.  . Visit your dentist for a routine exam and preventive care every 6 months. Brush your teeth twice a day and floss once a day. Good oral hygiene prevents tooth decay and gum disease.  . The frequency of eye exams is based on your age, health, family medical history, use  of contact lenses, and other factors. Follow your health care provider's recommendations for frequency of eye exams.  . Eat a healthy diet. Foods like vegetables, fruits, whole grains, low-fat dairy products, and lean protein foods contain the nutrients you need without too many calories. Decrease your intake of foods high in solid fats, added sugars, and salt. Eat the right amount of calories for you. Get information about a proper diet from your health care provider, if necessary.  . Regular physical exercise is  one of the most important things you can do for your health. Most adults should get at least 150 minutes of moderate-intensity exercise (any activity that increases your heart rate and causes you to sweat) each week. In addition, most adults need muscle-strengthening exercises on 2 or more days a week.  Silver Sneakers may be a benefit available to you. To determine eligibility, you may visit the website: www.silversneakers.com or contact program at 667-570-6503 Mon-Fri between 8AM-8PM.   . Maintain a healthy weight. The body mass index (BMI) is a screening tool to identify possible weight problems. It provides an estimate of body fat based on height and weight. Your health care provider can find your BMI and can help you achieve or maintain a healthy weight.   For adults 20 years and older: ? A BMI below 18.5 is considered underweight. ? A BMI of 18.5 to 24.9 is normal. ? A BMI of 25 to 29.9 is considered overweight. ? A BMI of 30 and above is considered obese.   . Maintain normal blood lipids and cholesterol levels by exercising and minimizing your intake of saturated fat. Eat a balanced diet with plenty of fruit and vegetables. Blood tests for lipids and cholesterol should begin at age 73 and be repeated every 5 years. If your lipid or cholesterol levels are high, you are over 50, or you are at high risk for heart disease, you may need your cholesterol levels  checked more frequently. Ongoing high lipid and cholesterol levels should be treated with medicines if diet and exercise are not working.  . If you smoke, find out from your health care provider how to quit. If you do not use tobacco, please do not start.  . If you choose to drink alcohol, please do not consume more than 2 drinks per day. One drink is considered to be 12 ounces (355 mL) of beer, 5 ounces (148 mL) of wine, or 1.5 ounces (44 mL) of liquor.  . If you are 64-59 years old, ask your health care provider if you should take  aspirin to prevent strokes.  . Use sunscreen. Apply sunscreen liberally and repeatedly throughout the day. You should seek shade when your shadow is shorter than you. Protect yourself by wearing long sleeves, pants, a wide-brimmed hat, and sunglasses year round, whenever you are outdoors.  . Once a month, do a whole body skin exam, using a mirror to look at the skin on your back. Tell your health care provider of new moles, moles that have irregular borders, moles that are larger than a pencil eraser, or moles that have changed in shape or color.

## 2018-11-08 NOTE — Progress Notes (Signed)
Remote pacemaker transmission.   

## 2018-11-12 ENCOUNTER — Ambulatory Visit (INDEPENDENT_AMBULATORY_CARE_PROVIDER_SITE_OTHER): Payer: Medicare Other | Admitting: Family Medicine

## 2018-11-12 ENCOUNTER — Encounter: Payer: Self-pay | Admitting: Family Medicine

## 2018-11-12 VITALS — Temp 97.6°F | Ht 69.0 in | Wt 171.0 lb

## 2018-11-12 DIAGNOSIS — Z7189 Other specified counseling: Secondary | ICD-10-CM

## 2018-11-12 DIAGNOSIS — I5032 Chronic diastolic (congestive) heart failure: Secondary | ICD-10-CM

## 2018-11-12 DIAGNOSIS — M109 Gout, unspecified: Secondary | ICD-10-CM

## 2018-11-12 DIAGNOSIS — R972 Elevated prostate specific antigen [PSA]: Secondary | ICD-10-CM

## 2018-11-12 DIAGNOSIS — E782 Mixed hyperlipidemia: Secondary | ICD-10-CM

## 2018-11-12 DIAGNOSIS — I10 Essential (primary) hypertension: Secondary | ICD-10-CM

## 2018-11-12 DIAGNOSIS — N183 Chronic kidney disease, stage 3 unspecified: Secondary | ICD-10-CM

## 2018-11-12 DIAGNOSIS — Z7901 Long term (current) use of anticoagulants: Secondary | ICD-10-CM

## 2018-11-12 DIAGNOSIS — Z0000 Encounter for general adult medical examination without abnormal findings: Secondary | ICD-10-CM

## 2018-11-12 DIAGNOSIS — I428 Other cardiomyopathies: Secondary | ICD-10-CM

## 2018-11-12 DIAGNOSIS — K219 Gastro-esophageal reflux disease without esophagitis: Secondary | ICD-10-CM

## 2018-11-12 DIAGNOSIS — Z952 Presence of prosthetic heart valve: Secondary | ICD-10-CM

## 2018-11-12 NOTE — Assessment & Plan Note (Signed)
Level has returned to normal. We are checking due to fmhx prostate cancer in father at age 78.

## 2018-11-12 NOTE — Assessment & Plan Note (Signed)
Reviewed chronic kidney disease with patient, encouraged good hydration status.

## 2018-11-12 NOTE — Assessment & Plan Note (Signed)
Chronic off statins. Deteriorated control over the past year - pt attributes to dietary indiscretions. Motivated to return to healthier diet.  The 10-year ASCVD risk score Denman George DC Montez Hageman., et al., 2013) is: 32.6%   Values used to calculate the score:     Age: 78 years     Sex: Male     Is Non-Hispanic African American: No     Diabetic: No     Tobacco smoker: No     Systolic Blood Pressure: 126 mmHg     Is BP treated: Yes     HDL Cholesterol: 44.1 mg/dL     Total Cholesterol: 217 mg/dL

## 2018-11-12 NOTE — Assessment & Plan Note (Signed)
Preventative protocols reviewed and updated unless pt declined. Discussed healthy diet and lifestyle.  

## 2018-11-12 NOTE — Assessment & Plan Note (Signed)
Followed by cardiology 

## 2018-11-12 NOTE — Assessment & Plan Note (Signed)
No recent gout flares. Reviewed low purine diet.

## 2018-11-12 NOTE — Assessment & Plan Note (Signed)
Advanced directive discussion -has not set up. Would want wife to be HCPOA. Packet provided previously

## 2018-11-12 NOTE — Assessment & Plan Note (Signed)
Chronic, anticipate good control however his home cuff doesn't work. He will buy new cuff. I will ask him to come in for nurse visit.

## 2018-11-12 NOTE — Progress Notes (Signed)
Virtual visit completed through [employer]. Due to national recommendations of social distancing due to COVID 19, a virtual visit is felt to be most appropriate for this patient at this time.   Patient location: home Provider [facility] [place] [facility] If any vitals were documented, they were collected by patient at home unless specified below.    Temp 97.6 F (36.4 C) (Oral)   Ht 5\' 9"  (1.753 m)   Wt 171 lb (77.6 kg)   BMI 25.25 kg/m    CC: CPE Subjective:    Patient ID: Noah Thompson, male    DOB: 01/17/1941, 78 y.o.   MRN: [id number]  HPI: Noah Thompson is a 78 y.o. male presenting on 11/12/2018 for Annual Exam (Pt 2.)   Saw Virl AxeLesia last week for medicare wellness visit. Note reviewed.   GERD - controlled with occasional PPI.  Eosinophils elevated - attributed to increased spring allergies. Manages allergies with claritin/benadryl He will buy new BP cuff to monitor pressures at home.   Preventative: Colon cancer screening -cologuard negative 05/2016 [facility] today given fmhx (father age 78) Lung cancer screening -never smoker Flu shot yearly Tetanus shot -?declines [place] [facility] [place]. Prevnar 02/2016.  Shingrix -discussed, declines. Advanced directive discussion -has not set up. Would want wife to be HCPOA. Packet provided previously  Seat belt use discussed Sunscreen use discussed.No changing moles on skin.  Non smoker Alcohol - none Dentist yearly Eye exam yearly (due) Bowel - no constipation Bladder - no incontinence  Lives with wife, 5 dogs. Grown children away. Occupation: retired, was Teaching laboratory techniciancar mechanic Activity: welding  Diet: low sodium diet, good water, fruits/vegetables daily     Relevant past medical, surgical, family and social history reviewed and updated as indicated. Interim medical history since our last visit reviewed. Allergies and medications reviewed and  updated. Outpatient Medications Prior to Visit  Medication Sig Dispense Refill  . alum & mag hydroxide-simeth (MAALOX/MYLANTA) 200-200-20 MG/5ML suspension Take 15 mLs by mouth every 6 (six) hours as needed for indigestion or heartburn.    . losartan (COZAAR) 50 MG tablet Take 1 tablet (50 mg total) by mouth daily. 90 tablet 1  . metoprolol succinate (TOPROL-XL) 50 MG 24 hr tablet Take 1 tablet (50 mg total) by mouth daily. 90 tablet 3  . omeprazole (PRILOSEC) 40 MG capsule Take 1 capsule (40 mg total) by mouth daily. Take daily for 3 days then as needed 30 capsule 1  . warfarin (COUMADIN) 5 MG tablet TAKE AS DIRECTED BY  COUMADIN  CLINIC 80 tablet 1   No facility-administered medications prior to visit.      Per HPI unless specifically indicated in ROS section below Review of Systems  Constitutional: Negative for activity change, appetite change, chills, fatigue, fever and unexpected weight change.  HENT: Negative for hearing loss.   Eyes: Negative for visual disturbance.  Respiratory: Negative for cough, chest tightness, shortness of breath and wheezing.   Cardiovascular: Negative for chest pain, palpitations and leg swelling.  Gastrointestinal: Negative for abdominal distention, abdominal pain, blood in stool, constipation, diarrhea, nausea and vomiting.  Genitourinary: Negative for difficulty urinating and hematuria.  Musculoskeletal: Negative for arthralgias, myalgias and neck pain.  Skin: Negative for rash.  Neurological: Negative for dizziness, seizures, syncope and headaches.  Hematological: Negative for adenopathy. Does not bruise/bleed easily.  Psychiatric/Behavioral: Negative for dysphoric mood. The patient is not nervous/anxious.    Objective:    Temp 97.6  F (36.4 C) (Oral)   Ht 5\' 9"  (1.753 m)   Wt 171 lb (77.6 kg)   BMI 25.25 kg/m   Wt Readings from Last 3 Encounters:  11/12/18 171 lb (77.6 kg)  04/03/18 174 lb 12.8 oz (79.3 kg)  08/03/17 165 lb (74.8 kg)      Physical exam: Gen: alert, NAD, not ill appearing Pulm: speaks in complete sentences without increased work of breathing Psych: normal mood, normal thought content      Results for orders placed or performed in visit on 11/07/18  Uric acid  Result Value Ref Range   Uric Acid, Serum 7.5 4.0 - 7.8 mg/dL  PSA, Total with Reflex to PSA, Free  Result Value Ref Range   PSA, Total 2.7 < OR = 4.0 ng/mL  CBC with Differential/Platelet  Result Value Ref Range   WBC 7.7 4.0 - 10.5 K/uL   RBC 4.80 4.22 - 5.81 Mil/uL   Hemoglobin 14.4 13.0 - 17.0 g/dL   HCT 54.6 56.8 - 12.7 %   MCV 87.8 78.0 - 100.0 fl   MCHC 34.2 30.0 - 36.0 g/dL   RDW 51.7 00.1 - 74.9 %   Platelets 238.0 150.0 - 400.0 K/uL   Neutrophils Relative % 54.9 43.0 - 77.0 %   Lymphocytes Relative 25.2 12.0 - 46.0 %   Monocytes Relative 8.5 3.0 - 12.0 %   Eosinophils Relative 10.5 (H) 0.0 - 5.0 %   Basophils Relative 0.9 0.0 - 3.0 %   Neutro Abs 4.2 1.4 - 7.7 K/uL   Lymphs Abs 1.9 0.7 - 4.0 K/uL   Monocytes Absolute 0.6 0.1 - 1.0 K/uL   Eosinophils Absolute 0.8 (H) 0.0 - 0.7 K/uL   Basophils Absolute 0.1 0.0 - 0.1 K/uL  TSH  Result Value Ref Range   TSH 1.81 0.35 - 4.50 uIU/mL  Comprehensive metabolic panel  Result Value Ref Range   Sodium 141 135 - 145 mEq/L   Potassium 4.4 3.5 - 5.1 mEq/L   Chloride 103 96 - 112 mEq/L   CO2 30 19 - 32 mEq/L   Glucose, Bld 90 70 - 99 mg/dL   BUN 22 6 - 23 mg/dL   Creatinine, Ser 4.49 0.40 - 1.50 mg/dL   Total Bilirubin 0.6 0.2 - 1.2 mg/dL   Alkaline Phosphatase 75 39 - 117 U/L   AST 9 0 - 37 U/L   ALT 16 0 - 53 U/L   Total Protein 6.7 6.0 - 8.3 g/dL   Albumin 4.4 3.5 - 5.2 g/dL   Calcium 9.5 8.4 - 67.5 mg/dL   GFR 91.63 (L) >84.66 mL/min  Lipid panel  Result Value Ref Range   Cholesterol 217 (H) 0 - 200 mg/dL   Triglycerides 599.3 (H) 0.0 - 149.0 mg/dL   HDL 57.01 >77.93 mg/dL   VLDL 90.3 0.0 - 00.9 mg/dL   LDL Cholesterol 233 (H) 0 - 99 mg/dL   Total CHOL/HDL Ratio 5     NonHDL 172.99    Assessment & Plan:  Pt will call back to schedule CPE/AMW for next year. Will sign up for cologuard this fall.  Problem List Items Addressed This Visit    S/P aortic valve replacement   Podagra    No recent gout flares. Reviewed low purine diet.       Nonischemic cardiomyopathy (HCC)    Followed by cardiology.       Long term current use of anticoagulant therapy   HLD (hyperlipidemia)    Chronic  off statins. Deteriorated control over the past year - pt attributes to dietary indiscretions. Motivated to return to healthier diet.  The 10-year ASCVD risk score Denman George DC Montez Hageman., et al., 2013) is: 32.6%   Values used to calculate the score:     Age: 3 years     Sex: Male     Is Non-Hispanic African American: No     Diabetic: No     Tobacco smoker: No     Systolic Blood Pressure: 126 mmHg     Is BP treated: Yes     HDL Cholesterol: 44.1 mg/dL     Total Cholesterol: 217 mg/dL       Health maintenance examination - Primary    Preventative protocols reviewed and updated unless pt declined. Discussed healthy diet and lifestyle.       GERD (gastroesophageal reflux disease)    Managed with PRN PPI      Essential hypertension, benign    Chronic, anticipate good control however his home cuff doesn't work. He will buy new cuff. I will ask him to come in for nurse visit.       Elevated PSA    Level has returned to normal. We are checking due to fmhx prostate cancer in father at age 21.       CKD (chronic kidney disease) stage 3, GFR 30-59 ml/min (HCC)    Reviewed chronic kidney disease with patient, encouraged good hydration status.       Advanced care planning/counseling discussion    Advanced directive discussion -has not set up. Would want wife to be HCPOA. Packet provided previously        (HFpEF) heart failure with preserved ejection fraction (HCC)       No orders of the defined types were placed in this encounter.  No orders of the defined types were  placed in this encounter.   Follow up plan: Return in about 1 year (around 11/12/2019) for annual exam, prior fasting for blood work.  [name], MD

## 2018-11-12 NOTE — Assessment & Plan Note (Signed)
Managed with PRN PPI 

## 2018-11-13 ENCOUNTER — Ambulatory Visit (INDEPENDENT_AMBULATORY_CARE_PROVIDER_SITE_OTHER): Payer: Medicare Other

## 2018-11-13 VITALS — BP 132/70 | HR 53 | Temp 97.7°F | Resp 16

## 2018-11-13 DIAGNOSIS — I10 Essential (primary) hypertension: Secondary | ICD-10-CM

## 2018-11-13 NOTE — Progress Notes (Signed)
Per Dr. Sharen Hones encounter order on 11/12/18, patient presents today for a nurse visit blood pressure check for ongoing follow up and management.  Vital Sign Readings today temp 97.7, bp 132/70, HR 53, 02sat 97% on RA.  Patient is to continue current medication management and plan per Dr. Sharen Hones orders on 11/12/18.  Note: patient states that Dr. Johney Frame recently "turned down his pacemaker" which likely is contributing to his lower readings.  He will follow up with Dr. Johney Frame as needed.

## 2018-11-27 ENCOUNTER — Other Ambulatory Visit: Payer: Self-pay | Admitting: Family Medicine

## 2018-11-27 NOTE — Telephone Encounter (Signed)
Patient is compliant with coumadin management will refill x 6 months.  

## 2018-12-12 ENCOUNTER — Other Ambulatory Visit: Payer: Medicare Other

## 2018-12-19 ENCOUNTER — Other Ambulatory Visit: Payer: Medicare Other

## 2018-12-19 ENCOUNTER — Ambulatory Visit (INDEPENDENT_AMBULATORY_CARE_PROVIDER_SITE_OTHER): Payer: Medicare Other | Admitting: General Practice

## 2018-12-19 ENCOUNTER — Other Ambulatory Visit: Payer: Self-pay

## 2018-12-19 DIAGNOSIS — Z7901 Long term (current) use of anticoagulants: Secondary | ICD-10-CM

## 2018-12-19 LAB — POCT INR: INR: 2.4 (ref 2.0–3.0)

## 2018-12-19 NOTE — Patient Instructions (Addendum)
Pre visit review using our clinic review tool, if applicable. No additional management support is needed unless otherwise documented below in the visit note.  Take 1 1/2 tablets today (6/18) and then continue taking 5mg  (1 pill) on Mondays, Thursdays and Saturdays and 2.5mg  (1/2pill) on Sun, Tues, Wed, Fri.  Continue e-check in 4 to 5 weeks.  He will continue his normal bi-weekly spinach green consumption.  Scheduled pt for the lab.

## 2019-01-06 NOTE — Progress Notes (Signed)
I reviewed health advisor's note, was available for consultation, and agree with documentation and plan.  

## 2019-01-16 ENCOUNTER — Ambulatory Visit (INDEPENDENT_AMBULATORY_CARE_PROVIDER_SITE_OTHER): Payer: Medicare Other | Admitting: General Practice

## 2019-01-16 DIAGNOSIS — Z7901 Long term (current) use of anticoagulants: Secondary | ICD-10-CM

## 2019-01-16 LAB — POCT INR: INR: 2.5 (ref 2.0–3.0)

## 2019-01-16 NOTE — Patient Instructions (Addendum)
Pre visit review using our clinic review tool, if applicable. No additional management support is needed unless otherwise documented below in the visit note.  Continue taking 5mg (1 pill) on Mondays, Thursdays and Saturdays and 2.5mg (1/2pill) on Sun, Tues, Wed, Fri.  Continue e-check in 4 to 5 weeks.    

## 2019-01-29 ENCOUNTER — Other Ambulatory Visit: Payer: Self-pay | Admitting: Family Medicine

## 2019-01-29 ENCOUNTER — Ambulatory Visit (INDEPENDENT_AMBULATORY_CARE_PROVIDER_SITE_OTHER): Payer: Medicare Other | Admitting: *Deleted

## 2019-01-29 DIAGNOSIS — I428 Other cardiomyopathies: Secondary | ICD-10-CM

## 2019-01-29 DIAGNOSIS — I441 Atrioventricular block, second degree: Secondary | ICD-10-CM

## 2019-01-29 LAB — CUP PACEART REMOTE DEVICE CHECK
Battery Remaining Longevity: 44 mo
Battery Remaining Percentage: 51 %
Battery Voltage: 2.87 V
Brady Statistic AP VP Percent: 1 %
Brady Statistic AP VS Percent: 19 %
Brady Statistic AS VP Percent: 1 %
Brady Statistic AS VS Percent: 80 %
Brady Statistic RA Percent Paced: 17 %
Brady Statistic RV Percent Paced: 1 %
Date Time Interrogation Session: 20200729060033
Implantable Lead Implant Date: 19930223
Implantable Lead Implant Date: 19930223
Implantable Lead Location: 753859
Implantable Lead Location: 753860
Implantable Pulse Generator Implant Date: 20130725
Lead Channel Impedance Value: 530 Ohm
Lead Channel Impedance Value: 530 Ohm
Lead Channel Pacing Threshold Amplitude: 1 V
Lead Channel Pacing Threshold Amplitude: 2.75 V
Lead Channel Pacing Threshold Pulse Width: 0.5 ms
Lead Channel Pacing Threshold Pulse Width: 1 ms
Lead Channel Sensing Intrinsic Amplitude: 11.4 mV
Lead Channel Sensing Intrinsic Amplitude: 3.6 mV
Lead Channel Setting Pacing Amplitude: 2.5 V
Lead Channel Setting Pacing Amplitude: 5 V
Lead Channel Setting Pacing Pulse Width: 0.5 ms
Lead Channel Setting Sensing Sensitivity: 2 mV
Pulse Gen Model: 2210
Pulse Gen Serial Number: 7364115

## 2019-02-06 NOTE — Progress Notes (Signed)
Remote pacemaker transmission.   

## 2019-02-11 DIAGNOSIS — H25812 Combined forms of age-related cataract, left eye: Secondary | ICD-10-CM | POA: Diagnosis not present

## 2019-02-11 DIAGNOSIS — H2511 Age-related nuclear cataract, right eye: Secondary | ICD-10-CM | POA: Diagnosis not present

## 2019-02-11 DIAGNOSIS — E119 Type 2 diabetes mellitus without complications: Secondary | ICD-10-CM | POA: Diagnosis not present

## 2019-02-12 ENCOUNTER — Telehealth: Payer: Self-pay | Admitting: Family Medicine

## 2019-02-12 NOTE — Telephone Encounter (Signed)
[name] called and wanted to reschedule coumadin for tomorrow 02/13/19. I rescheduled appointment to 02/20/19 @ 2:30, did covid screen, and gave him number to call when he arrives.

## 2019-02-13 ENCOUNTER — Ambulatory Visit: Payer: Medicare Other

## 2019-02-13 NOTE — Telephone Encounter (Signed)
Noted, thank you

## 2019-02-20 ENCOUNTER — Ambulatory Visit (INDEPENDENT_AMBULATORY_CARE_PROVIDER_SITE_OTHER): Payer: Medicare Other | Admitting: General Practice

## 2019-02-20 DIAGNOSIS — Z7901 Long term (current) use of anticoagulants: Secondary | ICD-10-CM

## 2019-02-20 LAB — POCT INR: INR: 3.2 — AB (ref 2.0–3.0)

## 2019-02-20 NOTE — Patient Instructions (Addendum)
Pre visit review using our clinic review tool, if applicable. No additional management support is needed unless otherwise documented below in the visit note.  Continue taking 5mg  (1 pill) on Mondays, Thursdays and Saturdays and 2.5mg  (1/2pill) on Sun, Tues, Wed, Fri.  Continue e-check in 4 to 5 weeks.

## 2019-02-27 DIAGNOSIS — H25812 Combined forms of age-related cataract, left eye: Secondary | ICD-10-CM | POA: Diagnosis not present

## 2019-02-27 DIAGNOSIS — H2512 Age-related nuclear cataract, left eye: Secondary | ICD-10-CM | POA: Diagnosis not present

## 2019-03-06 DIAGNOSIS — H2511 Age-related nuclear cataract, right eye: Secondary | ICD-10-CM | POA: Diagnosis not present

## 2019-03-06 DIAGNOSIS — H2512 Age-related nuclear cataract, left eye: Secondary | ICD-10-CM | POA: Diagnosis not present

## 2019-03-06 DIAGNOSIS — H25811 Combined forms of age-related cataract, right eye: Secondary | ICD-10-CM | POA: Diagnosis not present

## 2019-04-03 ENCOUNTER — Ambulatory Visit (INDEPENDENT_AMBULATORY_CARE_PROVIDER_SITE_OTHER): Payer: Medicare Other | Admitting: General Practice

## 2019-04-03 DIAGNOSIS — Z23 Encounter for immunization: Secondary | ICD-10-CM | POA: Diagnosis not present

## 2019-04-03 DIAGNOSIS — Z7901 Long term (current) use of anticoagulants: Secondary | ICD-10-CM

## 2019-04-03 LAB — POCT INR: INR: 2.9 (ref 2.0–3.0)

## 2019-04-03 NOTE — Patient Instructions (Addendum)
Pre visit review using our clinic review tool, if applicable. No additional management support is needed unless otherwise documented below in the visit note.  Continue taking 5mg  (1 pill) on Mondays, Thursdays and Saturdays and 2.5mg  (1/2pill) on Sun, Tues, Wed, Fri.  Continue e-check in 6 weeks.

## 2019-04-30 LAB — CUP PACEART REMOTE DEVICE CHECK
Battery Remaining Longevity: 38 mo
Battery Remaining Percentage: 46 %
Battery Voltage: 2.86 V
Brady Statistic AP VP Percent: 1 %
Brady Statistic AP VS Percent: 20 %
Brady Statistic AS VP Percent: 1 %
Brady Statistic AS VS Percent: 79 %
Brady Statistic RA Percent Paced: 18 %
Brady Statistic RV Percent Paced: 1 %
Date Time Interrogation Session: 20201028060346
Implantable Lead Implant Date: 19930223
Implantable Lead Implant Date: 19930223
Implantable Lead Location: 753859
Implantable Lead Location: 753860
Implantable Pulse Generator Implant Date: 20130725
Lead Channel Impedance Value: 490 Ohm
Lead Channel Impedance Value: 530 Ohm
Lead Channel Pacing Threshold Amplitude: 1 V
Lead Channel Pacing Threshold Amplitude: 2.75 V
Lead Channel Pacing Threshold Pulse Width: 0.5 ms
Lead Channel Pacing Threshold Pulse Width: 1 ms
Lead Channel Sensing Intrinsic Amplitude: 12 mV
Lead Channel Sensing Intrinsic Amplitude: 4.3 mV
Lead Channel Setting Pacing Amplitude: 2.5 V
Lead Channel Setting Pacing Amplitude: 5 V
Lead Channel Setting Pacing Pulse Width: 0.5 ms
Lead Channel Setting Sensing Sensitivity: 2 mV
Pulse Gen Model: 2210
Pulse Gen Serial Number: 7364115

## 2019-05-01 ENCOUNTER — Ambulatory Visit (INDEPENDENT_AMBULATORY_CARE_PROVIDER_SITE_OTHER): Payer: Medicare Other | Admitting: *Deleted

## 2019-05-01 DIAGNOSIS — I428 Other cardiomyopathies: Secondary | ICD-10-CM

## 2019-05-01 DIAGNOSIS — I441 Atrioventricular block, second degree: Secondary | ICD-10-CM

## 2019-05-03 ENCOUNTER — Other Ambulatory Visit: Payer: Self-pay | Admitting: Internal Medicine

## 2019-05-15 ENCOUNTER — Ambulatory Visit (INDEPENDENT_AMBULATORY_CARE_PROVIDER_SITE_OTHER): Payer: Medicare Other | Admitting: General Practice

## 2019-05-15 ENCOUNTER — Other Ambulatory Visit: Payer: Self-pay

## 2019-05-15 DIAGNOSIS — Z7901 Long term (current) use of anticoagulants: Secondary | ICD-10-CM

## 2019-05-15 LAB — POCT INR: INR: 2.3 (ref 2.0–3.0)

## 2019-05-15 NOTE — Patient Instructions (Signed)
Pre visit review using our clinic review tool, if applicable. No additional management support is needed unless otherwise documented below in the visit note.  Take 1 1/2 tablets today (11/12) and then continue taking 5mg  (1 pill) on Mondays, Thursdays and Saturdays and 2.5mg  (1/2pill) on Sun, Tues, Wed, Fri.  Continue e-check in 4 weeks.

## 2019-05-22 ENCOUNTER — Telehealth: Payer: Self-pay | Admitting: Family Medicine

## 2019-05-22 NOTE — Telephone Encounter (Signed)
plz sign patient up for Cologuard per his request at CPE 11/2018

## 2019-05-22 NOTE — Telephone Encounter (Signed)
-----   Message from Ria Bush, MD sent at 11/12/2018 11:53 AM EDT ----- Due for cologuard 05/2019. Pt desires this

## 2019-05-22 NOTE — Telephone Encounter (Signed)
Faxed order

## 2019-05-23 NOTE — Progress Notes (Signed)
Remote pacemaker transmission.   

## 2019-06-12 ENCOUNTER — Ambulatory Visit (INDEPENDENT_AMBULATORY_CARE_PROVIDER_SITE_OTHER): Payer: Medicare Other | Admitting: General Practice

## 2019-06-12 ENCOUNTER — Other Ambulatory Visit: Payer: Self-pay

## 2019-06-12 DIAGNOSIS — Z7901 Long term (current) use of anticoagulants: Secondary | ICD-10-CM

## 2019-06-12 LAB — POCT INR: INR: 1.8 — AB (ref 2.0–3.0)

## 2019-06-12 NOTE — Patient Instructions (Addendum)
Pre visit review using our clinic review tool, if applicable. No additional management support is needed unless otherwise documented below in the visit note.  Take 1 1/2 tablets today (12/10), take 1 tablet tomorrow (12/11) and take 1 1/2 tablets on Saturday, (12/12).  On Sunday start taking 1 tablet daily except 1/2 tablet on Tuesdays and Fridays.  Re-check in 3 to 4 weeks.

## 2019-07-09 ENCOUNTER — Other Ambulatory Visit: Payer: Self-pay | Admitting: Family Medicine

## 2019-07-10 ENCOUNTER — Other Ambulatory Visit: Payer: Self-pay

## 2019-07-10 ENCOUNTER — Ambulatory Visit (INDEPENDENT_AMBULATORY_CARE_PROVIDER_SITE_OTHER): Payer: Medicare Other | Admitting: General Practice

## 2019-07-10 DIAGNOSIS — Z7901 Long term (current) use of anticoagulants: Secondary | ICD-10-CM

## 2019-07-10 LAB — POCT INR: INR: 3.6 — AB (ref 2.0–3.0)

## 2019-07-10 NOTE — Patient Instructions (Addendum)
Pre visit review using our clinic review tool, if applicable. No additional management support is needed unless otherwise documented below in the visit note.  Skip coumadin today and then continue to take 1 tablet daily except 1/2 tablet on Tuesdays and Fridays.  Re-check in to 4 weeks.

## 2019-07-30 ENCOUNTER — Other Ambulatory Visit: Payer: Self-pay | Admitting: Internal Medicine

## 2019-07-31 ENCOUNTER — Ambulatory Visit (INDEPENDENT_AMBULATORY_CARE_PROVIDER_SITE_OTHER): Payer: Medicare Other | Admitting: *Deleted

## 2019-07-31 DIAGNOSIS — I428 Other cardiomyopathies: Secondary | ICD-10-CM | POA: Diagnosis not present

## 2019-07-31 LAB — CUP PACEART REMOTE DEVICE CHECK
Battery Remaining Longevity: 38 mo
Battery Remaining Percentage: 46 %
Battery Voltage: 2.86 V
Brady Statistic AP VP Percent: 1 %
Brady Statistic AP VS Percent: 20 %
Brady Statistic AS VP Percent: 1 %
Brady Statistic AS VS Percent: 78 %
Brady Statistic RA Percent Paced: 18 %
Brady Statistic RV Percent Paced: 1 %
Date Time Interrogation Session: 20210128033432
Implantable Lead Implant Date: 19930223
Implantable Lead Implant Date: 19930223
Implantable Lead Location: 753859
Implantable Lead Location: 753860
Implantable Pulse Generator Implant Date: 20130725
Lead Channel Impedance Value: 530 Ohm
Lead Channel Impedance Value: 530 Ohm
Lead Channel Pacing Threshold Amplitude: 1 V
Lead Channel Pacing Threshold Amplitude: 2.75 V
Lead Channel Pacing Threshold Pulse Width: 0.5 ms
Lead Channel Pacing Threshold Pulse Width: 1 ms
Lead Channel Sensing Intrinsic Amplitude: 12 mV
Lead Channel Sensing Intrinsic Amplitude: 4.9 mV
Lead Channel Setting Pacing Amplitude: 2.5 V
Lead Channel Setting Pacing Amplitude: 5 V
Lead Channel Setting Pacing Pulse Width: 0.5 ms
Lead Channel Setting Sensing Sensitivity: 2 mV
Pulse Gen Model: 2210
Pulse Gen Serial Number: 7364115

## 2019-07-31 NOTE — Progress Notes (Signed)
PPM Remote  

## 2019-08-07 ENCOUNTER — Ambulatory Visit (INDEPENDENT_AMBULATORY_CARE_PROVIDER_SITE_OTHER): Payer: Medicare Other

## 2019-08-07 ENCOUNTER — Other Ambulatory Visit: Payer: Self-pay

## 2019-08-07 DIAGNOSIS — Z7901 Long term (current) use of anticoagulants: Secondary | ICD-10-CM | POA: Diagnosis not present

## 2019-08-07 LAB — POCT INR: INR: 3.6 — AB (ref 2.0–3.0)

## 2019-08-07 NOTE — Patient Instructions (Addendum)
Pre visit review using our clinic review tool, if applicable. No additional management support is needed unless otherwise documented below in the visit note.  Skip coumadin today and then change weekly dosing of 2.5mg  M, W, F, then 5mg  all other days.   Re-check in to 4 weeks.

## 2019-08-10 NOTE — Progress Notes (Signed)
Agree. Thanks

## 2019-09-02 ENCOUNTER — Ambulatory Visit: Payer: Medicare Other

## 2019-09-03 ENCOUNTER — Telehealth: Payer: Self-pay

## 2019-09-03 NOTE — Telephone Encounter (Signed)
Pt cancelled apt for coumadin management yesterday.  LVM for pt to call to RS.

## 2019-09-10 NOTE — Telephone Encounter (Signed)
Left another VM. Pt needs to be scheduled for coumadin clinic apt due to missing apt on 3/2.

## 2019-09-12 NOTE — Telephone Encounter (Signed)
LVM on home and cell number. 

## 2019-09-16 NOTE — Telephone Encounter (Signed)
LVM on both numbers 

## 2019-09-24 NOTE — Telephone Encounter (Signed)
LMV. Pt need coumadin clinic apt.

## 2019-10-02 NOTE — Telephone Encounter (Signed)
LVM for pt and on pt's daughter's phone, Ginger. Also mailed letter. Pt needs apt for coumadin clinic since he NS apt on 3/2

## 2019-10-21 ENCOUNTER — Ambulatory Visit (INDEPENDENT_AMBULATORY_CARE_PROVIDER_SITE_OTHER): Payer: Medicare Other

## 2019-10-21 ENCOUNTER — Other Ambulatory Visit: Payer: Self-pay

## 2019-10-21 DIAGNOSIS — Z7901 Long term (current) use of anticoagulants: Secondary | ICD-10-CM | POA: Diagnosis not present

## 2019-10-21 LAB — POCT INR: INR: 3.2 — AB (ref 2.0–3.0)

## 2019-10-21 NOTE — Patient Instructions (Addendum)
Pre visit review using our clinic review tool, if applicable. No additional management support is needed unless otherwise documented below in the visit note. ° °Continue taking 2.5 mg M, W, F, and take 5mg T, TH, Sat, Sun.   Re-check in to 4 weeks. °

## 2019-10-21 NOTE — Telephone Encounter (Signed)
Contacted pt's wife, Dorothyann Gibbs, who reports the  Pt has an apt for today and she will do her best to get him there today. She reports pt has not been wanting to go to make apt. Advised the importance of coumadin management. Dorothyann Gibbs verbalized understanding and reported she has been telling him the importance.

## 2019-10-28 ENCOUNTER — Other Ambulatory Visit: Payer: Self-pay | Admitting: Family Medicine

## 2019-10-28 ENCOUNTER — Other Ambulatory Visit: Payer: Self-pay | Admitting: Internal Medicine

## 2019-10-28 DIAGNOSIS — Z7901 Long term (current) use of anticoagulants: Secondary | ICD-10-CM

## 2019-10-28 NOTE — Telephone Encounter (Signed)
Pt is compliant with coumadin management. Sent in script 

## 2019-10-30 ENCOUNTER — Ambulatory Visit (INDEPENDENT_AMBULATORY_CARE_PROVIDER_SITE_OTHER): Payer: Medicare Other | Admitting: *Deleted

## 2019-10-30 DIAGNOSIS — I428 Other cardiomyopathies: Secondary | ICD-10-CM | POA: Diagnosis not present

## 2019-10-30 LAB — CUP PACEART REMOTE DEVICE CHECK
Battery Remaining Longevity: 34 mo
Battery Remaining Percentage: 40 %
Battery Voltage: 2.84 V
Brady Statistic AP VP Percent: 1 %
Brady Statistic AP VS Percent: 21 %
Brady Statistic AS VP Percent: 1 %
Brady Statistic AS VS Percent: 77 %
Brady Statistic RA Percent Paced: 19 %
Brady Statistic RV Percent Paced: 1 %
Date Time Interrogation Session: 20210429030517
Implantable Lead Implant Date: 19930223
Implantable Lead Implant Date: 19930223
Implantable Lead Location: 753859
Implantable Lead Location: 753860
Implantable Pulse Generator Implant Date: 20130725
Lead Channel Impedance Value: 530 Ohm
Lead Channel Impedance Value: 530 Ohm
Lead Channel Pacing Threshold Amplitude: 1 V
Lead Channel Pacing Threshold Amplitude: 2.75 V
Lead Channel Pacing Threshold Pulse Width: 0.5 ms
Lead Channel Pacing Threshold Pulse Width: 1 ms
Lead Channel Sensing Intrinsic Amplitude: 12 mV
Lead Channel Sensing Intrinsic Amplitude: 4.5 mV
Lead Channel Setting Pacing Amplitude: 2.5 V
Lead Channel Setting Pacing Amplitude: 5 V
Lead Channel Setting Pacing Pulse Width: 0.5 ms
Lead Channel Setting Sensing Sensitivity: 2 mV
Pulse Gen Model: 2210
Pulse Gen Serial Number: 7364115

## 2019-10-31 NOTE — Progress Notes (Signed)
PPM Remote  

## 2019-11-10 ENCOUNTER — Other Ambulatory Visit: Payer: Self-pay

## 2019-11-10 ENCOUNTER — Telehealth: Payer: Self-pay

## 2019-11-10 ENCOUNTER — Telehealth (INDEPENDENT_AMBULATORY_CARE_PROVIDER_SITE_OTHER): Payer: Medicare Other | Admitting: Internal Medicine

## 2019-11-10 DIAGNOSIS — Z952 Presence of prosthetic heart valve: Secondary | ICD-10-CM

## 2019-11-10 DIAGNOSIS — I441 Atrioventricular block, second degree: Secondary | ICD-10-CM | POA: Diagnosis not present

## 2019-11-10 DIAGNOSIS — I10 Essential (primary) hypertension: Secondary | ICD-10-CM

## 2019-11-10 DIAGNOSIS — I428 Other cardiomyopathies: Secondary | ICD-10-CM

## 2019-11-10 NOTE — Telephone Encounter (Signed)
Order placed for ECHO  1 yr recall entered

## 2019-11-10 NOTE — Progress Notes (Signed)
Electrophysiology TeleHealth Note  Due to national recommendations of social distancing due to COVID 19, an audio telehealth visit is felt to be most appropriate for this patient at this time.  Verbal consent was obtained by me for the telehealth visit today.  The patient does not have capability for a virtual visit.  A phone visit is therefore required today.   Date:  11/10/2019   ID:  Noah Thompson, DOB Mar 14, 1941, MRN [id number]  Location: patient's home  Provider location:  [place]  Evaluation Performed: Follow-up visit  PCP:  [name], MD   Electrophysiologist:  Dr Johney Frame  Chief Complaint:  Pacemaker follow up  History of Present Illness:    Noah Thompson is a 79 y.o. male who presents via telehealth conferencing today.  Since last being seen in our clinic, the patient reports doing very well.  Today, he denies symptoms of palpitations, chest pain, shortness of breath,  lower extremity edema, dizziness, presyncope, or syncope.  The patient is otherwise without complaint today.     Past Medical History:  Diagnosis Date  . CAP (community acquired pneumonia) 07/25/2017  . Cataract    left eye  . CHF (congestive heart failure) (HCC)   . Complete heart block (HCC)    a. 1993 s/p PPM 2/2 perioperative heart block;  b. 01/2012 s/p SJM Accent DC PPM upgrade.  . Depression   . GERD (gastroesophageal reflux disease)   . History of nephritis    a. as child.  Marland Kitchen History of pneumonia   . Hyperlipidemia   . Hypertension   . NICM (nonischemic cardiomyopathy) (HCC)    a. 06/2012 Echo: EF 45-50%;  b. 08/2013 Echo: EF 20-25%;  c. 10/2013 Adenosine MV: Fixed septal defect likely representing LBBB-related artifact, no reversible ischemia, EF 43%, low risk.  . Paroxysmal atrial tachycardia (HCC)    a. 07/2013 noted on device interrogation.  . Presence of permanent cardiac pacemaker 2013  . RA (rheumatoid arthritis) (HCC)   . S/P aortic valve replacement    a. 1993 s/p  SJM mechanical AVR for aortic stenosis - chronic coumadin.  Marland Kitchen Shortness of breath dyspnea     Past Surgical History:  Procedure Laterality Date  . ANAL FISSURE REPAIR  1970s  . AORTIC VALVE REPLACEMENT  1993   St. Jude Mechanical Valve  . INSERT / REPLACE / REMOVE PACEMAKER    . PACEMAKER INSERTION  1993   for AV block, not pacemaker dependant  . PERMANENT PACEMAKER GENERATOR CHANGE N/A 01/25/2012   Procedure: PERMANENT PACEMAKER GENERATOR CHANGE;  Surgeon: [name], MD;  Location: [facility] CATH LAB;  Service: Cardiovascular;  Laterality: N/A;    Current Outpatient Medications  Medication Sig Dispense Refill  . alum & mag hydroxide-simeth (MAALOX/MYLANTA) 200-200-20 MG/5ML suspension Take 15 mLs by mouth every 6 (six) hours as needed for indigestion or heartburn.    . losartan (COZAAR) 50 MG tablet Take 1 tablet by mouth once daily 90 tablet 3  . metoprolol succinate (TOPROL-XL) 50 MG 24 hr tablet Take 1 tablet (50 mg total) by mouth daily. Please make overdue appt with Dr. Johney Frame before anymore refills. 2nd attempt 15 tablet 0  . omeprazole (PRILOSEC) 40 MG capsule Take 1 capsule (40 mg total) by mouth daily. Take daily for 3 days then as needed 30 capsule 1  . warfarin (COUMADIN) 5 MG tablet TAKE AS DIRECTED BY  COUMADIN  CLINIC 80 tablet 1   No current facility-administered medications for this visit.  Allergies:   Coreg [carvedilol], Lisinopril, and Tessalon [benzonatate]   Social History:  The patient  reports that he has never smoked. He has never used smokeless tobacco. He reports that he does not drink alcohol or use drugs.   ROS:  Please see the history of present illness.   All other systems are personally reviewed and negative.    Exam:    Vital Signs:  There were no vitals taken for this visit.  Well sounding, alert and conversant   Labs/Other Tests and Data Reviewed:    Recent Labs: No results found for requested labs within last 8760 hours.   Wt Readings from  Last 3 Encounters:  11/12/18 171 lb (77.6 kg)  04/03/18 174 lb 12.8 oz (79.3 kg)  08/03/17 165 lb (74.8 kg)     Last device remote is reviewed from [place] PDF which reveals normal device function  ASSESSMENT & PLAN:    1.  Symptomatic Mobitz II heart block Leads are old (from 75) He has known high RA threshold - output programmed at 5V Recent remote reviewed and normal - see [employer] report  2.  Chronic systolic heart failure Stable No change required today Continue medical therapy  3.  HTN Stable No change required today  4.  Mechanical aortic valve Continue warfarin Mild to moderate AS by echo in 2018 Will update echo     Risks, benefits and potential toxicities for medications prescribed and/or refilled reviewed with patient today.   Follow-up:  Remotes, with me in a year   Patient Risk:  after full review of this patients clinical status, I feel that they are at moderate risk at this time.  Today, I have spent 15 minutes with the patient with telehealth technology discussing arrhythmia management .    Army Fossa, MD  11/10/2019 12:39 PM     [place] [address] [phone] (office) [phone] (fax)

## 2019-11-10 NOTE — Telephone Encounter (Signed)
-----   Message from [name], MD sent at 11/10/2019 12:46 PM EDT ----- Echo to evaluate EF  Followup with me in a year

## 2019-11-14 ENCOUNTER — Other Ambulatory Visit: Payer: Self-pay | Admitting: Internal Medicine

## 2019-11-18 ENCOUNTER — Ambulatory Visit: Payer: Medicare Other

## 2019-11-20 ENCOUNTER — Telehealth: Payer: Self-pay

## 2019-11-20 NOTE — Telephone Encounter (Signed)
Pt cancelled coumadin clinic apt on 5/18. That apt needs to be RS. LVM

## 2019-11-20 NOTE — Telephone Encounter (Addendum)
A user error has taken place: encounter opened in error, closed for administrative reasons.

## 2019-11-26 NOTE — Telephone Encounter (Signed)
LVM for pt to call to RS coumadin clinic apt  

## 2019-12-02 ENCOUNTER — Other Ambulatory Visit: Payer: Self-pay

## 2019-12-02 ENCOUNTER — Ambulatory Visit (HOSPITAL_COMMUNITY): Payer: Medicare Other | Attending: Cardiology

## 2019-12-02 DIAGNOSIS — I428 Other cardiomyopathies: Secondary | ICD-10-CM | POA: Diagnosis not present

## 2019-12-09 ENCOUNTER — Telehealth: Payer: Self-pay | Admitting: Internal Medicine

## 2019-12-09 ENCOUNTER — Other Ambulatory Visit: Payer: Self-pay

## 2019-12-09 ENCOUNTER — Ambulatory Visit (INDEPENDENT_AMBULATORY_CARE_PROVIDER_SITE_OTHER): Payer: Medicare Other

## 2019-12-09 DIAGNOSIS — Z7901 Long term (current) use of anticoagulants: Secondary | ICD-10-CM

## 2019-12-09 LAB — POCT INR: INR: 2.8 (ref 2.0–3.0)

## 2019-12-09 NOTE — Patient Instructions (Addendum)
Pre visit review using our clinic review tool, if applicable. No additional management support is needed unless otherwise documented below in the visit note.  Continue taking 2.5 mg M, W, F, and take 5mg  T, TH, Sat, Sun.   Re-check in to 6 weeks.

## 2019-12-09 NOTE — Telephone Encounter (Signed)
Patient's wife calling stating the [place] sent a request for information, but have not heard back. She states the patient had already signed a release for the information a few months ago.

## 2019-12-10 NOTE — Telephone Encounter (Signed)
I spoke with patient's wife. She reports they received letter from [place] dated June 1,2021 indicating [facility] not [facility] requested in May. I told patient's wife I would follow up with our medical records department and medical records would contact her if any questions. Request from [place] is scanned in under media tab

## 2020-01-20 ENCOUNTER — Ambulatory Visit (INDEPENDENT_AMBULATORY_CARE_PROVIDER_SITE_OTHER): Payer: Medicare Other

## 2020-01-20 ENCOUNTER — Other Ambulatory Visit: Payer: Self-pay

## 2020-01-20 DIAGNOSIS — Z7901 Long term (current) use of anticoagulants: Secondary | ICD-10-CM | POA: Diagnosis not present

## 2020-01-20 LAB — POCT INR: INR: 1.9 — AB (ref 2.0–3.0)

## 2020-01-20 NOTE — Patient Instructions (Addendum)
Pre visit review using our clinic review tool, if applicable. No additional management support is needed unless otherwise documented below in the visit note.  Increase dose today to 10mg  then continue taking 2.5 mg M, W, F, and take 5mg  T, TH, Sat, Sun.   Re-check in to 4 weeks.

## 2020-01-21 NOTE — Progress Notes (Signed)
Agree. Thanks

## 2020-01-28 ENCOUNTER — Telehealth: Payer: Self-pay | Admitting: Family Medicine

## 2020-01-29 ENCOUNTER — Ambulatory Visit (INDEPENDENT_AMBULATORY_CARE_PROVIDER_SITE_OTHER): Payer: Medicare Other | Admitting: *Deleted

## 2020-01-29 DIAGNOSIS — I428 Other cardiomyopathies: Secondary | ICD-10-CM

## 2020-01-29 DIAGNOSIS — I441 Atrioventricular block, second degree: Secondary | ICD-10-CM

## 2020-01-29 LAB — CUP PACEART REMOTE DEVICE CHECK
Battery Remaining Longevity: 28 mo
Battery Remaining Percentage: 35 %
Battery Voltage: 2.83 V
Brady Statistic AP VP Percent: 1 %
Brady Statistic AP VS Percent: 22 %
Brady Statistic AS VP Percent: 1 %
Brady Statistic AS VS Percent: 77 %
Brady Statistic RA Percent Paced: 20 %
Brady Statistic RV Percent Paced: 1 %
Date Time Interrogation Session: 20210729034038
Implantable Lead Implant Date: 19930223
Implantable Lead Implant Date: 19930223
Implantable Lead Location: 753859
Implantable Lead Location: 753860
Implantable Pulse Generator Implant Date: 20130725
Lead Channel Impedance Value: 460 Ohm
Lead Channel Impedance Value: 460 Ohm
Lead Channel Pacing Threshold Amplitude: 1 V
Lead Channel Pacing Threshold Amplitude: 2.75 V
Lead Channel Pacing Threshold Pulse Width: 0.5 ms
Lead Channel Pacing Threshold Pulse Width: 1 ms
Lead Channel Sensing Intrinsic Amplitude: 3.8 mV
Lead Channel Sensing Intrinsic Amplitude: 9.9 mV
Lead Channel Setting Pacing Amplitude: 2.5 V
Lead Channel Setting Pacing Amplitude: 5 V
Lead Channel Setting Pacing Pulse Width: 0.5 ms
Lead Channel Setting Sensing Sensitivity: 2 mV
Pulse Gen Model: 2210
Pulse Gen Serial Number: 7364115

## 2020-01-29 NOTE — Telephone Encounter (Signed)
Pt hasn't been seen in over a year and no future appt., will route to PCP for review

## 2020-01-30 NOTE — Telephone Encounter (Signed)
Please schedule physical.

## 2020-02-01 HISTORY — PX: CATARACT EXTRACTION, BILATERAL: SHX1313

## 2020-02-02 NOTE — Progress Notes (Signed)
Remote pacemaker transmission.   

## 2020-02-02 NOTE — Telephone Encounter (Signed)
Plz schedule wellness, cpe and lab visits.  

## 2020-02-17 ENCOUNTER — Ambulatory Visit: Payer: Medicare Other

## 2020-02-19 NOTE — Telephone Encounter (Signed)
Pt also missed coumadin clinic apt on 8/17 and needs RS. Please send call to this nurse to RS coumadin apt and also send call to [place] to scheduled other apts.  LVM

## 2020-02-20 ENCOUNTER — Telehealth: Payer: Self-pay

## 2020-02-20 MED ORDER — LOPERAMIDE HCL 2 MG PO CAPS: 2.0000 mg | ORAL_CAPSULE | Freq: Four times a day (QID) | ORAL | Status: AC | PRN

## 2020-02-20 NOTE — Telephone Encounter (Addendum)
Should be okay to use imodium 2mg  QID prn.  Please update as needed.  Thanks.

## 2020-02-20 NOTE — Addendum Note (Signed)
Addended by: Joaquim Nam on: 02/20/2020 01:56 PM   Modules accepted: Orders

## 2020-02-20 NOTE — Telephone Encounter (Signed)
Ms Anger calls to see if missed call about taking imodium; advised that Dr Para March is seeing pts at this time and after he reviews the note they will get cb. Ms Merrick said that was OK.

## 2020-02-20 NOTE — Telephone Encounter (Signed)
Spoke with pt's wife, [name] (on dpr), relaying Dr. Lianne Bushy message.  Verbalizes understanding.

## 2020-02-20 NOTE — Telephone Encounter (Addendum)
[name] calling stating pt has diarrhea. Also, c/o nausea when having BM. Started yesterday.  Denies any fever or vomiting.  Pt thinks sxs may be due to taking omeprazole then Mylanta for stomach discomfort. Declines virtual visit but asking is it ok for pt to take Imodium.  Plz advise.

## 2020-02-24 ENCOUNTER — Telehealth: Payer: Self-pay | Admitting: *Deleted

## 2020-02-24 NOTE — Telephone Encounter (Signed)
Patient's wife left a voicemail stating that [name] is doing much better now. Mrs. Cuff stated that he took the medication recommended by Dr. Para March and it helped.

## 2020-02-24 NOTE — Telephone Encounter (Signed)
Called patient to scheduled CPE and labs but could not leave voicemail. Patient still needs to be scheduled.

## 2020-02-25 ENCOUNTER — Telehealth: Payer: Self-pay

## 2020-02-25 NOTE — Telephone Encounter (Signed)
Unable to LM on home number. LMV on cell. Contacted pt's daughter and she said they have had problems with home number and she will let them know pt needs PCP apt, wellness apt, coumadin apt and lab apt.

## 2020-02-25 NOTE — Telephone Encounter (Signed)
Per Dr. Reece Agar    4:43 PM Note Plz schedule wellness, cpe and lab visits.  PT also needs a coumadin clinic apt.       Unable to LM on home number. LMV on cell. Contacted pt's daughter and she said they have had problems with home number and she will let them know pt needs PCP apt, wellness apt, coumadin apt and lab apt.

## 2020-02-26 NOTE — Telephone Encounter (Signed)
Glad to hear imodium helped.

## 2020-03-04 ENCOUNTER — Other Ambulatory Visit: Payer: Self-pay

## 2020-03-04 ENCOUNTER — Ambulatory Visit (INDEPENDENT_AMBULATORY_CARE_PROVIDER_SITE_OTHER): Payer: Medicare Other

## 2020-03-04 DIAGNOSIS — Z7901 Long term (current) use of anticoagulants: Secondary | ICD-10-CM | POA: Diagnosis not present

## 2020-03-04 LAB — POCT INR: INR: 5.5 — AB (ref 2.0–3.0)

## 2020-03-04 NOTE — Patient Instructions (Addendum)
Pre visit review using our clinic review tool, if applicable. No additional management support is needed unless otherwise documented below in the visit note.  Hold dose today and hold dose tomorrow and then continue taking 2.5 mg M, W, F, and take 5mg  T, TH, Sat, Sun.   Re-check in to 1 weeks.

## 2020-03-04 NOTE — Telephone Encounter (Signed)
Patient came in office and stated he will get back with [place] to schedule his appointment.

## 2020-03-08 NOTE — Progress Notes (Signed)
Agree. Thanks

## 2020-03-11 ENCOUNTER — Other Ambulatory Visit: Payer: Self-pay

## 2020-03-11 ENCOUNTER — Ambulatory Visit (INDEPENDENT_AMBULATORY_CARE_PROVIDER_SITE_OTHER): Payer: Medicare Other

## 2020-03-11 DIAGNOSIS — Z7901 Long term (current) use of anticoagulants: Secondary | ICD-10-CM

## 2020-03-11 LAB — POCT INR: INR: 3.3 — AB (ref 2.0–3.0)

## 2020-03-11 NOTE — Patient Instructions (Addendum)
Pre visit review using our clinic review tool, if applicable. No additional management support is needed unless otherwise documented below in the visit note.  Continue taking 2.5 mg M, W, F, and take 5mg  T, TH, Sat, Sun.   Re-check in to 4 weeks.

## 2020-03-14 NOTE — Progress Notes (Signed)
Agree. Thanks

## 2020-04-06 ENCOUNTER — Ambulatory Visit: Payer: Medicare Other

## 2020-04-06 ENCOUNTER — Ambulatory Visit (INDEPENDENT_AMBULATORY_CARE_PROVIDER_SITE_OTHER): Payer: Medicare Other

## 2020-04-06 ENCOUNTER — Other Ambulatory Visit: Payer: Self-pay

## 2020-04-06 DIAGNOSIS — Z23 Encounter for immunization: Secondary | ICD-10-CM | POA: Diagnosis not present

## 2020-04-06 DIAGNOSIS — Z7901 Long term (current) use of anticoagulants: Secondary | ICD-10-CM | POA: Diagnosis not present

## 2020-04-06 LAB — POCT INR: INR: 4.6 — AB (ref 2.0–3.0)

## 2020-04-06 NOTE — Progress Notes (Signed)
Pt requested high dose flu vaccine. Vaccine administered. Pt tolerated well

## 2020-04-06 NOTE — Patient Instructions (Addendum)
Pre visit review using our clinic review tool, if applicable. No additional management support is needed unless otherwise documented below in the visit note.  Hold dose today then continue taking 2.5 mg M, W, F, and take 5mg  T, TH, Sat, Sun.   Re-check in 4 weeks.

## 2020-04-07 DIAGNOSIS — Z0120 Encounter for dental examination and cleaning without abnormal findings: Secondary | ICD-10-CM | POA: Diagnosis not present

## 2020-04-23 ENCOUNTER — Other Ambulatory Visit: Payer: Self-pay | Admitting: Family Medicine

## 2020-04-29 ENCOUNTER — Ambulatory Visit (INDEPENDENT_AMBULATORY_CARE_PROVIDER_SITE_OTHER): Payer: Medicare Other

## 2020-04-29 DIAGNOSIS — I441 Atrioventricular block, second degree: Secondary | ICD-10-CM | POA: Diagnosis not present

## 2020-04-29 LAB — CUP PACEART REMOTE DEVICE CHECK
Battery Remaining Longevity: 23 mo
Battery Remaining Percentage: 29 %
Battery Voltage: 2.81 V
Brady Statistic AP VP Percent: 1 %
Brady Statistic AP VS Percent: 22 %
Brady Statistic AS VP Percent: 1.1 %
Brady Statistic AS VS Percent: 75 %
Brady Statistic RA Percent Paced: 20 %
Brady Statistic RV Percent Paced: 1.7 %
Date Time Interrogation Session: 20211028021043
Implantable Lead Implant Date: 19930223
Implantable Lead Implant Date: 19930223
Implantable Lead Location: 753859
Implantable Lead Location: 753860
Implantable Pulse Generator Implant Date: 20130725
Lead Channel Impedance Value: 450 Ohm
Lead Channel Impedance Value: 480 Ohm
Lead Channel Pacing Threshold Amplitude: 1 V
Lead Channel Pacing Threshold Amplitude: 2.75 V
Lead Channel Pacing Threshold Pulse Width: 0.5 ms
Lead Channel Pacing Threshold Pulse Width: 1 ms
Lead Channel Sensing Intrinsic Amplitude: 12 mV
Lead Channel Sensing Intrinsic Amplitude: 2.7 mV
Lead Channel Setting Pacing Amplitude: 2.5 V
Lead Channel Setting Pacing Amplitude: 5 V
Lead Channel Setting Pacing Pulse Width: 0.5 ms
Lead Channel Setting Sensing Sensitivity: 2 mV
Pulse Gen Model: 2210
Pulse Gen Serial Number: 7364115

## 2020-05-04 ENCOUNTER — Ambulatory Visit (INDEPENDENT_AMBULATORY_CARE_PROVIDER_SITE_OTHER): Payer: Medicare Other

## 2020-05-04 ENCOUNTER — Other Ambulatory Visit: Payer: Self-pay

## 2020-05-04 DIAGNOSIS — Z7901 Long term (current) use of anticoagulants: Secondary | ICD-10-CM

## 2020-05-04 LAB — POCT INR: INR: 2.9 (ref 2.0–3.0)

## 2020-05-04 NOTE — Progress Notes (Signed)
Remote pacemaker transmission.   

## 2020-05-04 NOTE — Patient Instructions (Signed)
Pt is to continue taking 2.5 mg M, W, F, and take 5mg  T, TH, Sat, Sun.   Re-check in 4 weeks.  No other changes to diet, health or medications.

## 2020-05-10 ENCOUNTER — Other Ambulatory Visit: Payer: Self-pay | Admitting: Family Medicine

## 2020-05-10 DIAGNOSIS — Z7901 Long term (current) use of anticoagulants: Secondary | ICD-10-CM

## 2020-05-19 DIAGNOSIS — Z0120 Encounter for dental examination and cleaning without abnormal findings: Secondary | ICD-10-CM | POA: Diagnosis not present

## 2020-05-20 ENCOUNTER — Encounter: Payer: Self-pay | Admitting: Family Medicine

## 2020-05-20 LAB — COLOGUARD

## 2020-05-23 ENCOUNTER — Other Ambulatory Visit: Payer: Self-pay | Admitting: Family Medicine

## 2020-05-23 DIAGNOSIS — Z1159 Encounter for screening for other viral diseases: Secondary | ICD-10-CM

## 2020-05-23 DIAGNOSIS — N183 Chronic kidney disease, stage 3 unspecified: Secondary | ICD-10-CM

## 2020-05-23 DIAGNOSIS — M109 Gout, unspecified: Secondary | ICD-10-CM

## 2020-05-23 DIAGNOSIS — E782 Mixed hyperlipidemia: Secondary | ICD-10-CM

## 2020-05-23 DIAGNOSIS — R972 Elevated prostate specific antigen [PSA]: Secondary | ICD-10-CM

## 2020-05-24 ENCOUNTER — Other Ambulatory Visit: Payer: Self-pay

## 2020-05-24 ENCOUNTER — Other Ambulatory Visit (INDEPENDENT_AMBULATORY_CARE_PROVIDER_SITE_OTHER): Payer: Medicare Other

## 2020-05-24 DIAGNOSIS — Z1159 Encounter for screening for other viral diseases: Secondary | ICD-10-CM

## 2020-05-24 DIAGNOSIS — N183 Chronic kidney disease, stage 3 unspecified: Secondary | ICD-10-CM

## 2020-05-24 DIAGNOSIS — R972 Elevated prostate specific antigen [PSA]: Secondary | ICD-10-CM | POA: Diagnosis not present

## 2020-05-24 DIAGNOSIS — M109 Gout, unspecified: Secondary | ICD-10-CM | POA: Diagnosis not present

## 2020-05-24 DIAGNOSIS — E782 Mixed hyperlipidemia: Secondary | ICD-10-CM

## 2020-05-24 LAB — COMPREHENSIVE METABOLIC PANEL
ALT: 15 U/L (ref 0–53)
AST: 10 U/L (ref 0–37)
Albumin: 4.4 g/dL (ref 3.5–5.2)
Alkaline Phosphatase: 77 U/L (ref 39–117)
BUN: 20 mg/dL (ref 6–23)
CO2: 30 mEq/L (ref 19–32)
Calcium: 9.6 mg/dL (ref 8.4–10.5)
Chloride: 103 mEq/L (ref 96–112)
Creatinine, Ser: 1.3 mg/dL (ref 0.40–1.50)
GFR: 52.28 mL/min — ABNORMAL LOW (ref >60.00)
Glucose, Bld: 90 mg/dL (ref 70–99)
Potassium: 4.7 mEq/L (ref 3.5–5.1)
Sodium: 140 mEq/L (ref 135–145)
Total Bilirubin: 0.6 mg/dL (ref 0.2–1.2)
Total Protein: 6.5 g/dL (ref 6.0–8.3)

## 2020-05-24 LAB — CBC WITH DIFFERENTIAL/PLATELET
Basophils Absolute: 0.1 10*3/uL (ref 0.0–0.1)
Basophils Relative: 0.7 % (ref 0.0–3.0)
Eosinophils Absolute: 0.9 10*3/uL — ABNORMAL HIGH (ref 0.0–0.7)
Eosinophils Relative: 11.4 % — ABNORMAL HIGH (ref 0.0–5.0)
HCT: 41.1 % (ref 39.0–52.0)
Hemoglobin: 13.9 g/dL (ref 13.0–17.0)
Lymphocytes Relative: 26.8 % (ref 12.0–46.0)
Lymphs Abs: 2.1 10*3/uL (ref 0.7–4.0)
MCHC: 33.8 g/dL (ref 30.0–36.0)
MCV: 85.8 fl (ref 78.0–100.0)
Monocytes Absolute: 0.8 10*3/uL (ref 0.1–1.0)
Monocytes Relative: 9.5 % (ref 3.0–12.0)
Neutro Abs: 4.1 10*3/uL (ref 1.4–7.7)
Neutrophils Relative %: 51.6 % (ref 43.0–77.0)
Platelets: 247 10*3/uL (ref 150.0–400.0)
RBC: 4.79 Mil/uL (ref 4.22–5.81)
RDW: 14.7 % (ref 11.5–15.5)
WBC: 7.9 10*3/uL (ref 4.0–10.5)

## 2020-05-24 LAB — LIPID PANEL
Cholesterol: 222 mg/dL — ABNORMAL HIGH (ref 0–200)
HDL: 48.7 mg/dL (ref >39.00)
LDL Cholesterol: 138 mg/dL — ABNORMAL HIGH (ref 0–99)
NonHDL: 173.23
Total CHOL/HDL Ratio: 5
Triglycerides: 174 mg/dL — ABNORMAL HIGH (ref 0.0–149.0)
VLDL: 34.8 mg/dL (ref 0.0–40.0)

## 2020-05-24 LAB — VITAMIN D 25 HYDROXY (VIT D DEFICIENCY, FRACTURES): VITD: 31.36 ng/mL (ref 30.00–100.00)

## 2020-05-24 LAB — PSA: PSA: 1.77 ng/mL (ref 0.10–4.00)

## 2020-05-24 LAB — URIC ACID: Uric Acid, Serum: 6.9 mg/dL (ref 4.0–7.8)

## 2020-05-25 LAB — HEPATITIS C ANTIBODY
Hepatitis C Ab: NONREACTIVE
SIGNAL TO CUT-OFF: 0.02 (ref <1.00)

## 2020-06-01 ENCOUNTER — Ambulatory Visit (INDEPENDENT_AMBULATORY_CARE_PROVIDER_SITE_OTHER): Payer: Medicare Other | Admitting: Family Medicine

## 2020-06-01 ENCOUNTER — Encounter: Payer: Self-pay | Admitting: Family Medicine

## 2020-06-01 ENCOUNTER — Other Ambulatory Visit: Payer: Self-pay

## 2020-06-01 ENCOUNTER — Ambulatory Visit (INDEPENDENT_AMBULATORY_CARE_PROVIDER_SITE_OTHER): Payer: Medicare Other

## 2020-06-01 VITALS — BP 144/96 | HR 54 | Temp 97.8°F | Ht 69.0 in | Wt 181.1 lb

## 2020-06-01 DIAGNOSIS — I428 Other cardiomyopathies: Secondary | ICD-10-CM

## 2020-06-01 DIAGNOSIS — R21 Rash and other nonspecific skin eruption: Secondary | ICD-10-CM

## 2020-06-01 DIAGNOSIS — Z950 Presence of cardiac pacemaker: Secondary | ICD-10-CM

## 2020-06-01 DIAGNOSIS — I5032 Chronic diastolic (congestive) heart failure: Secondary | ICD-10-CM

## 2020-06-01 DIAGNOSIS — N1831 Chronic kidney disease, stage 3a: Secondary | ICD-10-CM

## 2020-06-01 DIAGNOSIS — Z952 Presence of prosthetic heart valve: Secondary | ICD-10-CM

## 2020-06-01 DIAGNOSIS — Z7901 Long term (current) use of anticoagulants: Secondary | ICD-10-CM | POA: Diagnosis not present

## 2020-06-01 DIAGNOSIS — I10 Essential (primary) hypertension: Secondary | ICD-10-CM

## 2020-06-01 DIAGNOSIS — K219 Gastro-esophageal reflux disease without esophagitis: Secondary | ICD-10-CM

## 2020-06-01 DIAGNOSIS — Z8042 Family history of malignant neoplasm of prostate: Secondary | ICD-10-CM

## 2020-06-01 DIAGNOSIS — E782 Mixed hyperlipidemia: Secondary | ICD-10-CM

## 2020-06-01 DIAGNOSIS — Z0000 Encounter for general adult medical examination without abnormal findings: Secondary | ICD-10-CM | POA: Diagnosis not present

## 2020-06-01 DIAGNOSIS — Z7189 Other specified counseling: Secondary | ICD-10-CM

## 2020-06-01 LAB — POCT INR: INR: 3.6 — AB (ref 2.0–3.0)

## 2020-06-01 MED ORDER — OMEPRAZOLE 40 MG PO CPDR
40.0000 mg | DELAYED_RELEASE_CAPSULE | Freq: Every day | ORAL | 3 refills | Status: DC
Start: 2020-06-01 — End: 2024-02-26

## 2020-06-01 MED ORDER — LORATADINE 10 MG PO TABS: 10.0000 mg | ORAL_TABLET | Freq: Every day | ORAL | Status: DC | PRN

## 2020-06-01 MED ORDER — LOSARTAN POTASSIUM 50 MG PO TABS
50.0000 mg | ORAL_TABLET | Freq: Every day | ORAL | 3 refills | Status: DC
Start: 2020-06-01 — End: 2020-12-22

## 2020-06-01 NOTE — Assessment & Plan Note (Signed)
Seems euvolemic today. Only on losartan and toprol XL

## 2020-06-01 NOTE — Assessment & Plan Note (Signed)
Managed with sparing PRN PPI

## 2020-06-01 NOTE — Assessment & Plan Note (Signed)
Continue coumadin clinic care.

## 2020-06-01 NOTE — Patient Instructions (Addendum)
If interested, check with pharmacy about new 2 shot shingles series (shingrix).  I do recommend COVID vaccines.  Keep working on advanced directive, bring [place] a copy when complete.  Work on good water intake, fiber and low sodium diet to keep blood pressure and kidney function under control.  Good to see you today, return as needed or in 1 year for next physical/wellness visit.  Health Maintenance After Age 79 After age 37, you are at a higher risk for certain long-term diseases and infections as well as injuries from falls. Falls are a major cause of broken bones and head injuries in people who are older than age 87. Getting regular preventive care can help to keep you healthy and well. Preventive care includes getting regular testing and making lifestyle changes as recommended by your health care provider. Talk with your health care provider about:  Which screenings and tests you should have. A screening is a test that checks for a disease when you have no symptoms.  A diet and exercise plan that is right for you. What should I know about screenings and tests to prevent falls? Screening and testing are the best ways to find a health problem early. Early diagnosis and treatment give you the best chance of managing medical conditions that are common after age 7. Certain conditions and lifestyle choices may make you more likely to have a fall. Your health care provider may recommend:  Regular vision checks. Poor vision and conditions such as cataracts can make you more likely to have a fall. If you wear glasses, make sure to get your prescription updated if your vision changes.  Medicine review. Work with your health care provider to regularly review all of the medicines you are taking, including over-the-counter medicines. Ask your health care provider about any side effects that may make you more likely to have a fall. Tell your health care provider if any medicines that you take make you feel  dizzy or sleepy.  Osteoporosis screening. Osteoporosis is a condition that causes the bones to get weaker. This can make the bones weak and cause them to break more easily.  Blood pressure screening. Blood pressure changes and medicines to control blood pressure can make you feel dizzy.  Strength and balance checks. Your health care provider may recommend certain tests to check your strength and balance while standing, walking, or changing positions.  Foot health exam. Foot pain and numbness, as well as not wearing proper footwear, can make you more likely to have a fall.  Depression screening. You may be more likely to have a fall if you have a fear of falling, feel emotionally low, or feel unable to do activities that you used to do.  Alcohol use screening. Using too much alcohol can affect your balance and may make you more likely to have a fall. What actions can I take to lower my risk of falls? General instructions  Talk with your health care provider about your risks for falling. Tell your health care provider if: ? You fall. Be sure to tell your health care provider about all falls, even ones that seem minor. ? You feel dizzy, sleepy, or off-balance.  Take over-the-counter and prescription medicines only as told by your health care provider. These include any supplements.  Eat a healthy diet and maintain a healthy weight. A healthy diet includes low-fat dairy products, low-fat (lean) meats, and fiber from whole grains, beans, and lots of fruits and vegetables. Home safety  Remove  any tripping hazards, such as rugs, cords, and clutter.  Install safety equipment such as grab bars in bathrooms and safety rails on stairs.  Keep rooms and walkways well-lit. Activity   Follow a regular exercise program to stay fit. This will help you maintain your balance. Ask your health care provider what types of exercise are appropriate for you.  If you need a cane or walker, use it as  recommended by your health care provider.  Wear supportive shoes that have nonskid soles. Lifestyle  Do not drink alcohol if your health care provider tells you not to drink.  If you drink alcohol, limit how much you have: ? 0-1 drink a day for women. ? 0-2 drinks a day for men.  Be aware of how much alcohol is in your drink. In the [place] one drink equals one typical bottle of beer (12 oz), one-half glass of wine (5 oz), or one shot of hard liquor (1 oz).  Do not use any products that contain nicotine or tobacco, such as cigarettes and e-cigarettes. If you need help quitting, ask your health care provider. Summary  Having a healthy lifestyle and getting preventive care can help to protect your health and wellness after age 90.  Screening and testing are the best way to find a health problem early and help you avoid having a fall. Early diagnosis and treatment give you the best chance for managing medical conditions that are more common for people who are older than age 45.  Falls are a major cause of broken bones and head injuries in people who are older than age 12. Take precautions to prevent a fall at home.  Work with your health care provider to learn what changes you can make to improve your health and wellness and to prevent falls. This information is not intended to replace advice given to you by your health care provider. Make sure you discuss any questions you have with your health care provider. Document Revised: 10/10/2018 Document Reviewed: 05/02/2017 Elsevier Patient Education  2020 [employer].

## 2020-06-01 NOTE — Assessment & Plan Note (Signed)
Chronic, mildly elevated. Goal BP <135/85 given CKD. Reviewed this. Encouraged increased water intake, limiting sodium/salt in diet.

## 2020-06-01 NOTE — Assessment & Plan Note (Signed)
Chronic, overall stable off statin. Reviewed diet choices to improve LDL levels.  The 10-year ASCVD risk score Denman George DC Montez Hageman., et al., 2013) is: 42.6%   Values used to calculate the score:     Age: 79 years     Sex: Male     Is Non-Hispanic African American: No     Diabetic: No     Tobacco smoker: No     Systolic Blood Pressure: 144 mmHg     Is BP treated: Yes     HDL Cholesterol: 48.7 mg/dL     Total Cholesterol: 222 mg/dL

## 2020-06-01 NOTE — Patient Instructions (Addendum)
Pre visit review using our clinic review tool, if applicable. No additional management support is needed unless otherwise documented below in the visit note.  Hold dose today then change weekly dose to take 2.5 mg daily EXCEPT take 5 mg on Tuesdays, Thursdays, and Saturdays.

## 2020-06-01 NOTE — Assessment & Plan Note (Signed)

## 2020-06-01 NOTE — Progress Notes (Signed)
Patient ID: Noah Thompson, male    DOB: 05/31/1941, 79 y.o.   MRN: [id number]  This visit was conducted in person.  BP (!) 144/96 (BP Location: Left Arm, Patient Position: Sitting, Cuff Size: Normal)   Pulse (!) 54   Temp 97.8 F (36.6 C) (Temporal)   Ht 5\' 9"  (1.753 m)   Wt 181 lb 2 oz (82.2 kg)   SpO2 97%   BMI 26.75 kg/m    CC: AMW  Subjective:   HPI: Noah Thompson is a 79 y.o. male presenting on 06/01/2020 for Medicare Wellness (Pt accompanied by wife, 06/03/2020- temp 97.6.)   Did not see health advisor this year.    Hearing Screening   125Hz  250Hz  500Hz  1000Hz  2000Hz  3000Hz  4000Hz  6000Hz  8000Hz   Right ear:   40 40 25  0    Left ear:   0 40 25  0    Vision Screening Comments: Last eye exam, 02/2020.     Office Visit from 06/01/2020 in [facility] [place]  PHQ-2 Total Score 0      Fall Risk  06/01/2020 11/08/2018 07/27/2017 02/16/2016  Falls in the past year? 0 0 No No    Home BP run 140/90s. Compliant with losartan 50mg  and toprol XL 50mg  daily.  Saw derm Dr - told rash could be due to toprol XL vs losartan.  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  INR checked today 3.6. He was just on amoxicillin course x2 through dentist.   Preventative: Colon cancer screening -cologuard negative 05/2016. Discussed options - would like to stop screening at this time as he would not want colonoscopy even if initial test returns positive.  Prostate cancer screening -previously normal at 03/2020.rpt today given fmhx (father age 32)  Lung cancer screening -never smoker Flu shot yearly Tetanus shot -?declines  COVID vaccine - declined  Pneumonia shot - thinks done at [place] likely pneumovax. Prevnar 02/2016.  Shingrix-discussed,declines. Advanced directive discussion -has not set up. Would want wife to be HCPOA. Packet providedpreviously - they will work on this.  Seat belt use discussed Sunscreen  use discussed.No changing moles on skin.  Non smoker Alcohol - none Dentist yearly Eye exam yearly Bowel - no constipation Bladder - no incontinence  Lives with wife, 5 dogs. Grown children away. 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 Interim medical history since our last visit reviewed. Allergies and medications reviewed and updated. Outpatient Medications Prior to Visit  Medication Sig Dispense Refill  . alum & mag hydroxide-simeth (MAALOX/MYLANTA) 200-200-20 MG/5ML suspension Take 15 mLs by mouth every 6 (six) hours as needed for indigestion or heartburn.    . loperamide (IMODIUM) 2 MG capsule Take 1 capsule (2 mg total) by mouth 4 (four) times daily as needed for diarrhea or loose stools.    . metoprolol succinate (TOPROL-XL) 50 MG 24 hr tablet TAKE 1 TABLET BY MOUTH ONCE DAILY PLEASE  MAKE  OVERFUE  APPT  WITH  DR  07/29/2017  02/18/2016  BEFORE  ANYMORE  REFILLS  -  2ND  ATTEMPT 30 tablet 11  . warfarin (COUMADIN) 5 MG tablet TAKE AS DIRECTED BY  COUMADIN  CLINIC 80 tablet 0  . losartan (COZAAR) 50 MG tablet TAKE 1 TABLET BY MOUTH ONCE  DAILY -  PLEASE  SCHEDULE  PHYSICAL 90 tablet 0  . omeprazole (PRILOSEC) 40 MG capsule Take 1 capsule (40 mg total) by mouth daily. Take daily for 3 days then as needed 30 capsule 1   No facility-administered medications prior to visit.     Per HPI unless specifically indicated in ROS section below Review of Systems  Constitutional: Negative for activity change, appetite change, chills, fatigue, fever and unexpected weight change.  HENT: Negative for hearing loss.   Eyes: Negative for visual disturbance.  Respiratory: Negative for cough, chest tightness, shortness of breath and wheezing.   Cardiovascular: Negative for chest pain, palpitations and leg swelling.    Gastrointestinal: Negative for abdominal distention, abdominal pain, blood in stool, constipation, diarrhea, nausea and vomiting.  Genitourinary: Negative for difficulty urinating and hematuria.  Musculoskeletal: Negative for arthralgias, myalgias and neck pain.  Skin: Negative for rash.  Neurological: Negative for dizziness, seizures, syncope and headaches.  Hematological: Negative for adenopathy. Does not bruise/bleed easily.  Psychiatric/Behavioral: Negative for dysphoric mood. The patient is not nervous/anxious.    Objective:  BP (!) 144/96 (BP Location: Left Arm, Patient Position: Sitting, Cuff Size: Normal)   Pulse (!) 54   Temp 97.8 F (36.6 C) (Temporal)   Ht 5\' 9"  (1.753 m)   Wt 181 lb 2 oz (82.2 kg)   SpO2 97%   BMI 26.75 kg/m   Wt Readings from Last 3 Encounters:  06/01/20 181 lb 2 oz (82.2 kg)  11/12/18 171 lb (77.6 kg)  04/03/18 174 lb 12.8 oz (79.3 kg)      Physical Exam Vitals and nursing note reviewed.  Constitutional:      General: He is not in acute distress.    Appearance: Normal appearance. He is well-developed. He is not ill-appearing.  HENT:     Head: Normocephalic and atraumatic.     Right Ear: Hearing, tympanic membrane, ear canal and external ear normal.     Left Ear: Hearing, tympanic membrane, ear canal and external ear normal.  Eyes:     General: No scleral icterus.    Extraocular Movements: Extraocular movements intact.     Conjunctiva/sclera: Conjunctivae normal.     Pupils: Pupils are equal, round, and reactive to light.  Neck:     Thyroid: No thyroid mass or thyromegaly.     Vascular: No carotid bruit.  Cardiovascular:     Rate and Rhythm: Normal rate and regular rhythm.     Pulses: Normal pulses.          Radial pulses are 2+ on the right side and 2+ on the left side.     Heart sounds: Normal heart sounds. No murmur heard.   Pulmonary:     Effort: Pulmonary effort is normal. No respiratory distress.     Breath sounds: Normal breath  sounds. No wheezing, rhonchi or rales.  Abdominal:     General: Abdomen is flat. Bowel sounds are normal. There is no distension.     Palpations: Abdomen is soft. There is no mass.     Tenderness: There is no abdominal tenderness. There is no guarding or rebound.     Hernia: No hernia is present.  Musculoskeletal:        General: Normal range of motion.     Cervical back: Normal range of motion and neck supple.     Right lower leg: No edema.     Left lower leg: No edema.  Lymphadenopathy:     Cervical: No cervical  adenopathy.  Skin:    General: Skin is warm and dry.     Findings: Rash present.     Comments: Pruritic papular rash to trunk - has seen derm  Neurological:     General: No focal deficit present.     Mental Status: He is alert and oriented to person, place, and time.     Comments:  CN grossly intact, station and gait intact Recall 3/3 Calculation 5/5 DLROW  Psychiatric:        Mood and Affect: Mood normal.        Behavior: Behavior normal.        Thought Content: Thought content normal.        Judgment: Judgment normal.       Results for orders placed or performed in visit on 06/01/20  POCT INR  Result Value Ref Range   INR 3.6 (A) 2.0 - 3.0   Assessment & Plan:  This visit occurred during the SARS-CoV-2 public health emergency.  Safety protocols were in place, including screening questions prior to the visit, additional usage of staff PPE, and extensive cleaning of exam room while observing appropriate contact time as indicated for disinfecting solutions.   Problem List Items Addressed This Visit    Skin rash    Saw derm - ?drug rash possibly to toprol or losartan - pt desires to continue meds as they're working well otherwise.       S/P aortic valve replacement    Continue coumadin.       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 The patient's medical and social history 2. Their use of alcohol, tobacco or illicit drugs 3. Their current medications and supplements 4. The patient's functional ability including ADL's, fall risks, home safety risks and hearing or visual impairment. Cognitive function has been assessed and addressed as indicated.  5. Diet and physical activity 6. Evidence for depression or mood disorders The patients weight, height, BMI have been recorded in the chart. I have made referrals, counseling and provided education to the patient based on review of the above and I have provided the pt with a written personalized care plan for preventive services. Provider list updated.. See scanned questionairre as needed for further documentation. Reviewed preventative protocols and updated unless pt declined.       Long term current use of anticoagulant therapy    Continue coumadin clinic care.       HLD (hyperlipidemia)    Chronic, overall stable off statin. Reviewed diet choices to improve LDL levels.  The 10-year ASCVD risk score Denman George DC Montez Hageman., et al., 2013) is: 42.6%   Values used to calculate the score:     Age: 40 years     Sex: Male     Is Non-Hispanic African American: No     Diabetic: No     Tobacco smoker: No     Systolic Blood Pressure: 144 mmHg     Is BP treated: Yes     HDL Cholesterol: 48.7 mg/dL     Total Cholesterol: 222 mg/dL       Relevant Medications   losartan (COZAAR) 50 MG tablet   Health maintenance examination    Preventative protocols reviewed and updated unless pt declined. Discussed healthy diet and lifestyle.  GERD (gastroesophageal reflux disease)    Managed with sparing PRN PPI      Relevant Medications   omeprazole (PRILOSEC) 40 MG capsule   Family history of prostate cancer    Continuing yearly PSA - father with dx age 40.       Essential hypertension, benign     Chronic, mildly elevated. Goal BP <135/85 given CKD. Reviewed this. Encouraged increased water intake, limiting sodium/salt in diet.       Relevant Medications   losartan (COZAAR) 50 MG tablet   CKD (chronic kidney disease) stage 3, GFR 30-59 ml/min (HCC)    Reviewed with patient, encouraged good water intake.  Consider goal BP <135/85.       Advanced care planning/counseling discussion    Advanced directive discussion -has not set up. Would want wife to be HCPOA. Packet providedpreviously - they will work on this.       (HFpEF) heart failure with preserved ejection fraction (HCC)    Seems euvolemic today. Only on losartan and toprol XL      Relevant Medications   losartan (COZAAR) 50 MG tablet       Meds ordered this encounter  Medications  . losartan (COZAAR) 50 MG tablet    Sig: Take 1 tablet (50 mg total) by mouth daily.    Dispense:  90 tablet    Refill:  3  . omeprazole (PRILOSEC) 40 MG capsule    Sig: Take 1 capsule (40 mg total) by mouth daily. Take daily for 3 days then as needed    Dispense:  30 capsule    Refill:  3  . loratadine (CLARITIN) 10 MG tablet    Sig: Take 1 tablet (10 mg total) by mouth daily as needed for allergies.   No orders of the defined types were placed in this encounter.   Patient instructions: If interested, check with pharmacy about new 2 shot shingles series (shingrix).  I do recommend COVID vaccines.  Keep working on advanced directive, bring [place] a copy when complete.  Work on good water intake, fiber and low sodium diet to keep blood pressure and kidney function under control.  Good to see you today, return as needed or in 1 year for next physical/wellness visit.  Follow up plan: Return in about 1 year (around 06/01/2021) for annual exam, prior fasting for blood work, medicare wellness visit.  [name], MD

## 2020-06-01 NOTE — Assessment & Plan Note (Signed)
Continue coumadin.  

## 2020-06-01 NOTE — Assessment & Plan Note (Signed)
Advanced directive discussion -has not set up. Would want wife to be HCPOA. Packet providedpreviously - they will work on this.

## 2020-06-01 NOTE — Assessment & Plan Note (Signed)
Preventative protocols reviewed and updated unless pt declined. Discussed healthy diet and lifestyle.  

## 2020-06-01 NOTE — Assessment & Plan Note (Signed)
Reviewed with patient, encouraged good water intake.  Consider goal BP <135/85.

## 2020-06-01 NOTE — Assessment & Plan Note (Signed)
Saw derm - ?drug rash possibly to toprol or losartan - pt desires to continue meds as they're working well otherwise.

## 2020-06-01 NOTE — Assessment & Plan Note (Signed)
Continuing yearly PSA - father with dx age 79.

## 2020-06-28 ENCOUNTER — Telehealth: Payer: Self-pay | Admitting: Family Medicine

## 2020-06-28 NOTE — Telephone Encounter (Signed)
This RN stressed the importance of sticking to his appointment D/T him being out of range at his last visit, but he is unable to make it any this week. Patient rescheduled for 07/06/2020 at 1430

## 2020-06-28 NOTE — Telephone Encounter (Signed)
Wanted to cancel his coudmin vaccine

## 2020-06-29 ENCOUNTER — Ambulatory Visit: Payer: Medicare Other

## 2020-07-06 ENCOUNTER — Ambulatory Visit (INDEPENDENT_AMBULATORY_CARE_PROVIDER_SITE_OTHER): Payer: Medicare Other

## 2020-07-06 ENCOUNTER — Other Ambulatory Visit: Payer: Self-pay

## 2020-07-06 DIAGNOSIS — Z7901 Long term (current) use of anticoagulants: Secondary | ICD-10-CM

## 2020-07-06 LAB — POCT INR: INR: 1.9 — AB (ref 2.0–3.0)

## 2020-07-06 NOTE — Patient Instructions (Addendum)
Pre visit review using our clinic review tool, if applicable. No additional management support is needed unless otherwise documented below in the visit note.  Increase dose today to 10mg  and increase dose tomorrow to 5mg   then continue to take 2.5 mg daily EXCEPT take 5 mg on Tuesdays, Thursdays, and Saturdays. Recheck in 3 wks per pt request

## 2020-07-27 ENCOUNTER — Other Ambulatory Visit: Payer: Self-pay | Admitting: Family Medicine

## 2020-07-27 ENCOUNTER — Ambulatory Visit: Payer: Medicare Other

## 2020-07-28 NOTE — Telephone Encounter (Signed)
Pharmacy requests refill on: Losartan 50 mg   LAST REFILL: 06/01/2020 (Q-90, R-3) LAST OV: 06/01/2020 NEXT OV: Not Scheduled  PHARMACY: [id number] [place], [place]  *TOO EARLY FOR REFILL*

## 2020-07-29 ENCOUNTER — Ambulatory Visit (INDEPENDENT_AMBULATORY_CARE_PROVIDER_SITE_OTHER): Payer: Medicare Other | Admitting: Family Medicine

## 2020-07-29 ENCOUNTER — Ambulatory Visit (INDEPENDENT_AMBULATORY_CARE_PROVIDER_SITE_OTHER): Payer: Medicare Other

## 2020-07-29 ENCOUNTER — Other Ambulatory Visit: Payer: Self-pay

## 2020-07-29 DIAGNOSIS — Z7901 Long term (current) use of anticoagulants: Secondary | ICD-10-CM

## 2020-07-29 DIAGNOSIS — I441 Atrioventricular block, second degree: Secondary | ICD-10-CM | POA: Diagnosis not present

## 2020-07-29 LAB — CUP PACEART REMOTE DEVICE CHECK
Battery Remaining Longevity: 20 mo
Battery Remaining Percentage: 25 %
Battery Voltage: 2.8 V
Brady Statistic AP VP Percent: 1 %
Brady Statistic AP VS Percent: 23 %
Brady Statistic AS VP Percent: 1 %
Brady Statistic AS VS Percent: 75 %
Brady Statistic RA Percent Paced: 21 %
Brady Statistic RV Percent Paced: 1.5 %
Date Time Interrogation Session: 20220127034124
Implantable Lead Implant Date: 19930223
Implantable Lead Implant Date: 19930223
Implantable Lead Location: 753859
Implantable Lead Location: 753860
Implantable Pulse Generator Implant Date: 20130725
Lead Channel Impedance Value: 450 Ohm
Lead Channel Impedance Value: 460 Ohm
Lead Channel Pacing Threshold Amplitude: 1 V
Lead Channel Pacing Threshold Amplitude: 2.75 V
Lead Channel Pacing Threshold Pulse Width: 0.5 ms
Lead Channel Pacing Threshold Pulse Width: 1 ms
Lead Channel Sensing Intrinsic Amplitude: 11.3 mV
Lead Channel Sensing Intrinsic Amplitude: 3.5 mV
Lead Channel Setting Pacing Amplitude: 2.5 V
Lead Channel Setting Pacing Amplitude: 5 V
Lead Channel Setting Pacing Pulse Width: 0.5 ms
Lead Channel Setting Sensing Sensitivity: 2 mV
Pulse Gen Model: 2210
Pulse Gen Serial Number: 7364115

## 2020-07-29 LAB — POCT INR: INR: 2.2 (ref 2.0–3.0)

## 2020-07-29 NOTE — Patient Instructions (Signed)
Pre visit review using our clinic review tool, if applicable. No additional management support is needed unless otherwise documented below in the visit note.  Increase dose today to 10mg  and then change weekly dose to take 5mg  daily except take 2.5mg  on Mon, Wed, and Fri.  Recheck in 3 wks.

## 2020-07-30 ENCOUNTER — Other Ambulatory Visit: Payer: Self-pay | Admitting: Family Medicine

## 2020-07-30 DIAGNOSIS — Z7901 Long term (current) use of anticoagulants: Secondary | ICD-10-CM

## 2020-07-30 NOTE — Telephone Encounter (Signed)
Noah Thompson will you address this request since Carollee Herter has left?

## 2020-07-30 NOTE — Telephone Encounter (Signed)
Patient is compliant with coumadin management will refill X 6 months.   

## 2020-08-01 NOTE — Progress Notes (Signed)
Agree. Thanks

## 2020-08-08 NOTE — Progress Notes (Signed)
Remote pacemaker transmission.   

## 2020-08-17 ENCOUNTER — Other Ambulatory Visit: Payer: Self-pay

## 2020-08-17 ENCOUNTER — Ambulatory Visit (INDEPENDENT_AMBULATORY_CARE_PROVIDER_SITE_OTHER): Payer: Medicare Other

## 2020-08-17 DIAGNOSIS — Z7901 Long term (current) use of anticoagulants: Secondary | ICD-10-CM | POA: Diagnosis not present

## 2020-08-17 LAB — POCT INR: INR: 3.7 — AB (ref 2.0–3.0)

## 2020-08-17 NOTE — Patient Instructions (Addendum)
Pre visit review using our clinic review tool, if applicable. No additional management support is needed unless otherwise documented below in the visit note.  Hold dose today and then continue weekly dose to take 5mg  daily except take 2.5mg  on Mon, Wed, and Fri.  Recheck in 4 wks.

## 2020-08-19 ENCOUNTER — Ambulatory Visit: Payer: Medicare Other

## 2020-09-14 ENCOUNTER — Other Ambulatory Visit: Payer: Self-pay

## 2020-09-14 ENCOUNTER — Ambulatory Visit (INDEPENDENT_AMBULATORY_CARE_PROVIDER_SITE_OTHER): Payer: Medicare Other

## 2020-09-14 DIAGNOSIS — Z7901 Long term (current) use of anticoagulants: Secondary | ICD-10-CM | POA: Diagnosis not present

## 2020-09-14 LAB — POCT INR: INR: 3.7 — AB (ref 2.0–3.0)

## 2020-09-14 NOTE — Patient Instructions (Addendum)
Pre visit review using our clinic review tool, if applicable. No additional management support is needed unless otherwise documented below in the visit note.  Hold dose today and then change weekly dose to take 2.5mg  daily except take 5 mg on Mon, Wed, and Fri.  Recheck in 4 wks.

## 2020-10-12 ENCOUNTER — Other Ambulatory Visit: Payer: Self-pay

## 2020-10-12 ENCOUNTER — Ambulatory Visit (INDEPENDENT_AMBULATORY_CARE_PROVIDER_SITE_OTHER): Payer: Medicare Other

## 2020-10-12 DIAGNOSIS — Z7901 Long term (current) use of anticoagulants: Secondary | ICD-10-CM | POA: Diagnosis not present

## 2020-10-12 LAB — POCT INR: INR: 3 (ref 2.0–3.0)

## 2020-10-12 NOTE — Patient Instructions (Addendum)
Pre visit review using our clinic review tool, if applicable. No additional management support is needed unless otherwise documented below in the visit note.  Continue weekly dose to take 2.5mg  daily except take 5 mg on Mon, Wed, and Fri.  Recheck in 4 wks.

## 2020-10-28 ENCOUNTER — Ambulatory Visit (INDEPENDENT_AMBULATORY_CARE_PROVIDER_SITE_OTHER): Payer: Medicare Other

## 2020-10-28 DIAGNOSIS — I441 Atrioventricular block, second degree: Secondary | ICD-10-CM | POA: Diagnosis not present

## 2020-11-03 LAB — CUP PACEART REMOTE DEVICE CHECK
Date Time Interrogation Session: 20220502101959
Implantable Lead Implant Date: 19930223
Implantable Lead Implant Date: 19930223
Implantable Lead Location: 753859
Implantable Lead Location: 753860
Implantable Pulse Generator Implant Date: 20130725
Pulse Gen Model: 2210
Pulse Gen Serial Number: 7364115

## 2020-11-09 ENCOUNTER — Other Ambulatory Visit: Payer: Self-pay

## 2020-11-09 ENCOUNTER — Ambulatory Visit (INDEPENDENT_AMBULATORY_CARE_PROVIDER_SITE_OTHER): Payer: Medicare Other

## 2020-11-09 DIAGNOSIS — Z7901 Long term (current) use of anticoagulants: Secondary | ICD-10-CM | POA: Diagnosis not present

## 2020-11-09 LAB — POCT INR: INR: 2.5 (ref 2.0–3.0)

## 2020-11-09 NOTE — Patient Instructions (Addendum)
Pre visit review using our clinic review tool, if applicable. No additional management support is needed unless otherwise documented below in the visit note.  Continue weekly dose to take 2.5mg daily except take 5 mg on Mon, Wed, and Fri.  Recheck in 4 wks. 

## 2020-11-10 ENCOUNTER — Other Ambulatory Visit: Payer: Self-pay | Admitting: Internal Medicine

## 2020-11-15 ENCOUNTER — Encounter: Payer: Medicare Other | Admitting: Internal Medicine

## 2020-11-15 DIAGNOSIS — Z952 Presence of prosthetic heart valve: Secondary | ICD-10-CM

## 2020-11-15 DIAGNOSIS — I5022 Chronic systolic (congestive) heart failure: Secondary | ICD-10-CM

## 2020-11-15 DIAGNOSIS — I441 Atrioventricular block, second degree: Secondary | ICD-10-CM

## 2020-11-15 DIAGNOSIS — I10 Essential (primary) hypertension: Secondary | ICD-10-CM

## 2020-11-17 NOTE — Progress Notes (Signed)
Remote pacemaker transmission.   

## 2020-12-07 ENCOUNTER — Ambulatory Visit (INDEPENDENT_AMBULATORY_CARE_PROVIDER_SITE_OTHER): Payer: Medicare Other

## 2020-12-07 ENCOUNTER — Other Ambulatory Visit: Payer: Self-pay

## 2020-12-07 DIAGNOSIS — Z7901 Long term (current) use of anticoagulants: Secondary | ICD-10-CM | POA: Diagnosis not present

## 2020-12-07 LAB — POCT INR: INR: 3 (ref 2.0–3.0)

## 2020-12-07 NOTE — Patient Instructions (Addendum)
Pre visit review using our clinic review tool, if applicable. No additional management support is needed unless otherwise documented below in the visit note.   Continue weekly dose to take 2.5mg  daily except take 5 mg on Mon, Wed, and Fri.  Recheck in 4 wks.

## 2020-12-09 ENCOUNTER — Other Ambulatory Visit: Payer: Self-pay | Admitting: Internal Medicine

## 2020-12-22 ENCOUNTER — Other Ambulatory Visit: Payer: Self-pay

## 2020-12-22 ENCOUNTER — Ambulatory Visit (INDEPENDENT_AMBULATORY_CARE_PROVIDER_SITE_OTHER): Payer: Medicare Other | Admitting: Internal Medicine

## 2020-12-22 ENCOUNTER — Encounter: Payer: Self-pay | Admitting: Internal Medicine

## 2020-12-22 VITALS — BP 132/68 | HR 53 | Ht 71.5 in | Wt 177.2 lb

## 2020-12-22 DIAGNOSIS — I10 Essential (primary) hypertension: Secondary | ICD-10-CM | POA: Diagnosis not present

## 2020-12-22 DIAGNOSIS — I441 Atrioventricular block, second degree: Secondary | ICD-10-CM

## 2020-12-22 DIAGNOSIS — I5022 Chronic systolic (congestive) heart failure: Secondary | ICD-10-CM

## 2020-12-22 DIAGNOSIS — Z952 Presence of prosthetic heart valve: Secondary | ICD-10-CM

## 2020-12-22 MED ORDER — ENTRESTO 24-26 MG PO TABS: 1.0000 | ORAL_TABLET | Freq: Two times a day (BID) | ORAL | 3 refills | Status: DC

## 2020-12-22 NOTE — Progress Notes (Signed)
PCP: [name], MD   Primary EP:  Dr Johney Frame  Noah Thompson is a 80 y.o. male who presents today for routine electrophysiology followup.  Since last being seen in our clinic, the patient reports doing very well.  Today, he denies symptoms of palpitations, chest pain, shortness of breath,  lower extremity edema, dizziness, presyncope, or syncope.  The patient is otherwise without complaint today.   Past Medical History:  Diagnosis Date   CAP (community acquired pneumonia) 07/25/2017   Cataract    left eye   CHF (congestive heart failure) (HCC)    Complete heart block (HCC)    a. 1993 s/p PPM 2/2 perioperative heart block;  b. 01/2012 s/p SJM Accent DC PPM upgrade.   Depression    GERD (gastroesophageal reflux disease)    History of nephritis    a. as child.   History of pneumonia    Hyperlipidemia    Hypertension    NICM (nonischemic cardiomyopathy) (HCC)    a. 06/2012 Echo: EF 45-50%;  b. 08/2013 Echo: EF 20-25%;  c. 10/2013 Adenosine MV: Fixed septal defect likely representing LBBB-related artifact, no reversible ischemia, EF 43%, low risk.   Paroxysmal atrial tachycardia (HCC)    a. 07/2013 noted on device interrogation.   Presence of permanent cardiac pacemaker 2013   RA (rheumatoid arthritis) (HCC)    S/P aortic valve replacement    a. 1993 s/p SJM mechanical AVR for aortic stenosis - chronic coumadin.   Shortness of breath dyspnea    Past Surgical History:  Procedure Laterality Date   ANAL FISSURE REPAIR  1970s   AORTIC VALVE REPLACEMENT  1993   St. Jude Mechanical Valve   CATARACT EXTRACTION, BILATERAL Bilateral 02/2020   1 wk apart   INSERT / REPLACE / REMOVE PACEMAKER     PACEMAKER INSERTION  1993   for AV block, not pacemaker dependant   PERMANENT PACEMAKER GENERATOR CHANGE N/A 01/25/2012   Procedure: PERMANENT PACEMAKER GENERATOR CHANGE;  Surgeon: [name], MD;  Location: [facility] CATH LAB;  Service: Cardiovascular;  Laterality: N/A;    ROS- all systems  are reviewed and negative except as per HPI above  Current Outpatient Medications  Medication Sig Dispense Refill   alum & mag hydroxide-simeth (MAALOX/MYLANTA) 200-200-20 MG/5ML suspension Take 15 mLs by mouth every 6 (six) hours as needed for indigestion or heartburn.     loperamide (IMODIUM) 2 MG capsule Take 1 capsule (2 mg total) by mouth 4 (four) times daily as needed for diarrhea or loose stools.     loratadine (CLARITIN) 10 MG tablet Take 1 tablet (10 mg total) by mouth daily as needed for allergies.     metoprolol succinate (TOPROL-XL) 50 MG 24 hr tablet Take 1 tablet (50 mg total) by mouth daily. Pt needs to keep upcoming appt in June for further refills 30 tablet 0   omeprazole (PRILOSEC) 40 MG capsule Take 1 capsule (40 mg total) by mouth daily. Take daily for 3 days then as needed 30 capsule 3   sacubitril-valsartan (ENTRESTO) 24-26 MG Take 1 tablet by mouth 2 (two) times daily. 180 tablet 3   warfarin (COUMADIN) 5 MG tablet TAKE AS DIRECTED BY  COUMADIN  CLINIC 80 tablet 1   No current facility-administered medications for this visit.    Physical Exam: Vitals:   12/22/20 1433  BP: 132/68  Pulse: (!) 53  SpO2: 96%  Weight: 177 lb 3.2 oz (80.4 kg)  Height: 5' 11.5" (1.816 m)  GEN- The patient is well appearing, alert and oriented x 3 today.   Head- normocephalic, atraumatic Eyes-  Sclera clear, conjunctiva pink Ears- hearing intact Oropharynx- clear Lungs- Clear to ausculation bilaterally, normal work of breathing Chest- pacemaker pocket is well healed Heart- Regular rate and rhythm, no murmurs, rubs or gallops, PMI not laterally displaced GI- soft, NT, ND, + BS Extremities- no clubbing, cyanosis, or edema  Pacemaker interrogation- reviewed in detail today,  See PACEART report  ekg tracing ordered today is personally reviewed and shows sinus with V pacing  Assessment and Plan:  1. 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 Chronic systolic dysfunction EF 40% We discussed CRT upgrade. He remains clear that he is not interested at this time.  He has very few symptoms.  We will therefore opt for medicine optimization instead. Stop losartan.  Start entresto, with pharmacy to assist with dosing. Return to see EP APP in 3 months for further medicine optimzation  3. HTN Continue medical therapy with cozaar 50mg  daily  4. Mechanical AV Continue coumadin Echo 2021 reviewed  Risks, benefits and potential toxicities for medications prescribed and/or refilled reviewed with patient today.   Return to see EP APP in 3 months  I will see in a year  2022 MD, [facility] 12/22/2020 3:15 PM

## 2020-12-22 NOTE — Patient Instructions (Addendum)
       Medication Instructions:  Stop Losartan  Start Entresto 24-26 mg two times daily Your physician recommends that you continue on your current medications as directed. Please refer to the Current Medication list given to you today.  Labwork: None ordered.  Testing/Procedures: None ordered.  Follow-Up: Your physician wants you to follow-up in: 3 months with Otilio Saber. PA  Remote monitoring is used to monitor your Pacemaker from home. This monitoring reduces the number of office visits required to check your device to one time per year. It allows [place] to keep an eye on the functioning of your device to ensure it is working properly. You are scheduled for a device check from home on 01/27/21. You may send your transmission at any time that day. If you have a wireless device, the transmission will be sent automatically. After your physician reviews your transmission, you will receive a postcard with your next transmission date.  Any Other Special Instructions Will Be Listed Below (If Applicable).  If you need a refill on your cardiac medications before your next appointment, please call your pharmacy.

## 2020-12-27 ENCOUNTER — Telehealth: Payer: Self-pay | Admitting: Internal Medicine

## 2020-12-27 NOTE — Telephone Encounter (Signed)
Pts wife states that they cannot afford sacubitril-valsartan (ENTRESTO) 24-26 MG.... may need patient assistance, or additional options, please advise

## 2020-12-28 MED ORDER — METOPROLOL SUCCINATE ER 50 MG PO TB24: 50.0000 mg | ORAL_TABLET | Freq: Every day | ORAL | 1 refills | Status: DC

## 2020-12-28 NOTE — Telephone Encounter (Signed)
**Note De-Identified Kapena Hamme Obfuscation** I s/w the pts wife Noah Thompson (Pt gave verbal permission) concerning Noah Thompson cost. We discussed the pt applying for asst through [employer] pt asst [employer] and she is interested.  I gave her [employer] pt asst [employer]'s phone number (as well as their web site) and advised her to contact them with questions concerning their Entresto program and the pts eligibility to be approved for the program.  She is aware to either print an application offline or to request that [employer] Pt Asst [employer] mails them one to their home address when she speaks with them.  She is aware to complete the pts part of the application, obtain required documents per [employer] pt asst (if any), and to bring all to Dr Noah Thompson office at [facility] on [address] in [place].  I did advise Noah Thompson that the pt is to continue to take his Losartan dose until we get his Noah Thompson cost worked out and she confirmed that he is still taking it.  She requested that I e-scribe the pts Metoprolol RX to [facility] to refill for a 90 day supply which I have done with 1 refill.  She thanked me for our assistance.

## 2021-01-04 ENCOUNTER — Ambulatory Visit (INDEPENDENT_AMBULATORY_CARE_PROVIDER_SITE_OTHER): Payer: Medicare Other

## 2021-01-04 ENCOUNTER — Other Ambulatory Visit: Payer: Self-pay

## 2021-01-04 DIAGNOSIS — Z0000 Encounter for general adult medical examination without abnormal findings: Secondary | ICD-10-CM

## 2021-01-04 DIAGNOSIS — Z7901 Long term (current) use of anticoagulants: Secondary | ICD-10-CM | POA: Diagnosis not present

## 2021-01-04 LAB — POCT INR: INR: 3.5 — AB (ref 2.0–3.0)

## 2021-01-04 NOTE — Patient Instructions (Addendum)
Pre visit review using our clinic review tool, if applicable. No additional management support is needed unless otherwise documented below in the visit note.  Continue weekly dose to take 2.5mg  daily except take 5 mg on Mon, Wed, and Fri.  Recheck in 6 wks

## 2021-01-07 NOTE — Telephone Encounter (Signed)
**Note De-Identified Shelisha Gautier Obfuscation** A completed [employer] pt asst application for Sherryll Burger was left at the office.  I have completed the provider page and emailed all to Dr Amedeo Plenty nurse so she can obtain his signature , date it and to fax all to [employer] pt [employer]st [employer] at the fax number written on the cover letter included or to place I the to be faxed box in Medical Records.

## 2021-01-11 NOTE — Telephone Encounter (Signed)
Printed patient Asst App/BCBC ins card, signed by MD, and placed in medical records to be faxed.

## 2021-01-11 NOTE — Telephone Encounter (Signed)
**Note De-Identified Mell Mellott Obfuscation** Dr Johney Frame is out of the office this week so I have completed the provider page of another [employer] pt asst application and added to the pts original application and emailed all to Dr Jenel Lucks nurse so she can obtain Dr Anne Fu (DOD) signature, date it , and to fax all to [employer] pt asst [employer] at the fax number written on the cover letter included.

## 2021-01-14 NOTE — Telephone Encounter (Signed)
**Note De-Identified Perrin Gens Obfuscation** Letter received Noah Thompson fax from [employer] pt asst [employer] stating that they have approved the pt for asst with his Entresto. Approval is valid until 07/02/2021. Pt ID: [id number]  The letter states that they have notified the pt of this approval as well.

## 2021-01-27 ENCOUNTER — Ambulatory Visit (INDEPENDENT_AMBULATORY_CARE_PROVIDER_SITE_OTHER): Payer: Medicare Other

## 2021-01-27 DIAGNOSIS — I441 Atrioventricular block, second degree: Secondary | ICD-10-CM

## 2021-01-27 LAB — CUP PACEART REMOTE DEVICE CHECK
Battery Remaining Longevity: 12 mo
Battery Remaining Percentage: 9 %
Battery Voltage: 2.75 V
Brady Statistic AP VP Percent: 1 %
Brady Statistic AP VS Percent: 27 %
Brady Statistic AS VP Percent: 1 %
Brady Statistic AS VS Percent: 72 %
Brady Statistic RA Percent Paced: 25 %
Brady Statistic RV Percent Paced: 1 %
Date Time Interrogation Session: 20220728024846
Implantable Lead Implant Date: 19930223
Implantable Lead Implant Date: 19930223
Implantable Lead Location: 753859
Implantable Lead Location: 753860
Implantable Pulse Generator Implant Date: 20130725
Lead Channel Impedance Value: 530 Ohm
Lead Channel Impedance Value: 530 Ohm
Lead Channel Pacing Threshold Amplitude: 1.25 V
Lead Channel Pacing Threshold Amplitude: 1.25 V
Lead Channel Pacing Threshold Pulse Width: 0.5 ms
Lead Channel Pacing Threshold Pulse Width: 1 ms
Lead Channel Sensing Intrinsic Amplitude: 12 mV
Lead Channel Sensing Intrinsic Amplitude: 4.9 mV
Lead Channel Setting Pacing Amplitude: 2.5 V
Lead Channel Setting Pacing Amplitude: 2.5 V
Lead Channel Setting Pacing Pulse Width: 0.5 ms
Lead Channel Setting Sensing Sensitivity: 2 mV
Pulse Gen Model: 2210
Pulse Gen Serial Number: 7364115

## 2021-02-14 ENCOUNTER — Telehealth: Payer: Self-pay

## 2021-02-14 NOTE — Telephone Encounter (Signed)
Pt has apt for tomorrow for coumadin clinic. Need to RS apt for Friday, 8/19, between 8 and 4.

## 2021-02-15 ENCOUNTER — Ambulatory Visit: Payer: Medicare Other

## 2021-02-18 ENCOUNTER — Other Ambulatory Visit: Payer: Self-pay

## 2021-02-18 ENCOUNTER — Ambulatory Visit (INDEPENDENT_AMBULATORY_CARE_PROVIDER_SITE_OTHER): Payer: Medicare Other

## 2021-02-18 DIAGNOSIS — Z7901 Long term (current) use of anticoagulants: Secondary | ICD-10-CM

## 2021-02-18 LAB — POCT INR: INR: 3.3 — AB (ref 2.0–3.0)

## 2021-02-18 NOTE — Patient Instructions (Addendum)
Pre visit review using our clinic review tool, if applicable. No additional management support is needed unless otherwise documented below in the visit note.  Continue weekly dose to take 2.5mg  daily except take 5 mg on Mon, Wed, and Fri.  Recheck in 6 wks per pt request

## 2021-02-22 NOTE — Progress Notes (Signed)
Remote pacemaker transmission.   

## 2021-04-01 ENCOUNTER — Ambulatory Visit: Payer: Medicare Other

## 2021-04-03 NOTE — Progress Notes (Signed)
Cardiology Office Note Date:  04/03/2021  Patient ID:  Noah Thompson, Noah Thompson [birth year], MRN [id number] PCP:  [name], MD  Electrophysiologist: Dr. Johney Frame     Chief Complaint: 50mo follow up  History of Present Illness: Noah Thompson is a 80 y.o. male with history of HTN, HLD, chronic CHF (systoli), NICM, Atach, RA, VHD s/p AVR (mechanical, 1993), CHB w/PPM  He comes intoday to be seen for Dr. Johney Frame, last seen by him June 2022, at that time doing well, VP only 2%, revisited upgrade to CRT device with LVEF 40%, though the patient not interested.   Losartan was changed to [facility] with plans to see APP in 6mo for medicine optimization.  TODAY He is with his wife today. He feels very well, says he thinks he feels like he has better energy, and overall sense of wellbeing is improved. No CP, palpitations or cardiac awareness. He does not formally exercise but is active and stays busy/moving with chores around the house and so forth, denies SOB or DOE No dizzy spells, near syncope or syncope.  His PMD manages his lipids, labs, INR No bleeding or signs of bleeding  Device information Abbott dual chamber PPM implanted 08/26/1991 > last gen change 01/25/2012   Past Medical History:  Diagnosis Date   CAP (community acquired pneumonia) 07/25/2017   Cataract    left eye   CHF (congestive heart failure) (HCC)    Complete heart block (HCC)    a. 1993 s/p PPM 2/2 perioperative heart block;  b. 01/2012 s/p SJM Accent DC PPM upgrade.   Depression    GERD (gastroesophageal reflux disease)    History of nephritis    a. as child.   History of pneumonia    Hyperlipidemia    Hypertension    NICM (nonischemic cardiomyopathy) (HCC)    a. 06/2012 Echo: EF 45-50%;  b. 08/2013 Echo: EF 20-25%;  c. 10/2013 Adenosine MV: Fixed septal defect likely representing LBBB-related artifact, no reversible ischemia, EF 43%, low risk.   Paroxysmal atrial tachycardia (HCC)    a. 07/2013 noted on device  interrogation.   Presence of permanent cardiac pacemaker 2013   RA (rheumatoid arthritis) (HCC)    S/P aortic valve replacement    a. 1993 s/p SJM mechanical AVR for aortic stenosis - chronic coumadin.   Shortness of breath dyspnea     Past Surgical History:  Procedure Laterality Date   ANAL FISSURE REPAIR  1970s   AORTIC VALVE REPLACEMENT  1993   St. Jude Mechanical Valve   CATARACT EXTRACTION, BILATERAL Bilateral 02/2020   1 wk apart   INSERT / REPLACE / REMOVE PACEMAKER     PACEMAKER INSERTION  1993   for AV block, not pacemaker dependant   PERMANENT PACEMAKER GENERATOR CHANGE N/A 01/25/2012   Procedure: PERMANENT PACEMAKER GENERATOR CHANGE;  Surgeon: [name], MD;  Location: [facility] CATH LAB;  Service: Cardiovascular;  Laterality: N/A;    Current Outpatient Medications  Medication Sig Dispense Refill   alum & mag hydroxide-simeth (MAALOX/MYLANTA) 200-200-20 MG/5ML suspension Take 15 mLs by mouth every 6 (six) hours as needed for indigestion or heartburn.     loperamide (IMODIUM) 2 MG capsule Take 1 capsule (2 mg total) by mouth 4 (four) times daily as needed for diarrhea or loose stools.     loratadine (CLARITIN) 10 MG tablet Take 1 tablet (10 mg total) by mouth daily as needed for allergies.     metoprolol succinate (TOPROL-XL) 50 MG 24 hr tablet  Take 1 tablet (50 mg total) by mouth daily. Pt needs to keep upcoming appt in June for further refills 90 tablet 1   omeprazole (PRILOSEC) 40 MG capsule Take 1 capsule (40 mg total) by mouth daily. Take daily for 3 days then as needed 30 capsule 3   sacubitril-valsartan (ENTRESTO) 24-26 MG Take 1 tablet by mouth 2 (two) times daily. 180 tablet 3   warfarin (COUMADIN) 5 MG tablet TAKE AS DIRECTED BY  COUMADIN  CLINIC 80 tablet 1   No current facility-administered medications for this visit.    Allergies:   Coreg [carvedilol], Lisinopril, and Tessalon [benzonatate]   Social History:  The patient  reports that he has never smoked. He has  never used smokeless tobacco. He reports that he does not drink alcohol and does not use drugs.   Family History:  The patient's family history includes Arthritis in his father and mother; Congestive Heart Failure in his father; Heart disease in his father; Hypertension in his father and mother; Prostate cancer (age of onset: 33) in his father.  ROS:  Please see the history of present illness.    All other systems are reviewed and otherwise negative.   PHYSICAL EXAM:  VS:  There were no vitals taken for this visit. BMI: There is no height or weight on file to calculate BMI. Well nourished, well developed, in no acute distress HEENT: normocephalic, atraumatic Neck: no JVD, carotid bruits or masses Cardiac:  RRR; valve is appreciated, no significant murmurs, no rubs, or gallops Lungs:  CTA b/l, no wheezing, rhonchi or rales Abd: soft, nontender MS: no deformity or atrophy Ext:  no edema Skin: warm and dry, no rash Neuro:  No gross deficits appreciated Psych: euthymic mood, full affect  PPM site is stable, (R side) no tethering or discomfort   EKG:  not done today  Device interrogation done today and reviewed by myself:  Battery estimate is 8-11 mo Lead measurements are good Arrives AS/VS AP 24% VP <1% No AMS One PMT with successful algorithm    12/02/2019: TTE IMPRESSIONS   1. Left ventricular ejection fraction, by estimation, is 40 to 45%. The  left ventricle has mildly decreased function. The left ventricle  demonstrates global hypokinesis. Left ventricular diastolic parameters are  consistent with Grade I diastolic  dysfunction (impaired relaxation).   2. Right ventricular systolic function is normal. The right ventricular  size is normal. There is normal pulmonary artery systolic pressure. The  estimated right ventricular systolic pressure is 35.2 mmHg.   3. Left atrial size was moderately dilated.   4. The mitral valve is normal in structure. Moderate mitral valve   regurgitation. No evidence of mitral stenosis.   5. Tricuspid valve regurgitation is moderate.   6. Prior velocity 3.35m/s, gradient 23 mmHg in 2018. The aortic valve was  not well visualized. Aortic valve regurgitation is not visualized. No  aortic stenosis is present. Aortic valve mean gradient measures 23.0 mmHg.  Aortic valve Vmax measures 3.21  m/s.   7. Aortic dilatation noted. 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   8. The inferior vena cava is normal in size with greater than 50%  respiratory variability, suggesting right atrial pressure of 3 mmHg.   Comparison(s): Prior images reviewed side by side. The left ventricular  function has improved. Prior EF 30-35%.    12/22/2016: TTE Study Conclusions  - Left ventricle: The cavity size was moderately dilated. Wall    thickness  was normal. Systolic function was moderately to    severely reduced. The estimated ejection fraction was in the    range of 30% to 35%. Features are consistent with a pseudonormal    left ventricular filling pattern, with concomitant abnormal    relaxation and increased filling pressure (grade 2 diastolic    dysfunction).  - Aortic valve: Severely calcified annulus. Severely calcified    leaflets. There was mild to moderate stenosis. There was trivial    regurgitation.  - Mitral valve: Mildly to moderately calcified annulus. There was    mild to moderate regurgitation. Valve area by pressure half-time:    2.42 cm^2.    Recent Labs: 05/24/2020: ALT 15; BUN 20; Creatinine, Ser 1.30; Hemoglobin 13.9; Platelets 247.0; Potassium 4.7; Sodium 140  05/24/2020: Cholesterol 222; HDL 48.70; LDL Cholesterol 138; Total CHOL/HDL Ratio 5; Triglycerides 174.0; VLDL 34.8   CrCl cannot be calculated (Patient's most recent lab result is older than the maximum 21 days allowed.).   Wt Readings from Last 3 Encounters:  12/22/20 177 lb 3.2 oz (80.4 kg)  06/01/20 181 lb 2 oz (82.2 kg)   11/12/18 171 lb (77.6 kg)     Other studies reviewed: Additional studies/records reviewed today include: summarized above  ASSESSMENT AND PLAN:  PPM Will reach ERI in the next year I have discussed with device clinic, start monthly battery checks Pt aware  NICM No symptoms or exam findings of volume OL On Toprol and Entresto Will up titrate his entresto Check echo  BMET  Mechanical AVR On warfarin, monitored and managed with his PMD Stable function by last echo  HTN Looks OK, has room for more entresto   Disposition: F/u with monthly battery checks, he gets his Entresto via patient assistance, I will staff message Elease Hashimoto to reach out to the patient/his wife on how that dose change happens via the program  Current medicines are reviewed at length with the patient today.  The patient did not have any concerns regarding medicines.  Norma Fredrickson, PA-C 04/03/2021 10:47 AM     [facility] [address] [place] [address] [phone] (office)  [phone] (fax)

## 2021-04-05 ENCOUNTER — Other Ambulatory Visit: Payer: Self-pay

## 2021-04-05 ENCOUNTER — Encounter: Payer: Self-pay | Admitting: Physician Assistant

## 2021-04-05 ENCOUNTER — Ambulatory Visit (INDEPENDENT_AMBULATORY_CARE_PROVIDER_SITE_OTHER): Payer: Medicare Other | Admitting: Physician Assistant

## 2021-04-05 VITALS — BP 142/80 | HR 58 | Ht 71.5 in | Wt 180.8 lb

## 2021-04-05 DIAGNOSIS — Z952 Presence of prosthetic heart valve: Secondary | ICD-10-CM | POA: Diagnosis not present

## 2021-04-05 DIAGNOSIS — Z79899 Other long term (current) drug therapy: Secondary | ICD-10-CM

## 2021-04-05 DIAGNOSIS — Z950 Presence of cardiac pacemaker: Secondary | ICD-10-CM | POA: Diagnosis not present

## 2021-04-05 DIAGNOSIS — I428 Other cardiomyopathies: Secondary | ICD-10-CM | POA: Diagnosis not present

## 2021-04-05 DIAGNOSIS — I10 Essential (primary) hypertension: Secondary | ICD-10-CM

## 2021-04-05 MED ORDER — SACUBITRIL-VALSARTAN 49-51 MG PO TABS: 1.0000 | ORAL_TABLET | Freq: Two times a day (BID) | ORAL | 3 refills | Status: DC

## 2021-04-05 NOTE — Patient Instructions (Signed)
Medication Instructions:   START TAKING ENTRESTO 49-51 TWICE A DAY   *If you need a refill on your cardiac medications before your next appointment, please call your pharmacy*   Lab Work: BMET TODAY   If you have labs (blood work) drawn today and your tests are completely normal, you will receive your results only by: MyChart Message (if you have MyChart) OR A paper copy in the mail If you have any lab test that is abnormal or we need to change your treatment, we will call you to review the results.   Testing/Procedures: Your physician has requested that you have an echocardiogram. Echocardiography is a painless test that uses sound waves to create images of your heart. It provides your doctor with information about the size and shape of your heart and how well your heart's chambers and valves are working. This procedure takes approximately one hour. There are no restrictions for this procedure.    Follow-Up: At [facility], you and your health needs are our priority.  As part of our continuing mission to provide you with exceptional heart care, we have created designated Provider Care Teams.  These Care Teams include your primary Cardiologist (physician) and Advanced Practice Providers (APPs -  Physician Assistants and Nurse Practitioners) who all work together to provide you with the care you need, when you need it.  We recommend signing up for the patient portal called "MyChart".  Sign up information is provided on this After Visit Summary.  MyChart is used to connect with patients for Virtual Visits (Telemedicine).  Patients are able to view lab/test results, encounter notes, upcoming appointments, etc.  Non-urgent messages can be sent to your provider as well.   To learn more about what you can do with MyChart, go to [email or web address].    Your next appointment:   3 month(s)  The format for your next appointment:   In Person  Provider:   You will see one of the following  Advanced Practice Providers on your designated Care Team:   Francis Dowse, [place]      Other Instructions

## 2021-04-06 ENCOUNTER — Other Ambulatory Visit: Payer: Self-pay

## 2021-04-06 ENCOUNTER — Telehealth: Payer: Self-pay

## 2021-04-06 LAB — BASIC METABOLIC PANEL
BUN/Creatinine Ratio: 15 (ref 10–24)
BUN: 17 mg/dL (ref 8–27)
CO2: 22 mmol/L (ref 20–29)
Calcium: 9.5 mg/dL (ref 8.6–10.2)
Chloride: 106 mmol/L (ref 96–106)
Creatinine, Ser: 1.16 mg/dL (ref 0.76–1.27)
Glucose: 91 mg/dL (ref 70–99)
Potassium: 4.9 mmol/L (ref 3.5–5.2)
Sodium: 147 mmol/L — ABNORMAL HIGH (ref 134–144)
eGFR: 64 mL/min/{1.73_m2} (ref >59)

## 2021-04-06 MED ORDER — SACUBITRIL-VALSARTAN 49-51 MG PO TABS: 1.0000 | ORAL_TABLET | Freq: Two times a day (BID) | ORAL | 3 refills | Status: DC

## 2021-04-06 NOTE — Telephone Encounter (Signed)
**Note De-Identified Elza Varricchio Obfuscation** Renee, I removed Entresto 24-26 mg from the pts med list and e-scribed Entresto 49-51 mg #90 with 3 refills to [facility] (Pharmacy for [employer] pt asst[facility] with a message to the pharmacist "increase in dose". I did call to let the pts wife know that I sent the pts increased dose of Entresto 49-51 mg to [facility] to fill and that if they have not received it Dvaughn Fickle mail within 7 to 10 business days to call me Larita Fife) back at [facility] at [phone]. She thanked me for calling them with this update. Thanks, Larita Fife  ===View-only below this line=== ----- Message ----- From: Sheilah Pigeon, PA-C Sent: 04/05/2021   3:08 PM EDT To: Lorelle Formosa Ramond Darnell, LPN  I have up-titrated the Sherryll Burger, he gets his medicines Shaneen Reeser patient assistance.  I am not sure how that works when we change the dose.  Can you help [place]?  Reach out to the patient/his wife when you can. THANKS SO MUCH

## 2021-04-07 ENCOUNTER — Other Ambulatory Visit: Payer: Self-pay | Admitting: *Deleted

## 2021-04-07 DIAGNOSIS — Z79899 Other long term (current) drug therapy: Secondary | ICD-10-CM

## 2021-04-08 ENCOUNTER — Ambulatory Visit (INDEPENDENT_AMBULATORY_CARE_PROVIDER_SITE_OTHER): Payer: Medicare Other

## 2021-04-08 ENCOUNTER — Other Ambulatory Visit: Payer: Self-pay

## 2021-04-08 DIAGNOSIS — Z7901 Long term (current) use of anticoagulants: Secondary | ICD-10-CM

## 2021-04-08 LAB — POCT INR: INR: 2.8 (ref 2.0–3.0)

## 2021-04-08 NOTE — Patient Instructions (Addendum)
Pre visit review using our clinic review tool, if applicable. No additional management support is needed unless otherwise documented below in the visit note.  Continue weekly dose to take 2.5mg daily except take 5 mg on Mon, Wed, and Fri.  Recheck in 6 wks per pt request 

## 2021-04-12 ENCOUNTER — Other Ambulatory Visit: Payer: Self-pay

## 2021-04-12 MED ORDER — SACUBITRIL-VALSARTAN 49-51 MG PO TABS: 1.0000 | ORAL_TABLET | Freq: Two times a day (BID) | ORAL | 3 refills | Status: DC

## 2021-04-19 ENCOUNTER — Ambulatory Visit (HOSPITAL_COMMUNITY): Payer: Medicare Other | Attending: Cardiovascular Disease

## 2021-04-19 ENCOUNTER — Other Ambulatory Visit: Payer: Self-pay

## 2021-04-19 DIAGNOSIS — I428 Other cardiomyopathies: Secondary | ICD-10-CM

## 2021-04-19 LAB — ECHOCARDIOGRAM COMPLETE
AR max vel: 0.61 cm2
AV Area VTI: 0.63 cm2
AV Area mean vel: 0.68 cm2
AV Mean grad: 33 mmHg
AV Peak grad: 60.8 mmHg
Ao pk vel: 3.9 m/s
Area-P 1/2: 2.26 cm2
S' Lateral: 4.5 cm

## 2021-04-19 NOTE — Telephone Encounter (Signed)
**Note De-Identified Subrena Devereux Obfuscation** The pts wife (DPR) called to let me know they have still not received the pts increased dose of Entresto 49-51 mg from [employer] pt asst yest.  I called [employer] and s/w Dewayne Hatch who advised me that even though I e-scribed the pts Entresto 49-51 mg RX with a note to the pharmacist at [facility] (pharmacy for [employer] pt asst) that this is a dose increase, that the pt still has to call them to order their Sherryll Burger because it is being shipped to the pts address.  I called the pts wife and made her aware of this. She states that the pt ran out of his Sherryll Burger today. I advised her that we will leave him 1 bottle of Entresto 49-51 mg samples in the front office at Dr Allred's office for them to pick up while at the office today for the pts Echo.  She thanked me for our asst and states that she is [employer] now.

## 2021-04-26 ENCOUNTER — Telehealth: Payer: Self-pay | Admitting: Physician Assistant

## 2021-04-26 LAB — CUP PACEART REMOTE DEVICE CHECK
Battery Remaining Longevity: 9 mo
Battery Remaining Percentage: 7 %
Battery Voltage: 2.74 V
Brady Statistic AP VP Percent: 1 %
Brady Statistic AP VS Percent: 23 %
Brady Statistic AS VP Percent: 1 %
Brady Statistic AS VS Percent: 76 %
Brady Statistic RA Percent Paced: 20 %
Brady Statistic RV Percent Paced: 1 %
Date Time Interrogation Session: 20221025121926
Implantable Lead Implant Date: 19930223
Implantable Lead Implant Date: 19930223
Implantable Lead Location: 753859
Implantable Lead Location: 753860
Implantable Pulse Generator Implant Date: 20130725
Lead Channel Impedance Value: 450 Ohm
Lead Channel Impedance Value: 460 Ohm
Lead Channel Pacing Threshold Amplitude: 1.25 V
Lead Channel Pacing Threshold Amplitude: 1.25 V
Lead Channel Pacing Threshold Pulse Width: 0.5 ms
Lead Channel Pacing Threshold Pulse Width: 1 ms
Lead Channel Sensing Intrinsic Amplitude: 4.3 mV
Lead Channel Sensing Intrinsic Amplitude: 9.5 mV
Lead Channel Setting Pacing Amplitude: 2.5 V
Lead Channel Setting Pacing Amplitude: 2.5 V
Lead Channel Setting Pacing Pulse Width: 0.5 ms
Lead Channel Setting Sensing Sensitivity: 2 mV
Pulse Gen Model: 2210
Pulse Gen Serial Number: 7364115

## 2021-04-26 NOTE — Telephone Encounter (Signed)
Patient is returning a phone call to Noah Thompson or Noah Thompson about her device

## 2021-04-26 NOTE — Telephone Encounter (Signed)
I spoke with the patient to let him know we need a transmission from his monitor. I helped him get started but his monitor is connected to the land line. I told him I will call him back when the transmission is completed.

## 2021-04-26 NOTE — Telephone Encounter (Signed)
Transmission received.

## 2021-04-28 ENCOUNTER — Other Ambulatory Visit: Payer: Self-pay

## 2021-04-28 ENCOUNTER — Other Ambulatory Visit: Payer: Medicare Other | Admitting: *Deleted

## 2021-04-28 DIAGNOSIS — Z79899 Other long term (current) drug therapy: Secondary | ICD-10-CM | POA: Diagnosis not present

## 2021-04-29 LAB — BASIC METABOLIC PANEL
BUN/Creatinine Ratio: 16 (ref 10–24)
BUN: 19 mg/dL (ref 8–27)
CO2: 26 mmol/L (ref 20–29)
Calcium: 9.2 mg/dL (ref 8.6–10.2)
Chloride: 105 mmol/L (ref 96–106)
Creatinine, Ser: 1.17 mg/dL (ref 0.76–1.27)
Glucose: 110 mg/dL — ABNORMAL HIGH (ref 70–99)
Potassium: 4.4 mmol/L (ref 3.5–5.2)
Sodium: 145 mmol/L — ABNORMAL HIGH (ref 134–144)
eGFR: 63 mL/min/{1.73_m2} (ref >59)

## 2021-05-02 ENCOUNTER — Telehealth: Payer: Self-pay | Admitting: *Deleted

## 2021-05-02 NOTE — Telephone Encounter (Signed)
Attempted patient again 3rd time.  Lvm for wife to call back to set up a good time for TEE. Left number  to call back  [phone] to phone directly to me at current location in office where I will be sitting at for a couple of days.

## 2021-05-03 ENCOUNTER — Telehealth: Payer: Self-pay | Admitting: *Deleted

## 2021-05-03 NOTE — Telephone Encounter (Signed)
-----   Message from [facility], [place] sent at 04/22/2021  4:22 PM EDT ----- Spoke with patient and wife, he would like to proceed with TEE. Can you call and get them schedule when it works for them  Thanks ----- Message ----- From: Oleta Mouse, CMA Sent: 04/22/2021   4:08 PM EDT To: Wylie Hail  Spoke with patient wife about  results and recommendations. Patient wife would like to talk to provider more about results and recommendations.

## 2021-05-03 NOTE — Telephone Encounter (Signed)
Spoke with patient wife who states the patient still has not decided if he would like to go ahead with TEE.Though very appreciative of Renee discussing with him about the recommendation of the  procedure. Patient would like to have another medication option perhaps and possibly redoing Echo to see if that helped. Another office visit in person also suggested by patient and wife to further talk more. Patient and wife was told Luster Landsberg would be contacted and per her recommendations will contact back in the am with that. Patient wife thanked me for patience and understanding because of the multiple calls.

## 2021-05-06 NOTE — Telephone Encounter (Addendum)
Patients wife wishes to talk to Dr. Johney Frame at the upcoming appointment about options going forward stating she still doesn't fully understand what these results mean. Wife asked to go over echo results again to help them better understand. Confirmed new patient appointment with general cardiology.  Verbalized understanding.

## 2021-05-11 ENCOUNTER — Other Ambulatory Visit: Payer: Self-pay | Admitting: Family Medicine

## 2021-05-11 DIAGNOSIS — Z7901 Long term (current) use of anticoagulants: Secondary | ICD-10-CM

## 2021-05-11 NOTE — Telephone Encounter (Signed)
Pt compliant with coumadin management. Sent in refill. 

## 2021-05-13 ENCOUNTER — Other Ambulatory Visit: Payer: Self-pay

## 2021-05-13 ENCOUNTER — Ambulatory Visit (INDEPENDENT_AMBULATORY_CARE_PROVIDER_SITE_OTHER): Payer: Medicare Other | Admitting: Internal Medicine

## 2021-05-13 ENCOUNTER — Encounter: Payer: Self-pay | Admitting: Internal Medicine

## 2021-05-13 VITALS — BP 144/76 | HR 58 | Ht 71.0 in | Wt 182.2 lb

## 2021-05-13 DIAGNOSIS — I428 Other cardiomyopathies: Secondary | ICD-10-CM | POA: Diagnosis not present

## 2021-05-13 DIAGNOSIS — I5022 Chronic systolic (congestive) heart failure: Secondary | ICD-10-CM

## 2021-05-13 DIAGNOSIS — Z952 Presence of prosthetic heart valve: Secondary | ICD-10-CM

## 2021-05-13 DIAGNOSIS — D6869 Other thrombophilia: Secondary | ICD-10-CM | POA: Diagnosis not present

## 2021-05-13 DIAGNOSIS — I441 Atrioventricular block, second degree: Secondary | ICD-10-CM | POA: Diagnosis not present

## 2021-05-13 NOTE — Progress Notes (Signed)
PCP: [name], MD   Primary EP:  Dr Johney Frame  Noah Thompson is a 80 y.o. male who presents today for routine electrophysiology followup.  Since last being seen in our clinic, the patient reports doing very well.  Though asymptomatic, recent echo was markedly abnormal with concerns for aortic valve gradient elevation and moderate to severe MR.  Today, he denies symptoms of palpitations, chest pain, shortness of breath,  lower extremity edema, dizziness, presyncope, or syncope.  The patient is otherwise without complaint today.   Past Medical History:  Diagnosis Date   CAP (community acquired pneumonia) 07/25/2017   Cataract    left eye   CHF (congestive heart failure) (HCC)    Complete heart block (HCC)    a. 1993 s/p PPM 2/2 perioperative heart block;  b. 01/2012 s/p SJM Accent DC PPM upgrade.   Depression    GERD (gastroesophageal reflux disease)    History of nephritis    a. as child.   History of pneumonia    Hyperlipidemia    Hypertension    NICM (nonischemic cardiomyopathy) (HCC)    a. 06/2012 Echo: EF 45-50%;  b. 08/2013 Echo: EF 20-25%;  c. 10/2013 Adenosine MV: Fixed septal defect likely representing LBBB-related artifact, no reversible ischemia, EF 43%, low risk.   Paroxysmal atrial tachycardia (HCC)    a. 07/2013 noted on device interrogation.   Presence of permanent cardiac pacemaker 2013   RA (rheumatoid arthritis) (HCC)    S/P aortic valve replacement    a. 1993 s/p SJM mechanical AVR for aortic stenosis - chronic coumadin.   Shortness of breath dyspnea    Past Surgical History:  Procedure Laterality Date   ANAL FISSURE REPAIR  1970s   AORTIC VALVE REPLACEMENT  1993   [employer]. Jude Mechanical Valve   CATARACT EXTRACTION, BILATERAL Bilateral 02/2020   1 wk apart   INSERT / REPLACE / REMOVE PACEMAKER     PACEMAKER INSERTION  1993   for AV block, not pacemaker dependant   PERMANENT PACEMAKER GENERATOR CHANGE N/A 01/25/2012   Procedure: PERMANENT PACEMAKER  GENERATOR CHANGE;  Surgeon: [name], MD;  Location: [facility] C[facility] LAB;  Service: Cardiovascular;  Laterality: N/A;    ROS- all systems are reviewed and negative except as per HPI above  Current Outpatient Medications  Medication Sig Dispense Refill   alum & mag hydroxide-simeth (MAALOX/MYLANTA) 200-200-20 MG/5ML suspension Take 15 mLs by mouth every 6 (six) hours as needed for indigestion or heartburn.     loperamide (IMODIUM) 2 MG capsule Take 1 capsule (2 mg total) by mouth 4 (four) times daily as needed for diarrhea or loose stools.     loratadine (CLARITIN) 10 MG tablet Take 1 tablet (10 mg total) by mouth daily as needed for allergies.     metoprolol succinate (TOPROL-XL) 50 MG 24 hr tablet Take 1 tablet (50 mg total) by mouth daily. Pt needs to keep upcoming appt in June for further refills 90 tablet 1   omeprazole (PRILOSEC) 40 MG capsule Take 1 capsule (40 mg total) by mouth daily. Take daily for 3 days then as needed 30 capsule 3   sacubitril-valsartan (ENTRESTO) 49-51 MG Take 1 tablet by mouth 2 (two) times daily. 180 tablet 3   warfarin (COUMADIN) 5 MG tablet TAKE 1/2 TABLET BY MOUTH DAILY EXCEPT TAKE 1 TABLET ON MONDAYS, WEDNESDAYS AND FRIDAYS OR AS DIRECTED BY ANTICOAGULATION CLINIC 110 tablet 0   No current facility-administered medications for this visit.  Physical Exam: Vitals:   05/13/21 1355  BP: (!) 144/76  Pulse: (!) 58  SpO2: 98%  Weight: 182 lb 3.2 oz (82.6 kg)  Height: 5\' 11"  (1.803 m)    GEN- The patient is well appearing, alert and oriented x 3 today.   Head- normocephalic, atraumatic Eyes-  Sclera clear, conjunctiva pink Ears- hearing intact Oropharynx- clear Lungs- Clear to ausculation bilaterally, normal work of breathing Chest- pacemaker pocket is well healed Heart- Regular rate and rhythm, mechanical s2,  2/6 SEM at the apex GI- soft, NT, ND, + BS Extremities- no clubbing, cyanosis, or edema  Pacemaker interrogation- reviewed in detail today,  See  PACEART report  ekg tracing ordered today is personally reviewed and shows sinus with LBBB, QRS 166 msec  Echo reviewed at length  Assessment and Plan:  1. Symptomatic sinus bradycardia   Normal pacemaker function See Pace Art report No changes today he is not device dependant today Given reduced EF and LBBB with QRS > 150 msec, I would advise upgrade to CRT once ERI.  He is about 7 months to ERI.  We will need to have him establish with general cardiology for AV and MV evaluation (see below) in the interim.  2. Prosthetic aortic valve stenosis,  moderate to severe MR Very concerning echo reviewed.  I had a long discussion with the patient and his wife today.  I have advised TEE with [facility]/L[facility] to further evaluate the findings.  We discussed at length.  He is very clear in his decision to decline the study at this time.  He states "I worry about costs if my insurance does not pay.  I also worry about the risks of procedures in the hospital.  If I needed another valve surgery, I would die before I would have it done". I will refer to Dr for further evaluation and management Continue coumadin.  Inrs have been therapeutic.  If he decides to proceed with testing prior to his visit with Dr Izora Ribas , he should call my office.  3. Nonischemic CM Likely worsened by #2 above EF 30-35% Consider upgrade to CRT-P or CRT-D pending management of valvular disease above.  Will refer to general cardiology for further medicine titration  Very difficult patient situation.  He is minimally symptomatic and therefore not willing to proceed with further testing at this time.  Refer to Dr Izora Ribas as above I will have Dr Izora Ribas follow his device going forward with plans for upgrade to CRT once ERI.  Ladona Ridgel MD, [facility] 05/13/2021 2:12 PM

## 2021-05-13 NOTE — Patient Instructions (Addendum)
Medication Instructions:  Your physician recommends that you continue on your current medications as directed. Please refer to the Current Medication list given to you today. *If you need a refill on your cardiac medications before your next appointment, please call your pharmacy*  Lab Work: None. If you have labs (blood work) drawn today and your tests are completely normal, you will receive your results only by: MyChart Message (if you have MyChart) OR A paper copy in the mail If you have any lab test that is abnormal or we need to change your treatment, we will call you to review the results.  Testing/Procedures: None.  Follow-Up: At [facility], you and your health needs are our priority.  As part of our continuing mission to provide you with exceptional heart care, we have created designated Provider Care Teams.  These Care Teams include your primary Cardiologist (physician) and Advanced Practice Providers (APPs -  Physician Assistants and Nurse Practitioners) who all work together to provide you with the care you need, when you need it.  Your physician wants you to follow-up in: 6 months with Dr. Ladona Ridgel.     You will receive a reminder letter in the mail two months in advance. If you don't receive a letter, please call our office to schedule the follow-up appointment.  Remote monitoring is used to monitor your Pacemaker from home. This monitoring reduces the number of office visits required to check your device to one time per year. It allows [place] to keep an eye on the functioning of your device to ensure it is working properly. You are scheduled for a device check from home on 05/30/21. You may send your transmission at any time that day. If you have a wireless device, the transmission will be sent automatically. After your physician reviews your transmission, you will receive a postcard with your next transmission date.  We recommend signing up for the patient portal called "MyChart".   Sign up information is provided on this After Visit Summary.  MyChart is used to connect with patients for Virtual Visits (Telemedicine).  Patients are able to view lab/test results, encounter notes, upcoming appointments, etc.  Non-urgent messages can be sent to your provider as well.   To learn more about what you can do with MyChart, go to [email or web address].    Any Other Special Instructions Will Be Listed Below (If Applicable).

## 2021-05-16 LAB — CUP PACEART INCLINIC DEVICE CHECK
Battery Remaining Longevity: 9 mo
Battery Voltage: 2.74 V
Brady Statistic RA Percent Paced: 25 %
Brady Statistic RV Percent Paced: 0.26 %
Date Time Interrogation Session: 20221111145400
Implantable Lead Implant Date: 19930223
Implantable Lead Implant Date: 19930223
Implantable Lead Location: 753859
Implantable Lead Location: 753860
Implantable Pulse Generator Implant Date: 20130725
Lead Channel Impedance Value: 462.5 Ohm
Lead Channel Impedance Value: 462.5 Ohm
Lead Channel Pacing Threshold Amplitude: 1.25 V
Lead Channel Pacing Threshold Amplitude: 1.25 V
Lead Channel Pacing Threshold Pulse Width: 0.5 ms
Lead Channel Pacing Threshold Pulse Width: 1 ms
Lead Channel Sensing Intrinsic Amplitude: 12 mV
Lead Channel Sensing Intrinsic Amplitude: 3.1 mV
Lead Channel Setting Pacing Amplitude: 2.5 V
Lead Channel Setting Pacing Amplitude: 2.5 V
Lead Channel Setting Pacing Pulse Width: 0.5 ms
Lead Channel Setting Sensing Sensitivity: 2 mV
Pulse Gen Model: 2210
Pulse Gen Serial Number: 7364115

## 2021-05-20 ENCOUNTER — Ambulatory Visit: Payer: Medicare Other

## 2021-05-30 ENCOUNTER — Ambulatory Visit (INDEPENDENT_AMBULATORY_CARE_PROVIDER_SITE_OTHER): Payer: Medicare Other

## 2021-05-30 DIAGNOSIS — I441 Atrioventricular block, second degree: Secondary | ICD-10-CM

## 2021-05-31 LAB — CUP PACEART REMOTE DEVICE CHECK
Battery Remaining Longevity: 7 mo
Battery Remaining Percentage: 6 %
Battery Voltage: 2.74 V
Brady Statistic AP VP Percent: 1 %
Brady Statistic AP VS Percent: 33 %
Brady Statistic AS VP Percent: 1 %
Brady Statistic AS VS Percent: 64 %
Brady Statistic RA Percent Paced: 30 %
Brady Statistic RV Percent Paced: 1 %
Date Time Interrogation Session: 20221129043712
Implantable Lead Implant Date: 19930223
Implantable Lead Implant Date: 19930223
Implantable Lead Location: 753859
Implantable Lead Location: 753860
Implantable Pulse Generator Implant Date: 20130725
Lead Channel Impedance Value: 460 Ohm
Lead Channel Impedance Value: 480 Ohm
Lead Channel Pacing Threshold Amplitude: 1.25 V
Lead Channel Pacing Threshold Amplitude: 1.25 V
Lead Channel Pacing Threshold Pulse Width: 0.5 ms
Lead Channel Pacing Threshold Pulse Width: 1 ms
Lead Channel Sensing Intrinsic Amplitude: 12 mV
Lead Channel Sensing Intrinsic Amplitude: 4 mV
Lead Channel Setting Pacing Amplitude: 2.5 V
Lead Channel Setting Pacing Amplitude: 2.5 V
Lead Channel Setting Pacing Pulse Width: 0.5 ms
Lead Channel Setting Sensing Sensitivity: 2 mV
Pulse Gen Model: 2210
Pulse Gen Serial Number: 7364115

## 2021-06-03 ENCOUNTER — Ambulatory Visit (INDEPENDENT_AMBULATORY_CARE_PROVIDER_SITE_OTHER): Payer: Medicare Other

## 2021-06-03 ENCOUNTER — Other Ambulatory Visit: Payer: Self-pay

## 2021-06-03 DIAGNOSIS — Z7901 Long term (current) use of anticoagulants: Secondary | ICD-10-CM

## 2021-06-03 DIAGNOSIS — Z23 Encounter for immunization: Secondary | ICD-10-CM | POA: Diagnosis not present

## 2021-06-03 LAB — POCT INR: INR: 2.9 (ref 2.0–3.0)

## 2021-06-03 NOTE — Progress Notes (Addendum)
Continue weekly dose to take 2.5mg  daily except take 5 mg on Mon, Wed, and Fri.  Recheck in 5 weeks.

## 2021-06-03 NOTE — Patient Instructions (Addendum)
Pre visit review using our clinic review tool, if applicable. No additional management support is needed unless otherwise documented below in the visit note.  Continue weekly dose to take 2.5mg  daily except take 5 mg on Mon, Wed, and Fri.  Recheck in 5 weeks.

## 2021-06-07 NOTE — Addendum Note (Signed)
Addended by: Elease Etienne A on: 06/07/2021 09:29 AM   Modules accepted: Level of Service

## 2021-06-07 NOTE — Progress Notes (Signed)
Remote pacemaker transmission.   

## 2021-06-08 ENCOUNTER — Ambulatory Visit: Payer: Medicare Other | Admitting: Internal Medicine

## 2021-06-30 ENCOUNTER — Ambulatory Visit (INDEPENDENT_AMBULATORY_CARE_PROVIDER_SITE_OTHER): Payer: Medicare Other

## 2021-06-30 DIAGNOSIS — I441 Atrioventricular block, second degree: Secondary | ICD-10-CM

## 2021-07-01 LAB — CUP PACEART REMOTE DEVICE CHECK
Battery Remaining Longevity: 6 mo
Battery Remaining Percentage: 5 %
Battery Voltage: 2.72 V
Brady Statistic AP VP Percent: 1 %
Brady Statistic AP VS Percent: 29 %
Brady Statistic AS VP Percent: 1 %
Brady Statistic AS VS Percent: 69 %
Brady Statistic RA Percent Paced: 26 %
Brady Statistic RV Percent Paced: 1 %
Date Time Interrogation Session: 20221230044628
Implantable Lead Implant Date: 19930223
Implantable Lead Implant Date: 19930223
Implantable Lead Location: 753859
Implantable Lead Location: 753860
Implantable Pulse Generator Implant Date: 20130725
Lead Channel Impedance Value: 450 Ohm
Lead Channel Impedance Value: 450 Ohm
Lead Channel Pacing Threshold Amplitude: 1.25 V
Lead Channel Pacing Threshold Amplitude: 1.25 V
Lead Channel Pacing Threshold Pulse Width: 0.5 ms
Lead Channel Pacing Threshold Pulse Width: 1 ms
Lead Channel Sensing Intrinsic Amplitude: 4.2 mV
Lead Channel Sensing Intrinsic Amplitude: 9.5 mV
Lead Channel Setting Pacing Amplitude: 2.5 V
Lead Channel Setting Pacing Amplitude: 2.5 V
Lead Channel Setting Pacing Pulse Width: 0.5 ms
Lead Channel Setting Sensing Sensitivity: 2 mV
Pulse Gen Model: 2210
Pulse Gen Serial Number: 7364115

## 2021-07-04 ENCOUNTER — Other Ambulatory Visit: Payer: Self-pay | Admitting: Internal Medicine

## 2021-07-08 ENCOUNTER — Ambulatory Visit (INDEPENDENT_AMBULATORY_CARE_PROVIDER_SITE_OTHER): Payer: Medicare Other

## 2021-07-08 ENCOUNTER — Other Ambulatory Visit: Payer: Self-pay

## 2021-07-08 DIAGNOSIS — Z7901 Long term (current) use of anticoagulants: Secondary | ICD-10-CM

## 2021-07-08 LAB — POCT INR: INR: 2.3 (ref 2.0–3.0)

## 2021-07-08 NOTE — Progress Notes (Signed)
Increase dose today to take 7.5 mg and then continue weekly dose to take 2.5mg  daily except take 5 mg on Mon, Wed, and Fri.  Recheck in 6 weeks per pt request.

## 2021-07-08 NOTE — Patient Instructions (Addendum)
Pre visit review using our clinic review tool, if applicable. No additional management support is needed unless otherwise documented below in the visit note.  Increase dose today to take 7.5 mg and then continue weekly dose to take 2.5mg  daily except take 5 mg on Mon, Wed, and Fri.   Recheck in 6 weeks

## 2021-07-12 ENCOUNTER — Encounter: Payer: Self-pay | Admitting: Internal Medicine

## 2021-07-12 ENCOUNTER — Ambulatory Visit (INDEPENDENT_AMBULATORY_CARE_PROVIDER_SITE_OTHER): Payer: Medicare Other | Admitting: Internal Medicine

## 2021-07-12 ENCOUNTER — Other Ambulatory Visit: Payer: Self-pay

## 2021-07-12 ENCOUNTER — Encounter: Payer: Self-pay | Admitting: Cardiology

## 2021-07-12 ENCOUNTER — Other Ambulatory Visit: Payer: Self-pay | Admitting: Cardiology

## 2021-07-12 VITALS — BP 130/74 | HR 56 | Ht 71.0 in | Wt 184.0 lb

## 2021-07-12 DIAGNOSIS — Z950 Presence of cardiac pacemaker: Secondary | ICD-10-CM

## 2021-07-12 DIAGNOSIS — T829XXA Unspecified complication of cardiac and vascular prosthetic device, implant and graft, initial encounter: Secondary | ICD-10-CM | POA: Diagnosis not present

## 2021-07-12 DIAGNOSIS — I340 Nonrheumatic mitral (valve) insufficiency: Secondary | ICD-10-CM

## 2021-07-12 DIAGNOSIS — I080 Rheumatic disorders of both mitral and aortic valves: Secondary | ICD-10-CM

## 2021-07-12 DIAGNOSIS — Z952 Presence of prosthetic heart valve: Secondary | ICD-10-CM

## 2021-07-12 NOTE — Progress Notes (Signed)
Remote pacemaker transmission.   

## 2021-07-12 NOTE — Patient Instructions (Signed)
Medication Instructions:  Your physician recommends that you continue on your current medications as directed. Please refer to the Current Medication list given to you today.  *If you need a refill on your cardiac medications before your next appointment, please call your pharmacy*   Lab Work: TODAY: BNP, BMP ON Aug 05, 2021: PT/INR, CBC, BMET If you have labs (blood work) drawn today and your tests are completely normal, you will receive your results only by: MyChart Message (if you have MyChart) OR A paper copy in the mail If you have any lab test that is abnormal or we need to change your treatment, we will call you to review the results.   Testing/Procedures: Your physician has requested that you have a TAVR CT on Feb. 6, 2023.   Your physician has requested that you have a TEE on Aug 08, 2021. During a TEE, sound waves are used to create images of your heart. It provides your doctor with information about the size and shape of your heart and how well your hearts chambers and valves are working. In this test, a transducer is attached to the end of a flexible tube thats guided down your throat and into your esophagus (the tube leading from you mouth to your stomach) to get a more detailed image of your heart. You are not awake for the procedure. Please see the instruction sheet given to you today. For further information please visit [email or web address]     Follow-Up: At [facility], you and your health needs are our priority.  As part of our continuing mission to provide you with exceptional heart care, we have created designated Provider Care Teams.  These Care Teams include your primary Cardiologist (physician) and Advanced Practice Providers (APPs -  Physician Assistants and Nurse Practitioners) who all work together to provide you with the care you need, when you need it.  We recommend signing up for the patient portal called "MyChart".  Sign up information is provided on this  After Visit Summary.  MyChart is used to connect with patients for Virtual Visits (Telemedicine).  Patients are able to view lab/test results, encounter notes, upcoming appointments, etc.  Non-urgent messages can be sent to your provider as well.   To learn more about what you can do with MyChart, go to [email or web address].    Your next appointment:   2-3 month(s)  The format for your next appointment:   In Person  Provider:   Christell Constant, MD

## 2021-07-12 NOTE — Progress Notes (Signed)
Cardiology Office Note:    Date:  07/12/2021   ID:  Noah Noah, DOB 02/11/1941, MRN [id number]  PCP:  Ria Bush, MD   [facility] Providers Cardiologist:  Werner Lean, MD     Referring MD: Noah Grayer, MD   CC: prosthetic valve dysfunction   History of Present Illness:    Noah Noah is a 81 y.o. 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 Jude PPM.   Known LBBB.  Seen 07/12/21.  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   Has SOB back in the 90s.  At that time had dizziness.  No dizziness pushing cars now. There are no interval hospital/ED visit.    No chest pain or pressure.  No SOB/DOE and no PND/Orthopnea.  No weight gain or leg swelling  No palpitations or syncope.  Has not had issues with his INR, (2.5 with no plans for aspirin).    Notes that years ago took some aleve and had GI bleed.  No issues in years.      Past Medical History:  Diagnosis Date   CAP (community acquired pneumonia) 07/25/2017   Cataract    left eye   CHF (congestive heart failure) ([place])    Complete heart block ([place])    a. 1993 s/p PPM 2/2 perioperative heart block;  b. 01/2012 s/p SJM Accent DC PPM upgrade.   Depression    GERD (gastroesophageal reflux disease)    History of nephritis    a. as child.   History of pneumonia    Hyperlipidemia    Hypertension    NICM (nonischemic cardiomyopathy) ([place])    a. 06/2012 Echo: EF 45-50%;  b. 08/2013 Echo: EF 20-25%;  c. 10/2013 Adenosine MV: Fixed septal defect likely representing LBBB-related artifact, no reversible ischemia, EF 43%, low risk.   Paroxysmal atrial tachycardia ([place])    a. 07/2013 noted on device interrogation.   Presence of permanent cardiac pacemaker 2013   RA (rheumatoid arthritis) ([place])    S/P aortic valve replacement    a. 1993 s/p SJM mechanical AVR  for aortic stenosis - chronic coumadin.   Shortness of breath dyspnea     Past Surgical History:  Procedure Laterality Date   ANAL FISSURE REPAIR  1970s   AORTIC VALVE REPLACEMENT  1993   St. Jude Mechanical Valve   CATARACT EXTRACTION, BILATERAL Bilateral 02/2020   1 wk apart   INSERT / REPLACE / REMOVE PACEMAKER     PACEMAKER INSERTION  1993   for AV block, not pacemaker dependant   PERMANENT PACEMAKER GENERATOR CHANGE N/A 01/25/2012   Procedure: PERMANENT PACEMAKER GENERATOR CHANGE;  Surgeon: Noah Grayer, MD;  Location: [facility] CATH LAB;  Service: Cardiovascular;  Laterality: N/A;    Current Medications: Current Meds  Medication Sig   alum & mag hydroxide-simeth (MAALOX/MYLANTA) 200-200-20 MG/5ML suspension Take 15 mLs by mouth every 6 (six) hours as needed for indigestion or heartburn.   loperamide (IMODIUM) 2 MG capsule Take 1 capsule (2 mg total) by mouth 4 (four) times daily as needed for diarrhea or loose stools.   loratadine (CLARITIN) 10 MG tablet Take 1 tablet (10 mg total) by mouth daily as needed for allergies.   metoprolol succinate (TOPROL-XL) 50 MG 24 hr tablet Take 1 tablet by mouth once daily   Multiple  Vitamin (MULTI-VITAMIN DAILY PO) Take by mouth daily. Immune System   omeprazole (PRILOSEC) 40 MG capsule Take 1 capsule (40 mg total) by mouth daily. Take daily for 3 days then as needed   sacubitril-valsartan (ENTRESTO) 49-51 MG Take 1 tablet by mouth 2 (two) times daily.   warfarin (COUMADIN) 5 MG tablet TAKE 1/2 TABLET BY MOUTH DAILY EXCEPT TAKE 1 TABLET ON MONDAYS, WEDNESDAYS AND FRIDAYS OR AS DIRECTED BY ANTICOAGULATION CLINIC     Allergies:   Coreg [carvedilol], Lisinopril, and Tessalon [benzonatate]   Social History   Socioeconomic History   Marital status: Married    Spouse name: Not on file   Number of children: 1   Years of education: Not on file   Highest education level: Not on file  Occupational History   Occupation: retired  Tobacco Use   Smoking  status: Never   Smokeless tobacco: Never  Vaping Use   Vaping Use: Never used  Substance and Sexual Activity   Alcohol use: No   Drug use: No   Sexual activity: Not Currently  Other Topics Concern   Not on file  Social History Narrative   Lives with wife, 5 dogs.  grown children away.   Occupation: retired, was Pension scheme manager   Activity: welding   Diet: low sodium diet, good water, fruits/vegetables daily   Social Determinants of Radio broadcast assistant Strain: Not on file  Food Insecurity: Not on file  Transportation Needs: Not on file  Physical Activity: Not on file  Stress: Not on file  Social Connections: Not on file     Family History: The patient's family history includes Arthritis in his father and mother; Congestive Heart Failure in his father; Heart disease in his father; Hypertension in his father and mother; Prostate cancer (age of onset: 86) in his father. There is no history of Coronary artery disease, Stroke, or Diabetes.  ROS:   Please see the history of present illness.     All other systems reviewed and are negative.  EKGs/Labs/Other Studies Reviewed:    The following studies were reviewed today:   EKG:  EKG is  ordered today.  The ekg ordered today demonstrates  07/12/21. Sinus bradycardia LBBB  Transthoracic Echocardiogram: Date: Results:  1. There is a mechanical aortic valve prosthesis present (St. Jude device unclear size). 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 Vmax 3.9  m/s, MG, 33 mmHG, EOA 0.63 cm2, DI 0.16. The AT is prolonged ~183msec.  There is trivial AI which is likely washing  jets (normal finding). All other findings are suggestive of severe  prosthetic valve stenosis. Recent INRs are within limits. Would recommend  a cardiac CT or TEE for clarification of valve dysfunction. The aortic  valve has been repaired/replaced. Aortic  valve regurgitation is trivial. Severe aortic valve stenosis.   2. Left ventricular ejection  fraction, by estimation, is 30 to 35%. The  left ventricle has moderately decreased function. The left ventricle  demonstrates global hypokinesis. Left ventricular diastolic parameters are  consistent with Grade II diastolic  dysfunction (pseudonormalization). Elevated left ventricular end-diastolic  pressure. The E/e' is 25.3.   3. Right ventricular systolic function is mildly reduced. The right  ventricular size is moderately enlarged. There is normal pulmonary artery  systolic pressure.   4. The mitral valve is grossly normal. Moderate to severe mitral valve  regurgitation. No evidence of mitral stenosis.   5. Aneurysm of the ascending aorta, measuring 47 mm.   6. The inferior vena  cava is normal in size with greater than 50%  respiratory variability, suggesting right atrial pressure of 3 mmHg.    Recent Labs: 04/28/2021: BUN 19; Creatinine, Ser 1.17; Potassium 4.4; Sodium 145  Recent Lipid Panel    Component Value Date/Time   CHOL 222 (H) 05/24/2020 1201   TRIG 174.0 (H) 05/24/2020 1201   HDL 48.70 05/24/2020 1201   CHOLHDL 5 05/24/2020 1201   VLDL 34.8 05/24/2020 1201   LDLCALC 138 (H) 05/24/2020 1201   LDLDIRECT 157.0 02/16/2016 1248    Physical Exam:    VS:  BP 130/74    Pulse (!) 56    Ht 5\' 11"  (1.803 m)    Wt 83.5 kg    SpO2 98%    BMI 25.66 kg/m     Wt Readings from Last 3 Encounters:  07/12/21 83.5 kg  05/13/21 82.6 kg  04/05/21 82 kg     Gen: No distress  Neck: No JVD  Cardiac: No Rubs or Gallops, holosystolic Murmur with prominent crescendo, regular rhythm with +2 radial pulses Respiratory: Clear to auscultation bilaterally, normal effort, normal  respiratory rate GI: Soft, nontender, non-distended  MS: No  edema;  moves all extremities Integument: Skin feels warm Neuro:  At time of evaluation, alert and oriented to person/place/time/situation  Psych: Normal affect, patient feels well   ASSESSMENT:    1. Moderate to severe mitral regurgitation   2.  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 DC PPM - though he is asymptomatic evidence of prosthetic valve obstruction  (pannus > thrombus given therapeutic INR hx) - will do Cardiac CT - I have scheduled him for TEE 08/08/21 with me for eval of MR and AS - BMP and BNP today - may do Aldactone trial based on results (he notes prior issues with coreg and lisinopril, we discussed pros and cons of medication in detail) - patient notes that Dr. Lovena Le will be taking over for him and that there have been discussion of CRT; based on valve findings this is reasonable - he is presently asymptomatic but with worsened EF from prior - 2/3 months with me  CC Dr. Lovena Le  Time Spent Directly with Patient:   I have spent a total of 40 minutes with the patient reviewing notes, imaging, EKGs, labs and examining the patient as well as establishing an assessment and plan that was discussed personally with the patient.  > 50% of time was spent in direct patient care and wife.        Medication Adjustments/Labs and Tests Ordered: Current medicines are reviewed at length with the patient today.  Concerns regarding medicines are outlined above.  Orders Placed This Encounter  Procedures   Protime-INR   CBC   Basic metabolic panel   Basic metabolic panel   Pro b natriuretic peptide (BNP)   EKG 12-Lead   No orders of the defined types were placed in this encounter.   Patient Instructions  Medication Instructions:  Your physician recommends that you continue on your current medications as directed. Please refer to the Current Medication list given to you today.  *If you need a refill on your cardiac medications before your next appointment, please call your pharmacy*   Lab Work: TODAY: BNP, BMP ON Aug 05, 2021: PT/INR, CBC, BMET If you have labs (blood work) drawn today and your tests are  completely normal, you will receive your results only by: MyChart Message (if you have MyChart) OR A paper copy in the mail If you have any lab test that is abnormal or we need to change your treatment, we will call you to review the results.   Testing/Procedures: Your physician has requested that you have a TAVR CT on Feb. 6, 2023.   Your physician has requested that you have a TEE on Aug 08, 2021. During a TEE, sound waves are used to create images of your heart. It provides your doctor with information about the size and shape of your heart and how well your hearts chambers and valves are working. In this test, a transducer is attached to the end of a flexible tube thats guided down your throat and into your esophagus (the tube leading from you mouth to your stomach) to get a more detailed image of your heart. You are not awake for the procedure. Please see the instruction sheet given to you today. For further information please visit [email or web address].     Follow-Up: At [facility], you and your health needs are our priority.  As part of our continuing mission to provide you with exceptional heart care, we have created designated Provider Care Teams.  These Care Teams include your primary Cardiologist (physician) and Advanced Practice Providers (APPs -  Physician Assistants and Nurse Practitioners) who all work together to provide you with the care you need, when you need it.  We recommend signing up for the patient portal called "MyChart".  Sign up information is provided on this After Visit Summary.  MyChart is used to connect with patients for Virtual Visits (Telemedicine).  Patients are able to view lab/test results, encounter notes, upcoming appointments, etc.  Non-urgent messages can be sent to your provider as well.   To learn more about what you can do with MyChart, go to [email or web address].    Your next appointment:   2-3 month(s)  The format for your next appointment:    In Person  Provider:   Werner Lean, MD       Signed, Werner Lean, MD  07/12/2021 3:47 PM    [place]

## 2021-07-12 NOTE — H&P (View-Only) (Signed)
Cardiology Office Note:    Date:  07/12/2021   ID:  Noah Thompson, DOB Jun 07, 1941, MRN [id number]  PCP:  Ria Bush, MD   [facility] Providers Cardiologist:  Werner Lean, MD     Referring MD: Thompson Grayer, MD   CC: prosthetic valve dysfunction   History of Present Illness:    Noah Thompson is a 81 y.o. 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 Jude PPM.   Known LBBB.  Seen 07/12/21.  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   Has SOB back in the 90s.  At that time had dizziness.  No dizziness pushing cars now. There are no interval hospital/ED visit.    No chest pain or pressure.  No SOB/DOE and no PND/Orthopnea.  No weight gain or leg swelling  No palpitations or syncope.  Has not had issues with his INR, (2.5 with no plans for aspirin).    Notes that years ago took some aleve and had GI bleed.  No issues in years.      Past Medical History:  Diagnosis Date   CAP (community acquired pneumonia) 07/25/2017   Cataract    left eye   CHF (congestive heart failure) ([employer])    Complete heart block ([place])    a. 1993 s/p PPM 2/2 perioperative heart block;  b. 01/2012 s/p SJM Accent DC PPM upgrade.   Depression    GERD (gastroesophageal reflux disease)    History of nephritis    a. as child.   History of pneumonia    Hyperlipidemia    Hypertension    NICM (nonischemic cardiomyopathy) ([place])    a. 06/2012 Echo: EF 45-50%;  b. 08/2013 Echo: EF 20-25%;  c. 10/2013 Adenosine MV: Fixed septal defect likely representing LBBB-related artifact, no reversible ischemia, EF 43%, low risk.   Paroxysmal atrial tachycardia ([place])    a. 07/2013 noted on device interrogation.   Presence of permanent cardiac pacemaker 2013   RA (rheumatoid arthritis) ([place])    S/P aortic valve replacement    a. 1993 s/p SJM mechanical AVR  for aortic stenosis - chronic coumadin.   Shortness of breath dyspnea     Past Surgical History:  Procedure Laterality Date   ANAL FISSURE REPAIR  1970s   AORTIC VALVE REPLACEMENT  1993   St. Jude Mechanical Valve   CATARACT EXTRACTION, BILATERAL Bilateral 02/2020   1 wk apart   INSERT / REPLACE / REMOVE PACEMAKER     PACEMAKER INSERTION  1993   for AV block, not pacemaker dependant   PERMANENT PACEMAKER GENERATOR CHANGE N/A 01/25/2012   Procedure: PERMANENT PACEMAKER GENERATOR CHANGE;  Surgeon: Thompson Grayer, MD;  Location: [facility] CATH LAB;  Service: Cardiovascular;  Laterality: N/A;    Current Medications: Current Meds  Medication Sig   alum & mag hydroxide-simeth (MAALOX/MYLANTA) 200-200-20 MG/5ML suspension Take 15 mLs by mouth every 6 (six) hours as needed for indigestion or heartburn.   loperamide (IMODIUM) 2 MG capsule Take 1 capsule (2 mg total) by mouth 4 (four) times daily as needed for diarrhea or loose stools.   loratadine (CLARITIN) 10 MG tablet Take 1 tablet (10 mg total) by mouth daily as needed for allergies.   metoprolol succinate (TOPROL-XL) 50 MG 24 hr tablet Take 1 tablet by mouth once daily   Multiple  Vitamin (MULTI-VITAMIN DAILY PO) Take by mouth daily. Immune System   omeprazole (PRILOSEC) 40 MG capsule Take 1 capsule (40 mg total) by mouth daily. Take daily for 3 days then as needed   sacubitril-valsartan (ENTRESTO) 49-51 MG Take 1 tablet by mouth 2 (two) times daily.   warfarin (COUMADIN) 5 MG tablet TAKE 1/2 TABLET BY MOUTH DAILY EXCEPT TAKE 1 TABLET ON MONDAYS, WEDNESDAYS AND FRIDAYS OR AS DIRECTED BY ANTICOAGULATION CLINIC     Allergies:   Coreg [carvedilol], Lisinopril, and Tessalon [benzonatate]   Social History   Socioeconomic History   Marital status: Married    Spouse name: Not on file   Number of children: 1   Years of education: Not on file   Highest education level: Not on file  Occupational History   Occupation: retired  Tobacco Use   Smoking  status: Never   Smokeless tobacco: Never  Vaping Use   Vaping Use: Never used  Substance and Sexual Activity   Alcohol use: No   Drug use: No   Sexual activity: Not Currently  Other Topics Concern   Not on file  Social History Narrative   Lives with wife, 5 dogs.  grown children away.   Occupation: retired, was Pension scheme manager   Activity: welding   Diet: low sodium diet, good water, fruits/vegetables daily   Social Determinants of Radio broadcast assistant Strain: Not on file  Food Insecurity: Not on file  Transportation Needs: Not on file  Physical Activity: Not on file  Stress: Not on file  Social Connections: Not on file     Family History: The patient's family history includes Arthritis in his father and mother; Congestive Heart Failure in his father; Heart disease in his father; Hypertension in his father and mother; Prostate cancer (age of onset: 42) in his father. There is no history of Coronary artery disease, Stroke, or Diabetes.  ROS:   Please see the history of present illness.     All other systems reviewed and are negative.  EKGs/Labs/Other Studies Reviewed:    The following studies were reviewed today:   EKG:  EKG is  ordered today.  The ekg ordered today demonstrates  07/12/21. Sinus bradycardia LBBB  Transthoracic Echocardiogram: Date: Results:  1. There is a mechanical aortic valve prosthesis present (St. Jude device unclear size). 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 Vmax 3.9  m/s, MG, 33 mmHG, EOA 0.63 cm2, DI 0.16. The AT is prolonged ~138msec.  There is trivial AI which is likely washing  jets (normal finding). All other findings are suggestive of severe  prosthetic valve stenosis. Recent INRs are within limits. Would recommend  a cardiac CT or TEE for clarification of valve dysfunction. The aortic  valve has been repaired/replaced. Aortic  valve regurgitation is trivial. Severe aortic valve stenosis.   2. Left ventricular ejection  fraction, by estimation, is 30 to 35%. The  left ventricle has moderately decreased function. The left ventricle  demonstrates global hypokinesis. Left ventricular diastolic parameters are  consistent with Grade II diastolic  dysfunction (pseudonormalization). Elevated left ventricular end-diastolic  pressure. The E/e' is 25.3.   3. Right ventricular systolic function is mildly reduced. The right  ventricular size is moderately enlarged. There is normal pulmonary artery  systolic pressure.   4. The mitral valve is grossly normal. Moderate to severe mitral valve  regurgitation. No evidence of mitral stenosis.   5. Aneurysm of the ascending aorta, measuring 47 mm.   6. The inferior vena  cava is normal in size with greater than 50%  respiratory variability, suggesting right atrial pressure of 3 mmHg.    Recent Labs: 04/28/2021: BUN 19; Creatinine, Ser 1.17; Potassium 4.4; Sodium 145  Recent Lipid Panel    Component Value Date/Time   CHOL 222 (H) 05/24/2020 1201   TRIG 174.0 (H) 05/24/2020 1201   HDL 48.70 05/24/2020 1201   CHOLHDL 5 05/24/2020 1201   VLDL 34.8 05/24/2020 1201   LDLCALC 138 (H) 05/24/2020 1201   LDLDIRECT 157.0 02/16/2016 1248    Physical Exam:    VS:  BP 130/74    Pulse (!) 56    Ht 5\' 11"  (1.803 m)    Wt 83.5 kg    SpO2 98%    BMI 25.66 kg/m     Wt Readings from Last 3 Encounters:  07/12/21 83.5 kg  05/13/21 82.6 kg  04/05/21 82 kg     Gen: No distress  Neck: No JVD  Cardiac: No Rubs or Gallops, holosystolic Murmur with prominent crescendo, regular rhythm with +2 radial pulses Respiratory: Clear to auscultation bilaterally, normal effort, normal  respiratory rate GI: Soft, nontender, non-distended  MS: No  edema;  moves all extremities Integument: Skin feels warm Neuro:  At time of evaluation, alert and oriented to person/place/time/situation  Psych: Normal affect, patient feels well   ASSESSMENT:    1. Moderate to severe mitral regurgitation   2.  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 DC PPM - though he is asymptomatic evidence of prosthetic valve obstruction  (pannus > thrombus given therapeutic INR hx) - will do Cardiac CT - I have scheduled him for TEE 08/08/21 with me for eval of MR and AS - BMP and BNP today - may do Aldactone trial based on results (he notes prior issues with coreg and lisinopril, we discussed pros and cons of medication in detail) - patient notes that Dr. Lovena Le will be taking over for him and that there have been discussion of CRT; based on valve findings this is reasonable - he is presently asymptomatic but with worsened EF from prior - 2/3 months with me  CC Dr. Lovena Le  Time Spent Directly with Patient:   I have spent a total of 40 minutes with the patient reviewing notes, imaging, EKGs, labs and examining the patient as well as establishing an assessment and plan that was discussed personally with the patient.  > 50% of time was spent in direct patient care and wife.        Medication Adjustments/Labs and Tests Ordered: Current medicines are reviewed at length with the patient today.  Concerns regarding medicines are outlined above.  Orders Placed This Encounter  Procedures   Protime-INR   CBC   Basic metabolic panel   Basic metabolic panel   Pro b natriuretic peptide (BNP)   EKG 12-Lead   No orders of the defined types were placed in this encounter.   Patient Instructions  Medication Instructions:  Your physician recommends that you continue on your current medications as directed. Please refer to the Current Medication list given to you today.  *If you need a refill on your cardiac medications before your next appointment, please call your pharmacy*   Lab Work: TODAY: BNP, BMP ON Aug 05, 2021: PT/INR, CBC, BMET If you have labs (blood work) drawn today and your tests are  completely normal, you will receive your results only by: MyChart Message (if you have MyChart) OR A paper copy in the mail If you have any lab test that is abnormal or we need to change your treatment, we will call you to review the results.   Testing/Procedures: Your physician has requested that you have a TAVR CT on Feb. 6, 2023.   Your physician has requested that you have a TEE on Aug 08, 2021. During a TEE, sound waves are used to create images of your heart. It provides your doctor with information about the size and shape of your heart and how well your hearts chambers and valves are working. In this test, a transducer is attached to the end of a flexible tube thats guided down your throat and into your esophagus (the tube leading from you mouth to your stomach) to get a more detailed image of your heart. You are not awake for the procedure. Please see the instruction sheet given to you today. For further information please visit [email or web address].     Follow-Up: At [facility], you and your health needs are our priority.  As part of our continuing mission to provide you with exceptional heart care, we have created designated Provider Care Teams.  These Care Teams include your primary Cardiologist (physician) and Advanced Practice Providers (APPs -  Physician Assistants and Nurse Practitioners) who all work together to provide you with the care you need, when you need it.  We recommend signing up for the patient portal called "MyChart".  Sign up information is provided on this After Visit Summary.  MyChart is used to connect with patients for Virtual Visits (Telemedicine).  Patients are able to view lab/test results, encounter notes, upcoming appointments, etc.  Non-urgent messages can be sent to your provider as well.   To learn more about what you can do with MyChart, go to [email or web address].    Your next appointment:   2-3 month(s)  The format for your next appointment:    In Person  Provider:   Werner Lean, MD       Signed, Werner Lean, MD  07/12/2021 3:47 PM    [place]

## 2021-07-12 NOTE — Addendum Note (Signed)
Addended by: Cheri Kearns A on: 07/12/2021 03:04 PM   Modules accepted: Level of Service

## 2021-07-13 LAB — BASIC METABOLIC PANEL
BUN/Creatinine Ratio: 14 (ref 10–24)
BUN: 18 mg/dL (ref 8–27)
CO2: 25 mmol/L (ref 20–29)
Calcium: 9.8 mg/dL (ref 8.6–10.2)
Chloride: 104 mmol/L (ref 96–106)
Creatinine, Ser: 1.28 mg/dL — ABNORMAL HIGH (ref 0.76–1.27)
Glucose: 90 mg/dL (ref 70–99)
Potassium: 4.7 mmol/L (ref 3.5–5.2)
Sodium: 142 mmol/L (ref 134–144)
eGFR: 57 mL/min/{1.73_m2} — ABNORMAL LOW (ref >59)

## 2021-07-13 LAB — PRO B NATRIURETIC PEPTIDE: NT-Pro BNP: 2232 pg/mL — ABNORMAL HIGH (ref 0–486)

## 2021-07-14 ENCOUNTER — Telehealth: Payer: Self-pay | Admitting: Internal Medicine

## 2021-07-14 MED ORDER — ENTRESTO 97-103 MG PO TABS: 1.0000 | ORAL_TABLET | Freq: Two times a day (BID) | ORAL | 11 refills | Status: DC

## 2021-07-14 NOTE — Telephone Encounter (Signed)
Chandrasekhar, Mahesh A, MD  P Cv [facility] Triage Elevated BNP  Increase to high dose Entresto (97/103) with blood pressure monitoring and hold for symptomatic hypotension.  Will plan to do both tests we discussed.    Called patient's wife (DPR) with results. Patient's wife is very nervous about patient increasing Entresto. Informed her that Dr. Debby Bud nurse will call and check on them. Will send message to Larita Fife as well since patient gets medications from [facility]. Patient will double his dose of 49-51 mg until he receives higher dose.

## 2021-07-14 NOTE — Telephone Encounter (Signed)
Patient's spouse returned call for lab results.

## 2021-07-15 MED ORDER — ENTRESTO 97-103 MG PO TABS: 1.0000 | ORAL_TABLET | Freq: Two times a day (BID) | ORAL | 3 refills | Status: DC

## 2021-07-15 MED ORDER — ENTRESTO 97-103 MG PO TABS: 1.0000 | ORAL_TABLET | Freq: Two times a day (BID) | ORAL | 11 refills | Status: DC

## 2021-07-15 NOTE — Addendum Note (Signed)
**Note De-Identified Kenton Fortin Obfuscation** Addended by: Demetrios Loll on: 07/15/2021 10:20 AM   Modules accepted: Orders

## 2021-07-15 NOTE — Telephone Encounter (Deleted)
**Note De-Identified Will Schier Obfuscation** Just to be sure that nothing has changed with the process of increasing a [employer] with [employer], I called them and s/w Latoya who advised me that we still e-scribe the new RX to [facility] in [place], [place] with a note to the pharmacist "Increase in dose" and they will fill and ship to the pt.  I have e-scribed Entresto 97-103 mg #180 with

## 2021-07-15 NOTE — Telephone Encounter (Signed)
**Note De-Identified Jeromy Borcherding Obfuscation** Just to be sure that nothing has changed with the process of increasing a [employer] with [facility], I called them and s/w Latoya who advised me that we still e-scribe the new RX to [facility] in [place], [place] with a note to the pharmacist "Increase in dose" and they will fill and ship to the pt.   I have e-scribed Entresto 97-103 mg #180 with 3 refills to [facility] pharmacy to fill.

## 2021-07-18 ENCOUNTER — Telehealth: Payer: Self-pay | Admitting: Internal Medicine

## 2021-07-18 NOTE — Telephone Encounter (Signed)
Patient's wife calling to follow up.

## 2021-07-18 NOTE — Telephone Encounter (Signed)
Patient 's wife calling to see if the patient could have an echo prior to the CT in February.

## 2021-07-18 NOTE — Telephone Encounter (Signed)
Called Patient and Family (wife) in regard to cardiac imaging questions Discussed that they had a sub-optimal encounter with the last cardiologist  Answered questions in regard to Echo shadowing (despite having elevated prosthetic gradients). This study will both decided if and/or by what method AV interventions would be pursued.  Discussed that we should get the cardiac CT; while I also feel we should get the TEE, if patient wished to defer we can do it based on the CT results.  Presently, patient and wife amenable to getting imaging done.  Patient had no further questions.  Riley Lam, MD Cardiologist [facility]  [address] [place], [address] [place], [place] [address] [phone]  5:15 PM

## 2021-07-18 NOTE — Telephone Encounter (Signed)
Returned call to pt wife ok per dpr.  Wife expresses that pt would like to have an Echo prior to CT.  I advised her that an Echo would be performed with TEE.  She reports pt has high anxiety and would like to have a regular Echo.  She was told by someone- medical personnel (unable to verify who) that echo results could have been shadowing.  Pt would like a regular Echo to see how heart looks.  If a TEE is needed after this pt will have it done later.  I reviewed Echo results from 04/19/22 with wife again and she continues to request a regular TEE.  Will route to MD to address.

## 2021-07-19 NOTE — Telephone Encounter (Signed)
**Note De-Identified Noah Thompson Obfuscation** No answer so I left a message for [name], the pts wife and DPR, on their VM advising that I did e-scribe the pts Entresto 97-103 mg #180 with 3 refills to [facility] (pharmacy for [employer] [facility] pt asst [facility]) on 07/15/21. Per the pts chart they did receive it as follows:  Receipt confirmed by pharmacy (07/15/2021 10:33 AM EST) [facility] [place], [place] - [address]  I did advise in the message that they can call [employer] at [phone] to check on his Entresto refill status.

## 2021-07-19 NOTE — Progress Notes (Deleted)
Cardiology Office Note Date:  07/19/2021  Patient ID:  Noah Thompson 1940/11/28, MRN [id number] PCP:  Ria Bush, MD  Electrophysiologist: Dr. Rayann Heman Cardiologist: Dr. Gasper Sells     Chief Complaint: *** 22mo follow up  History of Present Illness: Noah Thompson is a 81 y.o. male with history of HTN, HLD, chronic CHF (systoli), NICM, Atach, RA, VHD s/p AVR (mechanical, 1993), CHB w/PPM  He comes intoday to be seen for Dr. Rayann Heman, last seen by him June 2022, at that time doing well, VP only 2%, revisited upgrade to CRT device with LVEF 40%, though the patient not interested.   Losartan was changed to [facility] with plans to see APP in 34mo for medicine optimization.  I saw him 04/05/21 He is with his wife today. He feels very well, says he thinks he feels like he has better energy, and overall sense of wellbeing is improved. No CP, palpitations or cardiac awareness. He does not formally exercise but is active and stays busy/moving with chores around the house and so forth, denies SOB or DOE No dizzy spells, near syncope or syncope.  His PMD manages his lipids, labs, INR No bleeding or signs of bleeding Updated echo planned  TTE noted severe prosthetic AV stenosis and reduced LVEF 30-35% Result was discussed with the patient recommended proceeding with TEE, He saw Dr. Rayann Heman 05/13/21 to further discuss, he recommended R/LHC and TEE, he was minimally if at all symptomatic and despite recommendations, the patient declined not wanting to pursue further, mentioned he would die before he would have another valve surgery. Planned to refer to Dr. Gasper Sells for ongoing cardiac management if his CM and VHD.  He saw him 07/12/21, planned for TEE and c.MRI, planned to f/u with Dr. Lovena Le going forward with some discussions perhaps of CRT  ***   Device information Abbott dual chamber PPM implanted 08/26/1991 > last gen change 01/25/2012   Past Medical History:  Diagnosis  Date   CAP (community acquired pneumonia) 07/25/2017   Cataract    left eye   CHF (congestive heart failure) ([place])    Complete heart block ([place])    a. 1993 s/p PPM 2/2 perioperative heart block;  b. 01/2012 s/p SJM Accent DC PPM upgrade.   Depression    GERD (gastroesophageal reflux disease)    History of nephritis    a. as child.   History of pneumonia    Hyperlipidemia    Hypertension    NICM (nonischemic cardiomyopathy) ([place])    a. 06/2012 Echo: EF 45-50%;  b. 08/2013 Echo: EF 20-25%;  c. 10/2013 Adenosine MV: Fixed septal defect likely representing LBBB-related artifact, no reversible ischemia, EF 43%, low risk.   Paroxysmal atrial tachycardia ([place])    a. 07/2013 noted on device interrogation.   Presence of permanent cardiac pacemaker 2013   RA (rheumatoid arthritis) ([place])    S/P aortic valve replacement    a. 1993 s/p SJM mechanical AVR for aortic stenosis - chronic coumadin.   Shortness of breath dyspnea     Past Surgical History:  Procedure Laterality Date   ANAL FISSURE REPAIR  1970s   AORTIC VALVE REPLACEMENT  1993   St. Jude Mechanical Valve   CATARACT EXTRACTION, BILATERAL Bilateral 02/2020   1 wk apart   INSERT / REPLACE / [place]   for AV block, not pacemaker dependant   PERMANENT PACEMAKER GENERATOR CHANGE N/A 01/25/2012   Procedure: PERMANENT PACEMAKER GENERATOR  CHANGE;  Surgeon: Thompson Grayer, MD;  Location: [facility] CATH LAB;  Service: Cardiovascular;  Laterality: N/A;    Current Outpatient Medications  Medication Sig Dispense Refill   alum & mag hydroxide-simeth (MAALOX/MYLANTA) 200-200-20 MG/5ML suspension Take 15 mLs by mouth every 6 (six) hours as needed for indigestion or heartburn.     loperamide (IMODIUM) 2 MG capsule Take 1 capsule (2 mg total) by mouth 4 (four) times daily as needed for diarrhea or loose stools.     loratadine (CLARITIN) 10 MG tablet Take 1 tablet (10 mg total) by mouth daily as needed for allergies.      metoprolol succinate (TOPROL-XL) 50 MG 24 hr tablet Take 1 tablet by mouth once daily 90 tablet 0   Multiple Vitamin (MULTI-VITAMIN DAILY PO) Take by mouth daily. Immune System     omeprazole (PRILOSEC) 40 MG capsule Take 1 capsule (40 mg total) by mouth daily. Take daily for 3 days then as needed 30 capsule 3   sacubitril-valsartan (ENTRESTO) 97-103 MG Take 1 tablet by mouth 2 (two) times daily. 180 tablet 3   warfarin (COUMADIN) 5 MG tablet TAKE 1/2 TABLET BY MOUTH DAILY EXCEPT TAKE 1 TABLET ON MONDAYS, WEDNESDAYS AND FRIDAYS OR AS DIRECTED BY ANTICOAGULATION CLINIC 110 tablet 0   No current facility-administered medications for this visit.    Allergies:   Coreg [carvedilol], Lisinopril, and Tessalon [benzonatate]   Social History:  The patient  reports that he has never smoked. He has never used smokeless tobacco. He reports that he does not drink alcohol and does not use drugs.   Family History:  The patient's family history includes Arthritis in his father and mother; Congestive Heart Failure in his father; Heart disease in his father; Hypertension in his father and mother; Prostate cancer (age of onset: 79) in his father.  ROS:  Please see the history of present illness.    All other systems are reviewed and otherwise negative.   PHYSICAL EXAM:  VS:  There were no vitals taken for this visit. BMI: There is no height or weight on file to calculate BMI. Well nourished, well developed, in no acute distress HEENT: normocephalic, atraumatic Neck: no JVD, carotid bruits or masses Cardiac:  *** RRR; valve is appreciated, no significant murmurs, no rubs, or gallops Lungs:  *** CTA b/l, no wheezing, rhonchi or rales Abd: soft, nontender MS: no deformity or atrophy Ext:  *** no edema Skin: warm and dry, no rash Neuro:  No gross deficits appreciated Psych: euthymic mood, full affect  *** PPM site is stable, (R side) no tethering or discomfort   EKG:  not done today  Device  interrogation done today and reviewed by myself:  ***  04/19/21 IMPRESSIONS   1. There is a mechanical aortic valve prosthesis present (unclear  surgical details). 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 Vmax 3.9  m/s, MG, 33 mmHG, EOA 0.63 cm2, DI 0.16. The AT is prolonged ~124msec.  There is trivial AI which is likely washing  jets (normal finding). All other findings are suggestive of severe  prosthetic valve stenosis. Recent INRs are within limits. Would recommend  a cardiac CT or TEE for clarification of valve dysfunction. The aortic  valve has been repaired/replaced. Aortic  valve regurgitation is trivial. Severe aortic valve stenosis.   2. Left ventricular ejection fraction, by estimation, is 30 to 35%. The  left ventricle has moderately decreased function. The left ventricle  demonstrates global hypokinesis. Left ventricular diastolic parameters are  consistent with  Grade II diastolic  dysfunction (pseudonormalization). Elevated left ventricular end-diastolic  pressure. The E/e' is 25.3.   3. Right ventricular systolic function is mildly reduced. The right  ventricular size is moderately enlarged. There is normal pulmonary artery  systolic pressure.   4. The mitral valve is grossly normal. Moderate to severe mitral valve  regurgitation. No evidence of mitral stenosis.   5. Aneurysm of the ascending aorta, measuring 47 mm.   6. The inferior vena cava is normal in size with greater than 50%  respiratory variability, suggesting right atrial pressure of 3 mmHg.   Comparison(s): Changes from prior study are noted. EF 30-35%. Concerns for  severe prosthetic valve stenosis.    12/02/2019: TTE IMPRESSIONS   1. Left ventricular ejection fraction, by estimation, is 40 to 45%. The  left ventricle has mildly decreased function. The left ventricle  demonstrates global hypokinesis. Left ventricular diastolic parameters are  consistent with Grade I diastolic  dysfunction (impaired  relaxation).   2. Right ventricular systolic function is normal. The right ventricular  size is normal. There is normal pulmonary artery systolic pressure. The  estimated right ventricular systolic pressure is 99991111 mmHg.   3. Left atrial size was moderately dilated.   4. The mitral valve is normal in structure. Moderate mitral valve  regurgitation. No evidence of mitral stenosis.   5. Tricuspid valve regurgitation is moderate.   6. Prior velocity 3.77m/s, gradient 23 mmHg in 2018. The aortic valve was  not well visualized. Aortic valve regurgitation is not visualized. No  aortic stenosis is present. Aortic valve mean gradient measures 23.0 mmHg.  Aortic valve Vmax measures 3.21  m/s.   7. Aortic dilatation noted. 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   8. The inferior vena cava is normal in size with greater than 50%  respiratory variability, suggesting right atrial pressure of 3 mmHg.   Comparison(s): Prior images reviewed side by side. The left ventricular  function has improved. Prior EF 30-35%.    12/22/2016: TTE Study Conclusions  - Left ventricle: The cavity size was moderately dilated. Wall    thickness was normal. Systolic function was moderately to    severely reduced. The estimated ejection fraction was in the    range of 30% to 35%. Features are consistent with a pseudonormal    left ventricular filling pattern, with concomitant abnormal    relaxation and increased filling pressure (grade 2 diastolic    dysfunction).  - Aortic valve: Severely calcified annulus. Severely calcified    leaflets. There was mild to moderate stenosis. There was trivial    regurgitation.  - Mitral valve: Mildly to moderately calcified annulus. There was    mild to moderate regurgitation. Valve area by pressure half-time:    2.42 cm^2.    Recent Labs: 07/12/2021: BUN 18; Creatinine, Ser 1.28; NT-Pro BNP 2,232; Potassium 4.7; Sodium 142  No results found for  requested labs within last 8760 hours.   Estimated Creatinine Clearance: 49 mL/min (A) (by C-G formula based on SCr of 1.28 mg/dL (H)).   Wt Readings from Last 3 Encounters:  07/12/21 184 lb (83.5 kg)  05/13/21 182 lb 3.2 oz (82.6 kg)  04/05/21 180 lb 12.8 oz (82 kg)     Other studies reviewed: Additional studies/records reviewed today include: summarized above  ASSESSMENT AND PLAN:  PPM ***  NICM No symptoms or exam findings of volume OL *** On Toprol and Entresto ***  Mechanical AVR *** On warfarin,  monitored and managed with his PMD *** Stable function by last echo  HTN ***   Disposition: ***  Current medicines are reviewed at length with the patient today.  The patient did not have any concerns regarding medicines.  Venetia Night, PA-C 07/19/2021 2:17 PM     [facility] [address] [place] [address] [phone] (office)  [phone] (fax)

## 2021-07-20 NOTE — Telephone Encounter (Signed)
Wife calling because she hasnt heard anything and the patient is in need of the medication. Please advise

## 2021-07-21 MED ORDER — ENTRESTO 97-103 MG PO TABS: 1.0000 | ORAL_TABLET | Freq: Two times a day (BID) | ORAL | 3 refills | Status: DC

## 2021-07-21 NOTE — Telephone Encounter (Signed)
**Note De-Identified Puanani Gene Obfuscation** The pts wife Noah Thompson states that [facility] advised her yesterday to call them back if the pt has not received his Entresto 97-103 mg from them by Monday 01/23. His dose was increased to 97-103 mg on 1/13 so he doubled his dose of 49-51 mg BID and is now completely out.  I advised Noah Thompson that we will leave them 1 weeks worth of Entresto 49-51 mg samples in the front office at Dr Debby Bud office at [facility] to pick up (I explained that because our samples are limited and he will need to double up on the 49-51 mg tablet to almost equal the 97-103 mg tablet this is all we can provide at this time).  Noah Thompson thanked me for our help and states that she will continue to work with [facility] on getting the pts correct Entresto dose shipped to him as quickly as possible.  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

## 2021-07-21 NOTE — Addendum Note (Signed)
**Note De-Identified Dealva Lafoy Obfuscation** Addended by: Demetrios Loll on: 07/21/2021 08:47 AM   Modules accepted: Orders

## 2021-07-25 ENCOUNTER — Encounter: Payer: Medicare Other | Admitting: Physician Assistant

## 2021-07-29 ENCOUNTER — Ambulatory Visit (INDEPENDENT_AMBULATORY_CARE_PROVIDER_SITE_OTHER): Payer: Medicare Other

## 2021-07-29 ENCOUNTER — Encounter (HOSPITAL_COMMUNITY): Payer: Self-pay | Admitting: Internal Medicine

## 2021-07-29 DIAGNOSIS — I428 Other cardiomyopathies: Secondary | ICD-10-CM | POA: Diagnosis not present

## 2021-07-29 LAB — CUP PACEART REMOTE DEVICE CHECK
Battery Remaining Longevity: 5 mo
Battery Remaining Percentage: 5 %
Battery Voltage: 2.72 V
Brady Statistic AP VP Percent: 1 %
Brady Statistic AP VS Percent: 29 %
Brady Statistic AS VP Percent: 1 %
Brady Statistic AS VS Percent: 69 %
Brady Statistic RA Percent Paced: 26 %
Brady Statistic RV Percent Paced: 1 %
Date Time Interrogation Session: 20230127111917
Implantable Lead Implant Date: 19930223
Implantable Lead Implant Date: 19930223
Implantable Lead Location: 753859
Implantable Lead Location: 753860
Implantable Pulse Generator Implant Date: 20130725
Lead Channel Impedance Value: 480 Ohm
Lead Channel Impedance Value: 480 Ohm
Lead Channel Pacing Threshold Amplitude: 1.25 V
Lead Channel Pacing Threshold Amplitude: 1.25 V
Lead Channel Pacing Threshold Pulse Width: 0.5 ms
Lead Channel Pacing Threshold Pulse Width: 1 ms
Lead Channel Sensing Intrinsic Amplitude: 10.5 mV
Lead Channel Sensing Intrinsic Amplitude: 4.5 mV
Lead Channel Setting Pacing Amplitude: 2.5 V
Lead Channel Setting Pacing Amplitude: 2.5 V
Lead Channel Setting Pacing Pulse Width: 0.5 ms
Lead Channel Setting Sensing Sensitivity: 2 mV
Pulse Gen Model: 2210
Pulse Gen Serial Number: 7364115

## 2021-08-03 ENCOUNTER — Telehealth: Payer: Self-pay | Admitting: Internal Medicine

## 2021-08-03 NOTE — Telephone Encounter (Signed)
Called pt spouse informed her that pt doesn't need an epi pen. When I reviewed CT instructions pt was told to call [phone] if became SOB once at home could be a late reaction to contrast.  All questions answered told to call back with any questions or concerns.

## 2021-08-03 NOTE — Telephone Encounter (Signed)
Patient's wife calling about paperwork for the [employer] to fill out. She states she can bring the paperwork Friday to the office. She would like a call back at: [phone].

## 2021-08-03 NOTE — Telephone Encounter (Signed)
Patient's wife states they were told the patient will have a procedure Monday and he might need an Epipen. She is following up on when she would need to get that.

## 2021-08-04 NOTE — Telephone Encounter (Signed)
**Note De-Identified Allyssia Skluzacek Obfuscation** Jana Half wanted to be sure it is ok for them to drop the pts [employer] pt asst application for Entresto off at the office tomorrow while the pt is having lab work done. I advised her that it is ok to drop them off while they are here. She states that she will write "Jeani Hawking Makaveli Hoard" on the envelope when dropping it off to be sure it gets to me.

## 2021-08-05 ENCOUNTER — Other Ambulatory Visit: Payer: Medicare Other | Admitting: *Deleted

## 2021-08-05 ENCOUNTER — Other Ambulatory Visit: Payer: Self-pay

## 2021-08-05 DIAGNOSIS — T829XXA Unspecified complication of cardiac and vascular prosthetic device, implant and graft, initial encounter: Secondary | ICD-10-CM | POA: Diagnosis not present

## 2021-08-05 DIAGNOSIS — I340 Nonrheumatic mitral (valve) insufficiency: Secondary | ICD-10-CM

## 2021-08-05 LAB — PROTIME-INR
INR: 2 — ABNORMAL HIGH (ref 0.9–1.2)
Prothrombin Time: 20.8 s — ABNORMAL HIGH (ref 9.1–12.0)

## 2021-08-05 LAB — BASIC METABOLIC PANEL
BUN/Creatinine Ratio: 16 (ref 10–24)
BUN: 19 mg/dL (ref 8–27)
CO2: 26 mmol/L (ref 20–29)
Calcium: 9.5 mg/dL (ref 8.6–10.2)
Chloride: 105 mmol/L (ref 96–106)
Creatinine, Ser: 1.19 mg/dL (ref 0.76–1.27)
Glucose: 86 mg/dL (ref 70–99)
Potassium: 4.5 mmol/L (ref 3.5–5.2)
Sodium: 142 mmol/L (ref 134–144)
eGFR: 62 mL/min/{1.73_m2} (ref >59)

## 2021-08-05 LAB — CBC
Hematocrit: 42.6 % (ref 37.5–51.0)
Hemoglobin: 14.4 g/dL (ref 13.0–17.7)
MCH: 29.1 pg (ref 26.6–33.0)
MCHC: 33.8 g/dL (ref 31.5–35.7)
MCV: 86 fL (ref 79–97)
Platelets: 255 10*3/uL (ref 150–450)
RBC: 4.94 x10E6/uL (ref 4.14–5.80)
RDW: 13.2 % (ref 11.6–15.4)
WBC: 7.1 10*3/uL (ref 3.4–10.8)

## 2021-08-08 ENCOUNTER — Encounter (HOSPITAL_COMMUNITY)
Admission: RE | Disposition: A | Discharge status: Home / Self Care | Payer: Self-pay | Source: Home / Self Care | Attending: Internal Medicine

## 2021-08-08 ENCOUNTER — Ambulatory Visit (HOSPITAL_COMMUNITY): Payer: Medicare Other | Admitting: Anesthesiology

## 2021-08-08 ENCOUNTER — Ambulatory Visit (HOSPITAL_BASED_OUTPATIENT_CLINIC_OR_DEPARTMENT_OTHER)
Admission: RE | Admit: 2021-08-08 | Discharge: 2021-08-08 | Disposition: A | Discharge status: Home / Self Care | Payer: Medicare Other | Source: Ambulatory Visit | Attending: Internal Medicine | Admitting: Internal Medicine

## 2021-08-08 ENCOUNTER — Ambulatory Visit (HOSPITAL_COMMUNITY)
Admission: RE | Admit: 2021-08-08 | Discharge: 2021-08-08 | Disposition: A | Discharge status: Home / Self Care | Payer: Medicare Other | Source: Ambulatory Visit | Attending: Cardiology | Admitting: Cardiology

## 2021-08-08 ENCOUNTER — Encounter (HOSPITAL_COMMUNITY): Payer: Self-pay

## 2021-08-08 ENCOUNTER — Encounter (HOSPITAL_COMMUNITY): Payer: Self-pay | Admitting: Internal Medicine

## 2021-08-08 ENCOUNTER — Other Ambulatory Visit: Payer: Self-pay

## 2021-08-08 ENCOUNTER — Ambulatory Visit (HOSPITAL_COMMUNITY)
Admission: RE | Admit: 2021-08-08 | Discharge: 2021-08-08 | Disposition: A | Discharge status: Home / Self Care | Payer: Medicare Other | Attending: Internal Medicine | Admitting: Internal Medicine

## 2021-08-08 DIAGNOSIS — I509 Heart failure, unspecified: Secondary | ICD-10-CM | POA: Diagnosis not present

## 2021-08-08 DIAGNOSIS — I5022 Chronic systolic (congestive) heart failure: Secondary | ICD-10-CM | POA: Insufficient documentation

## 2021-08-08 DIAGNOSIS — I110 Hypertensive heart disease with heart failure: Secondary | ICD-10-CM | POA: Diagnosis not present

## 2021-08-08 DIAGNOSIS — N289 Disorder of kidney and ureter, unspecified: Secondary | ICD-10-CM | POA: Diagnosis not present

## 2021-08-08 DIAGNOSIS — I447 Left bundle-branch block, unspecified: Secondary | ICD-10-CM | POA: Diagnosis not present

## 2021-08-08 DIAGNOSIS — I7121 Aneurysm of the ascending aorta, without rupture: Secondary | ICD-10-CM | POA: Insufficient documentation

## 2021-08-08 DIAGNOSIS — T8209XA Other mechanical complication of heart valve prosthesis, initial encounter: Secondary | ICD-10-CM | POA: Diagnosis not present

## 2021-08-08 DIAGNOSIS — I700 Atherosclerosis of aorta: Secondary | ICD-10-CM | POA: Diagnosis not present

## 2021-08-08 DIAGNOSIS — I428 Other cardiomyopathies: Secondary | ICD-10-CM | POA: Diagnosis not present

## 2021-08-08 DIAGNOSIS — I080 Rheumatic disorders of both mitral and aortic valves: Secondary | ICD-10-CM | POA: Diagnosis not present

## 2021-08-08 DIAGNOSIS — N400 Enlarged prostate without lower urinary tract symptoms: Secondary | ICD-10-CM | POA: Diagnosis not present

## 2021-08-08 DIAGNOSIS — Z950 Presence of cardiac pacemaker: Secondary | ICD-10-CM | POA: Diagnosis not present

## 2021-08-08 DIAGNOSIS — I2510 Atherosclerotic heart disease of native coronary artery without angina pectoris: Secondary | ICD-10-CM | POA: Diagnosis not present

## 2021-08-08 DIAGNOSIS — I340 Nonrheumatic mitral (valve) insufficiency: Secondary | ICD-10-CM

## 2021-08-08 DIAGNOSIS — Y848 Other medical procedures as the cause of abnormal reaction of the patient, or of later complication, without mention of misadventure at the time of the procedure: Secondary | ICD-10-CM | POA: Diagnosis not present

## 2021-08-08 DIAGNOSIS — T8201XA Breakdown (mechanical) of heart valve prosthesis, initial encounter: Secondary | ICD-10-CM

## 2021-08-08 DIAGNOSIS — E785 Hyperlipidemia, unspecified: Secondary | ICD-10-CM | POA: Diagnosis not present

## 2021-08-08 DIAGNOSIS — N3289 Other specified disorders of bladder: Secondary | ICD-10-CM | POA: Diagnosis not present

## 2021-08-08 DIAGNOSIS — I083 Combined rheumatic disorders of mitral, aortic and tricuspid valves: Secondary | ICD-10-CM | POA: Diagnosis not present

## 2021-08-08 DIAGNOSIS — Z952 Presence of prosthetic heart valve: Secondary | ICD-10-CM

## 2021-08-08 HISTORY — PX: TEE WITHOUT CARDIOVERSION: SHX5443

## 2021-08-08 LAB — ECHO TEE
AR max vel: 0.66 cm2
AV Area VTI: 0.73 cm2
AV Area mean vel: 0.72 cm2
AV Mean grad: 26 mmHg
AV Peak grad: 49.3 mmHg
Ao pk vel: 3.51 m/s
MV M vel: 5.04 m/s
MV Peak grad: 101.6 mmHg
Radius: 0.7 cm

## 2021-08-08 SURGERY — ECHOCARDIOGRAM, TRANSESOPHAGEAL
Anesthesia: Monitor Anesthesia Care

## 2021-08-08 MED ORDER — BUTAMBEN-TETRACAINE-BENZOCAINE 2-2-14 % EX AERO
INHALATION_SPRAY | CUTANEOUS | Status: DC | PRN
  Administered 2021-08-08: 2 via TOPICAL

## 2021-08-08 MED ORDER — SODIUM CHLORIDE 0.9 % IV SOLN
INTRAVENOUS | Status: AC | PRN
  Administered 2021-08-08: 500 mL via INTRAMUSCULAR

## 2021-08-08 MED ORDER — IOHEXOL 350 MG/ML SOLN
95.0000 mL | Freq: Once | INTRAVENOUS | Status: AC | PRN
  Administered 2021-08-08: 95 mL via INTRAVENOUS

## 2021-08-08 MED ORDER — PROPOFOL 10 MG/ML IV BOLUS
INTRAVENOUS | Status: DC | PRN
  Administered 2021-08-08: 20 mg via INTRAVENOUS

## 2021-08-08 MED ORDER — PROPOFOL 500 MG/50ML IV EMUL
INTRAVENOUS | Status: DC | PRN
  Administered 2021-08-08: 75 ug/kg/min via INTRAVENOUS

## 2021-08-08 NOTE — Anesthesia Preprocedure Evaluation (Signed)
Anesthesia Evaluation  Patient identified by MRN, date of birth, ID band Patient awake    Reviewed: Allergy & Precautions, NPO status , Patient's Chart, lab work & pertinent test results, reviewed documented beta blocker date and time   Airway Mallampati: II  TM Distance: >3 FB Neck ROM: Full    Dental no notable dental hx. (+) Teeth Intact, Dental Advisory Given   Pulmonary shortness of breath and with exertion, pneumonia, resolved,    breath sounds clear to auscultation       Cardiovascular hypertension, Pt. on medications and Pt. on home beta blockers +CHF  + dysrhythmias Atrial Fibrillation + pacemaker  Rhythm:Regular Rate:Bradycardia  Hx/o bicuspid AV S/P AVR Mechanical 30 years ago now with AV dysfunction  Echo 04/19/22 1. There is a mechanical aortic valve prosthesis present (unclear surgical details). 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 Vmax 3.9 m/s, MG, 33 mmHG, EOA 0.63 cm2, DI 0.16. The AT is prolonged ~150mec.  There is trivial AI which is likely washing jets (normal finding). All other findings are suggestive of severe  prosthetic valve stenosis. Recent INRs are within limits. Would recommend a cardiac CT or TEE for clarification of valve dysfunction. The aortic valve has been repaired/replaced. Aortic valve regurgitation is trivial. Severe aortic valve stenosis.  2. Left ventricular ejection fraction, by estimation, is 30 to 35%. The left ventricle has moderately decreased function. The left ventricle demonsyrates global hypokinesis. Left ventricular diastolic parameters are  consistent with Grade II diastolic dysfunction (pseudonormalization). Elevated left ventricular end-diastolic pressure. The E/e' is 25.3.  3. Right ventricular systolic function is mildly reduced. The right ventricular size is moderately enlarged. There is normal pulmonary artery systolic pressure.  4. The mitral valve is grossly normal.  Moderate to severe mitral valve regurgitation. No evidence of mitral stenosis.  5. Aneurysm of the ascending aorta, measuring 47 mm.  6. The inferior vena cava is normal in size with greater than 50% respiratory variability, suggesting right atrial pressure of 3 mmHg.  EKG 07/1021 Sinus Bradycardia, LBBB pattern   Neuro/Psych PSYCHIATRIC DISORDERS Depression negative neurological ROS     GI/Hepatic Neg liver ROS, GERD  Medicated and Controlled,  Endo/Other  Hx/o HLD  Renal/GU Renal InsufficiencyRenal disease  negative genitourinary   Musculoskeletal  (+) Arthritis , Osteoarthritis,    Abdominal   Peds  Hematology Coumadin therapy- last dose yesterday pm   Anesthesia Other Findings   Reproductive/Obstetrics ED                             Anesthesia Physical Anesthesia Plan  ASA: 3  Anesthesia Plan: MAC   Post-op Pain Management:    Induction: Intravenous  PONV Risk Score and Plan: 1 and Treatment may vary due to age or medical condition and Ondansetron  Airway Management Planned: Natural Airway and Simple Face Mask  Additional Equipment:   Intra-op Plan:   Post-operative Plan:   Informed Consent: I have reviewed the patients History and Physical, chart, labs and discussed the procedure including the risks, benefits and alternatives for the proposed anesthesia with the patient or authorized representative who has indicated his/her understanding and acceptance.     Dental advisory given  Plan Discussed with: CRNA and Anesthesiologist  Anesthesia Plan Comments:         Anesthesia Quick Evaluation

## 2021-08-08 NOTE — Interval H&P Note (Signed)
History and Physical Interval Note:  08/08/2021 12:29 PM  Noah Thompson  has presented today for surgery, with the diagnosis of PROSTHETIC AORTIC VALVE DYSFUNCTION.  The various methods of treatment have been discussed with the patient and family. After consideration of risks, benefits and other options for treatment, the patient has consented to  Procedure(s) with comments: TRANSESOPHAGEAL ECHOCARDIOGRAM (TEE) (N/A) - TAVR CT BEFORE TEE as a surgical intervention.  The patient's history has been reviewed, patient examined, no change in status, stable for surgery.  I have reviewed the patient's chart and labs.  Questions were answered to the patient's satisfaction.     Avik Leoni A Richardo Popoff  Re-entry from this AM.

## 2021-08-08 NOTE — CV Procedure (Signed)
° ° °  TRANSESOPHAGEAL ECHOCARDIOGRAM   NAME:  Noah Thompson    MRN: [id number] DOB:  Apr 28, 1941    ADMIT DATE: 08/08/2021  INDICATIONS: Prosthetic dysfunction  PROCEDURE:   Informed consent was obtained prior to the procedure. The risks, benefits and alternatives for the procedure were discussed and the patient comprehended these risks.  Risks include, but are not limited to, cough, sore throat, vomiting, nausea, somnolence, esophageal and stomach trauma or perforation, bleeding, low blood pressure, aspiration, pneumonia, infection, trauma to the teeth and death.    Procedural time out performed. The oropharynx was anesthetized with topical 1% benzocaine.    Anesthesia was administered by Dr. Malen Gauze and team.  The patient was administered a total of Propofol 174 mg to achieve and maintain moderate to deep conscious sedation.  The patient's heart rate, blood pressure, and oxygen saturation are monitored continuously during the procedure. The period of conscious sedation is 18 minutes, of which I was present face-to-face 100% of this time.   The transesophageal probe was inserted in the esophagus and stomach without difficulty and multiple views were obtained.   COMPLICATIONS:    There were no immediate complications.  KEY FINDINGS:  Significant aortic valve prosthetic dysfunction DVI 0.19.  Moderate mitral regurgitation. Full report to follow. Further management per primary team.   Riley Lam, MD [place]   [facility]  12:31 PM

## 2021-08-08 NOTE — Progress Notes (Signed)
Remote pacemaker transmission.   

## 2021-08-08 NOTE — Anesthesia Procedure Notes (Signed)
Procedure Name: MAC Date/Time: 08/08/2021 12:05 PM Performed by: Reece Agar, CRNA Pre-anesthesia Checklist: Patient identified, Emergency Drugs available, Suction available and Patient being monitored Patient Re-evaluated:Patient Re-evaluated prior to induction Oxygen Delivery Method: Nasal cannula Preoxygenation: Pre-oxygenation with 100% oxygen Induction Type: IV induction Placement Confirmation: positive ETCO2 Dental Injury: Teeth and Oropharynx as per pre-operative assessment

## 2021-08-08 NOTE — Transfer of Care (Signed)
Immediate Anesthesia Transfer of Care Note  Patient: Noah Thompson  Procedure(s) Performed: TRANSESOPHAGEAL ECHOCARDIOGRAM (TEE)  Patient Location: PACU  Anesthesia Type:MAC  Level of Consciousness: awake, alert  and oriented  Airway & Oxygen Therapy: Patient Spontanous Breathing and Patient connected to nasal cannula oxygen  Post-op Assessment: Report given to RN, Post -op Vital signs reviewed and stable and Patient moving all extremities  Post vital signs: Reviewed and stable  Last Vitals:  Vitals Value Taken Time  BP 75/57 08/08/21 1239  Temp    Pulse 47 08/08/21 1240  Resp 14 08/08/21 1240  SpO2 97 % 08/08/21 1240  Vitals shown include unvalidated device data.  Last Pain:  Vitals:   08/08/21 1044  TempSrc: Oral  PainSc: 0-No pain         Complications: No notable events documented.

## 2021-08-08 NOTE — Anesthesia Postprocedure Evaluation (Signed)
Anesthesia Post Note  Patient: Noah Thompson  Procedure(s) Performed: TRANSESOPHAGEAL ECHOCARDIOGRAM (TEE)     Patient location during evaluation: PACU Anesthesia Type: MAC Level of consciousness: awake and alert and oriented Pain management: pain level controlled Vital Signs Assessment: post-procedure vital signs reviewed and stable Respiratory status: spontaneous breathing, nonlabored ventilation and respiratory function stable Cardiovascular status: stable and blood pressure returned to baseline Postop Assessment: no apparent nausea or vomiting Anesthetic complications: no   No notable events documented.  Last Vitals:  Vitals:   08/08/21 1236 08/08/21 1242  BP:  95/61  Pulse:    Temp: 36.4 C   SpO2:      Last Pain:  Vitals:   08/08/21 1236  TempSrc:   PainSc: 0-No pain                 Etheline Geppert A.

## 2021-08-10 ENCOUNTER — Telehealth: Payer: Self-pay | Admitting: Internal Medicine

## 2021-08-10 MED ORDER — ENTRESTO 97-103 MG PO TABS: 1.0000 | ORAL_TABLET | Freq: Two times a day (BID) | ORAL | 3 refills | Status: DC

## 2021-08-10 MED ORDER — SACUBITRIL-VALSARTAN 97-103 MG PO TABS: 1.0000 | ORAL_TABLET | Freq: Two times a day (BID) | ORAL | 3 refills | Status: DC

## 2021-08-10 NOTE — Telephone Encounter (Signed)
**Note De-Identified Paulette Lynch Obfuscation** The pts wife states that she dropped the pts [employer] pt asst application off at the office on Monday morning in an envelop addressed to me. She also staes that the pt will run out of his Entresto in 6 days and is concerned that he will run out before they hear back from [employer].  She is advised that I have not received the pts application yet but will locate ASAP.  She is also aware that I will call her back concerning Entresro samples as the pt takes 97-103 mg and we may not have enough sample on hand to provide.

## 2021-08-10 NOTE — Telephone Encounter (Signed)
Paper script entresto 97-103 mg PO BID signed by MD to be faxed to pt assistance.

## 2021-08-10 NOTE — Telephone Encounter (Signed)
Patient's spouse called to talk with Jeani Hawking Via. Please call back to [phone]

## 2021-08-10 NOTE — Telephone Encounter (Signed)
**Note De-Identified Juliannah Ohmann Obfuscation** The pts [employer] pt asst application for Sherryll Burger was located and then e-mailed to me.  I have completed the providers page of his application and have e-mailed it to Dr Debby Bud nurse so she can print a Entresto 97-103 mg prescription for # 180 with 3 refills, have Dr Izora Ribas sign and date it and the application, and to fax to [employer] at the fax number written in the cover letter included or to place in the to be faxed basket in Medical Records to be faxed.   The pts wife is aware that the Entresto dose of 97-103 mg is not available in samples so we have left the pt 2 weeks of Entresto samples in the front office for them to pick up with the following instructions: The Entresto samples that we are providing the pt with today is a 49-51 mg dose and that he will have to take 2 tablets of the 49-51 mg twice a day to equal (as close as possible) to his 97-103 dose.  [name] repeated the instructions back to me correctly and verbalized understanding. She was very appreciative of our assistance.

## 2021-08-11 ENCOUNTER — Encounter (HOSPITAL_COMMUNITY): Payer: Self-pay | Admitting: Internal Medicine

## 2021-08-12 ENCOUNTER — Telehealth: Payer: Self-pay

## 2021-08-12 ENCOUNTER — Ambulatory Visit: Payer: Medicare Other

## 2021-08-12 NOTE — Telephone Encounter (Signed)
[name], [name], called reporting pt cannot make apt today and can't make an apt next week in the AM. RS pt, per request, for 2 wks.

## 2021-08-16 ENCOUNTER — Telehealth: Payer: Self-pay | Admitting: Internal Medicine

## 2021-08-16 NOTE — Telephone Encounter (Signed)
Called X2 in regard to follow up from advanced imaging. No evidence of prosthetic valve dysfunction.  Riley Lam, MD Cardiologist [facility]  [address] [place], [address] [place], [place] [address] [phone]  4:44 PM

## 2021-08-17 ENCOUNTER — Telehealth: Payer: Self-pay

## 2021-08-17 DIAGNOSIS — Z952 Presence of prosthetic heart valve: Secondary | ICD-10-CM

## 2021-08-17 DIAGNOSIS — I340 Nonrheumatic mitral (valve) insufficiency: Secondary | ICD-10-CM

## 2021-08-17 NOTE — Telephone Encounter (Signed)
The patient has been notified of the result and verbalized understanding.  All questions (if any) were answered. Macie Burows, RN 08/17/2021 8:33 AM  Pt request CT to be scheduled around 2 pm placed this in scheduling notes.  Also requested that BMP be scheduled prior to CT Aorta.

## 2021-08-17 NOTE — Telephone Encounter (Signed)
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

## 2021-08-18 ENCOUNTER — Other Ambulatory Visit: Payer: Self-pay

## 2021-08-18 ENCOUNTER — Ambulatory Visit (INDEPENDENT_AMBULATORY_CARE_PROVIDER_SITE_OTHER): Payer: Medicare Other

## 2021-08-18 ENCOUNTER — Telehealth: Payer: Self-pay | Admitting: Family Medicine

## 2021-08-18 DIAGNOSIS — Z7901 Long term (current) use of anticoagulants: Secondary | ICD-10-CM | POA: Diagnosis not present

## 2021-08-18 LAB — POCT INR: INR: 2.3 (ref 2.0–3.0)

## 2021-08-18 MED ORDER — WARFARIN SODIUM 5 MG PO TABS: ORAL_TABLET | ORAL | 0 refills | Status: DC

## 2021-08-18 NOTE — Telephone Encounter (Addendum)
Pt compliant with warfarin management. Sent in script. Received VM from pt's wife reporting pt needs refill and is currently out of warfarin.  Contacted pt's wife, Jana Half, and advised script was sent. Advised pt should have apt with coumadin clinic before next Friday because his INR has been subtherapeutic even with the last lab drawn on 2/3. She agreed and the pt will come to the [facility] location today to have INR checked. Cancelled apt for 2/24 and RS for today.

## 2021-08-18 NOTE — Telephone Encounter (Signed)
Pt needs a refill on warfarin (COUMADIN) 5 MG tablet sent to [facility]  Pt is out

## 2021-08-18 NOTE — Progress Notes (Cosign Needed Addendum)
Increase dose today to take 5 mg and then change weekly dose to take 5 mg daily except take 2.5 mg  on Sun, Tues, and Thurs. Recheck in 4 weeks per pt request.

## 2021-08-18 NOTE — Patient Instructions (Addendum)
Pre visit review using our clinic review tool, if applicable. No additional management support is needed unless otherwise documented below in the visit note.  Increase dose today to take 5 mg and then change weekly dose to take 5 mg daily except take 2.5 mg  on Sun, Tues, and Thurs. Recheck in  4 weeks.

## 2021-08-18 NOTE — Addendum Note (Signed)
Addended by: Sherrie George on: 08/18/2021 11:42 AM   Modules accepted: Orders

## 2021-08-19 ENCOUNTER — Ambulatory Visit: Payer: Medicare Other

## 2021-08-26 ENCOUNTER — Ambulatory Visit: Payer: Medicare Other

## 2021-09-01 ENCOUNTER — Ambulatory Visit (INDEPENDENT_AMBULATORY_CARE_PROVIDER_SITE_OTHER): Payer: Medicare Other

## 2021-09-01 ENCOUNTER — Other Ambulatory Visit: Payer: Self-pay

## 2021-09-01 DIAGNOSIS — I428 Other cardiomyopathies: Secondary | ICD-10-CM

## 2021-09-01 LAB — CUP PACEART REMOTE DEVICE CHECK
Battery Remaining Longevity: 4 mo
Battery Remaining Percentage: 4 %
Battery Voltage: 2.71 V
Brady Statistic AP VP Percent: 1 %
Brady Statistic AP VS Percent: 30 %
Brady Statistic AS VP Percent: 1 %
Brady Statistic AS VS Percent: 68 %
Brady Statistic RA Percent Paced: 28 %
Brady Statistic RV Percent Paced: 1 %
Date Time Interrogation Session: 20230302030821
Implantable Lead Implant Date: 19930223
Implantable Lead Implant Date: 19930223
Implantable Lead Location: 753859
Implantable Lead Location: 753860
Implantable Pulse Generator Implant Date: 20130725
Lead Channel Impedance Value: 460 Ohm
Lead Channel Impedance Value: 480 Ohm
Lead Channel Pacing Threshold Amplitude: 1.25 V
Lead Channel Pacing Threshold Amplitude: 1.25 V
Lead Channel Pacing Threshold Pulse Width: 0.5 ms
Lead Channel Pacing Threshold Pulse Width: 1 ms
Lead Channel Sensing Intrinsic Amplitude: 3.8 mV
Lead Channel Sensing Intrinsic Amplitude: 9.3 mV
Lead Channel Setting Pacing Amplitude: 2.5 V
Lead Channel Setting Pacing Amplitude: 2.5 V
Lead Channel Setting Pacing Pulse Width: 0.5 ms
Lead Channel Setting Sensing Sensitivity: 2 mV
Pulse Gen Model: 2210
Pulse Gen Serial Number: 7364115

## 2021-09-01 MED ORDER — SACUBITRIL-VALSARTAN 97-103 MG PO TABS: 1.0000 | ORAL_TABLET | Freq: Two times a day (BID) | ORAL | 2 refills | Status: DC

## 2021-09-06 ENCOUNTER — Encounter (HOSPITAL_COMMUNITY): Payer: Self-pay | Admitting: Radiology

## 2021-09-08 NOTE — Progress Notes (Signed)
Remote pacemaker transmission.   

## 2021-09-15 ENCOUNTER — Other Ambulatory Visit: Payer: Self-pay

## 2021-09-15 ENCOUNTER — Ambulatory Visit (INDEPENDENT_AMBULATORY_CARE_PROVIDER_SITE_OTHER): Payer: Medicare Other

## 2021-09-15 DIAGNOSIS — Z7901 Long term (current) use of anticoagulants: Secondary | ICD-10-CM

## 2021-09-15 LAB — POCT INR: INR: 2.7 (ref 2.0–3.0)

## 2021-09-15 NOTE — Progress Notes (Signed)
Continue 5 mg daily except take 2.5 mg  on Sun, Tues, and Thurs. Recheck in  4 weeks. ?

## 2021-09-15 NOTE — Patient Instructions (Addendum)
Pre visit review using our clinic review tool, if applicable. No additional management support is needed unless otherwise documented below in the visit note. ? ?Continue 5 mg daily except take 2.5 mg  on Sun, Tues, and Thurs. Recheck in  4 weeks. ?

## 2021-09-21 ENCOUNTER — Ambulatory Visit: Payer: Medicare Other | Admitting: Dermatology

## 2021-09-29 ENCOUNTER — Ambulatory Visit: Payer: Medicare Other | Admitting: Internal Medicine

## 2021-10-03 ENCOUNTER — Ambulatory Visit (INDEPENDENT_AMBULATORY_CARE_PROVIDER_SITE_OTHER): Payer: Medicare Other

## 2021-10-03 ENCOUNTER — Other Ambulatory Visit: Payer: Self-pay | Admitting: Internal Medicine

## 2021-10-03 DIAGNOSIS — I428 Other cardiomyopathies: Secondary | ICD-10-CM

## 2021-10-03 LAB — CUP PACEART REMOTE DEVICE CHECK
Battery Remaining Longevity: 3 mo
Battery Remaining Percentage: 3 %
Battery Voltage: 2.69 V
Brady Statistic AP VP Percent: 1 %
Brady Statistic AP VS Percent: 29 %
Brady Statistic AS VP Percent: 1 %
Brady Statistic AS VS Percent: 70 %
Brady Statistic RA Percent Paced: 27 %
Brady Statistic RV Percent Paced: 1 %
Date Time Interrogation Session: 20230402021913
Implantable Lead Implant Date: 19930223
Implantable Lead Implant Date: 19930223
Implantable Lead Location: 753859
Implantable Lead Location: 753860
Implantable Pulse Generator Implant Date: 20130725
Lead Channel Impedance Value: 440 Ohm
Lead Channel Impedance Value: 450 Ohm
Lead Channel Pacing Threshold Amplitude: 1.25 V
Lead Channel Pacing Threshold Amplitude: 1.25 V
Lead Channel Pacing Threshold Pulse Width: 0.5 ms
Lead Channel Pacing Threshold Pulse Width: 1 ms
Lead Channel Sensing Intrinsic Amplitude: 11 mV
Lead Channel Sensing Intrinsic Amplitude: 4.4 mV
Lead Channel Setting Pacing Amplitude: 2.5 V
Lead Channel Setting Pacing Amplitude: 2.5 V
Lead Channel Setting Pacing Pulse Width: 0.5 ms
Lead Channel Setting Sensing Sensitivity: 2 mV
Pulse Gen Model: 2210
Pulse Gen Serial Number: 7364115

## 2021-10-06 ENCOUNTER — Telehealth: Payer: Self-pay | Admitting: Internal Medicine

## 2021-10-06 DIAGNOSIS — R9389 Abnormal findings on diagnostic imaging of other specified body structures: Secondary | ICD-10-CM

## 2021-10-06 DIAGNOSIS — Z952 Presence of prosthetic heart valve: Secondary | ICD-10-CM

## 2021-10-06 NOTE — Telephone Encounter (Signed)
Called Patient left voicemail. ? 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 ? ?There was a small urologic finding and urology referral was recommended. ? ?Patient was referred to urology. ? ?I have yet to see that patient has been to urology and wanted to make sure that he had heard back. ? ?Called to follow up. ? ?If patient calls back please refer him to urology: ? 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   ? ?It is important that we make sure this is just an incidental finding. ? ? ?Riley Lam, MD ?Cardiologist ?[facility]  ?[address] [place], [place] ?[place], [place] [address] ?[phone]  ?5:24 PM ? ?

## 2021-10-07 ENCOUNTER — Telehealth: Payer: Self-pay | Admitting: Internal Medicine

## 2021-10-07 NOTE — Telephone Encounter (Signed)
Called pt notified of incidental finding from CT.  Advised pt that urology consult placed and it's important that pt is seen by urology.  Also, advised pt that CT aorta is needed in Sept 2023 and labs will need to be drawn prior to appointment.  Spouse wants to know if pt should schedule an OV with Dr. Izora Ribas as pt canceled 09/29/21 appointment d/t not feeling up to coming into the office.  Non-urgent TCTS referral placed.  Advised pt that I will send message to MD to advise.   ?

## 2021-10-07 NOTE — Addendum Note (Signed)
Addended by: Macie Burows on: 10/07/2021 02:35 PM ? ? Modules accepted: Orders ? ?

## 2021-10-07 NOTE — Telephone Encounter (Signed)
Called Patient ?He is willing to see urology. ?No symptoms. ?We discussed timing of follow up. ? ?We will see patient in Fall of 2023 unless new symptoms.  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 ?Patient had no further questions. ? ?Riley Lam, MD ?Cardiologist ?[facility]  ?[address] [place], [place] ?[place], [place] [address] ?[phone]  ?2:57 PM ? ?

## 2021-10-13 ENCOUNTER — Ambulatory Visit (INDEPENDENT_AMBULATORY_CARE_PROVIDER_SITE_OTHER): Payer: Medicare Other

## 2021-10-13 DIAGNOSIS — Z7901 Long term (current) use of anticoagulants: Secondary | ICD-10-CM | POA: Diagnosis not present

## 2021-10-13 LAB — POCT INR: INR: 2.8 (ref 2.0–3.0)

## 2021-10-13 NOTE — Patient Instructions (Addendum)
Pre visit review using our clinic review tool, if applicable. No additional management support is needed unless otherwise documented below in the visit note. ? ?Continue 5 mg daily except take 2.5 mg  on Sun, Tues, and Thurs. Recheck in  5 weeks. ?

## 2021-10-13 NOTE — Progress Notes (Addendum)
Continue 5 mg daily except take 2.5 mg  on Sun, Tues, and Thurs. Recheck in  5 weeks. ?

## 2021-10-14 NOTE — Progress Notes (Signed)
Remote pacemaker transmission.   

## 2021-10-17 ENCOUNTER — Telehealth: Payer: Self-pay | Admitting: Internal Medicine

## 2021-10-17 NOTE — Telephone Encounter (Signed)
I spoke with the pts wife and advised her that I will forward to Dr. 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 we will reach back out after Dr. Debby Bud nurse has a chance to review.  ?

## 2021-10-17 NOTE — Telephone Encounter (Signed)
Patient's wife is calling wanting to know why Noah Thompson was referred to Dr. Vivi Martens office. Dr. Vivi Martens office stated they were unsure why. ?

## 2021-10-18 NOTE — Telephone Encounter (Signed)
Called pt spouse informed her of MD reason for referral.  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  Spouse advised that pt would f/u with Dr. Cyndia Bent.  No further questions or concerns. ?

## 2021-10-25 ENCOUNTER — Encounter: Payer: Self-pay | Admitting: Internal Medicine

## 2021-10-28 ENCOUNTER — Ambulatory Visit (INDEPENDENT_AMBULATORY_CARE_PROVIDER_SITE_OTHER): Payer: Medicare Other

## 2021-10-28 DIAGNOSIS — I428 Other cardiomyopathies: Secondary | ICD-10-CM

## 2021-10-28 LAB — CUP PACEART REMOTE DEVICE CHECK
Battery Remaining Longevity: 3 mo
Battery Remaining Percentage: 2 %
Battery Voltage: 2.68 V
Brady Statistic AP VP Percent: 1 %
Brady Statistic AP VS Percent: 31 %
Brady Statistic AS VP Percent: 1 %
Brady Statistic AS VS Percent: 68 %
Brady Statistic RA Percent Paced: 28 %
Brady Statistic RV Percent Paced: 1 %
Date Time Interrogation Session: 20230428022611
Implantable Lead Implant Date: 19930223
Implantable Lead Implant Date: 19930223
Implantable Lead Location: 753859
Implantable Lead Location: 753860
Implantable Pulse Generator Implant Date: 20130725
Lead Channel Impedance Value: 460 Ohm
Lead Channel Impedance Value: 460 Ohm
Lead Channel Pacing Threshold Amplitude: 1.25 V
Lead Channel Pacing Threshold Amplitude: 1.25 V
Lead Channel Pacing Threshold Pulse Width: 0.5 ms
Lead Channel Pacing Threshold Pulse Width: 1 ms
Lead Channel Sensing Intrinsic Amplitude: 10.2 mV
Lead Channel Sensing Intrinsic Amplitude: 4.4 mV
Lead Channel Setting Pacing Amplitude: 2.5 V
Lead Channel Setting Pacing Amplitude: 2.5 V
Lead Channel Setting Pacing Pulse Width: 0.5 ms
Lead Channel Setting Sensing Sensitivity: 2 mV
Pulse Gen Model: 2210
Pulse Gen Serial Number: 7364115

## 2021-11-11 DIAGNOSIS — H5203 Hypermetropia, bilateral: Secondary | ICD-10-CM | POA: Diagnosis not present

## 2021-11-11 DIAGNOSIS — H524 Presbyopia: Secondary | ICD-10-CM | POA: Diagnosis not present

## 2021-11-11 NOTE — Progress Notes (Signed)
Remote pacemaker transmission.   

## 2021-11-14 ENCOUNTER — Telehealth: Payer: Self-pay

## 2021-11-14 DIAGNOSIS — Z7901 Long term (current) use of anticoagulants: Secondary | ICD-10-CM

## 2021-11-14 MED ORDER — WARFARIN SODIUM 5 MG PO TABS: ORAL_TABLET | ORAL | 1 refills | Status: DC

## 2021-11-14 NOTE — Telephone Encounter (Signed)
Pt is compliant with warfarin management and PCP apts.  ?Sent in refill to [facility] ?

## 2021-11-14 NOTE — Addendum Note (Signed)
Addended by: Sherrie George on: 11/14/2021 03:50 PM ? ? Modules accepted: Orders ? ?

## 2021-11-17 ENCOUNTER — Ambulatory Visit (INDEPENDENT_AMBULATORY_CARE_PROVIDER_SITE_OTHER): Payer: Medicare Other

## 2021-11-17 DIAGNOSIS — Z7901 Long term (current) use of anticoagulants: Secondary | ICD-10-CM | POA: Diagnosis not present

## 2021-11-17 LAB — POCT INR: INR: 2.6 (ref 2.0–3.0)

## 2021-11-17 NOTE — Progress Notes (Signed)
Continue 5 mg daily except take 2.5 mg  on Sun, Tues, and Thurs. Recheck in  6 weeks. 

## 2021-11-17 NOTE — Patient Instructions (Addendum)
Pre visit review using our clinic review tool, if applicable. No additional management support is needed unless otherwise documented below in the visit note.  Continue 5 mg daily except take 2.5 mg  on Sun, Tues, and Thurs. Recheck in  6 weeks. 

## 2021-12-07 ENCOUNTER — Institutional Professional Consult (permissible substitution): Payer: Medicare Other | Admitting: Surgery

## 2021-12-07 ENCOUNTER — Encounter: Payer: Self-pay | Admitting: Surgery

## 2021-12-07 VITALS — BP 132/65 | HR 60 | Resp 20 | Ht 71.0 in

## 2021-12-07 DIAGNOSIS — I712 Thoracic aortic aneurysm, without rupture, unspecified: Secondary | ICD-10-CM | POA: Diagnosis not present

## 2021-12-07 DIAGNOSIS — Z952 Presence of prosthetic heart valve: Secondary | ICD-10-CM

## 2021-12-07 NOTE — Progress Notes (Signed)
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  He has continued to feel well and remains active with no symptoms whatsoever.  He had a 2D echo in 2015 showing an ejection fraction of 20 to 25% with a mean gradient across the aortic valve prosthesis of 30 mmHg and a valve area measured at 0.63 cm.  He had a follow-up echocardiogram in 2016 and ejection fraction was unchanged at 20 to 25% with a mean gradient of 25 mmHg across the aortic valve prosthesis.  His next echocardiogram in March 2017 showed an improvement in his ejection fraction to 40 to 45% with a mean gradient across aortic valve of 22 mmHg.  Echocardiogram in June 2018 showed an ejection fraction of 30 to 35% with a mean gradient of 20 mmHg.  His next echocardiogram was not until June 2021 and it showed an ejection fraction of 40 to 45% with a mean gradient of 23 mmHg.  There was dilation of the ascending aorta noted at 4.9 cm which was unchanged dating back to 2018.  His echocardiogram last October showed an ejection fraction of 30 to 35% with a mean gradient of 33 mmHg across the aortic valve prosthesis.  He has remained asymptomatic but there was suddenly concerned about patient/prosthesis mismatch and he had a gated cardiac CTA on 08/08/2021 which showed normal opening and closing angle of the St. Jude aortic valve with no significant pannus around the valve.  Measurement suggested that this was a 19 mm valve.  Annular diameter was 18 mm.  The  ascending aorta was measured at 4.9 cm which is unchanged.  His aortic arch was 3.9 cm and the descending aorta 2.3 cm.  The sinus of Valsalva measurements were between 31 and 34 mm.  His coronary calcium score was 206.  He also underwent a TEE on 08/08/2021 which showed an ejection fraction of 30 to 35%.  The mean gradient across the aortic valve prosthesis was measured at 26 mmHg with a dimensionless index of 0.19 and a valve area by VTI of 0.73 cm.  There was moderate mitral regurgitation.  The ascending aorta was measured at 4.9 cm.  He is here today with his wife.  He is retired but remains very active.   Past Medical History:  Diagnosis Date   CAP (community acquired pneumonia) 07/25/2017   Cataract    left eye   CHF (congestive heart failure) (HCC)    Complete heart block (HCC)    a. 1993 s/p PPM 2/2 perioperative heart block;  b. 01/2012 s/p SJM Accent DC PPM upgrade.   Depression    GERD (gastroesophageal reflux disease)    History of nephritis    a. as child.   History of pneumonia    Hyperlipidemia    Hypertension    NICM (nonischemic cardiomyopathy) (HCC)    a. 06/2012 Echo: EF 45-50%;  b. 08/2013 Echo: EF 20-25%;  c. 10/2013 Adenosine MV: Fixed septal defect likely representing LBBB-related artifact, no reversible ischemia, EF 43%, low risk.  Paroxysmal atrial tachycardia ([place])    a. 07/2013 noted on device interrogation.   Presence of permanent cardiac pacemaker 2013   RA (rheumatoid arthritis) ([place])    S/P aortic valve replacement    a. 1993 s/p SJM mechanical AVR for aortic stenosis - chronic coumadin.   Shortness of breath dyspnea     Past Surgical History:  Procedure Laterality Date   ANAL FISSURE REPAIR  1970s   AORTIC VALVE REPLACEMENT  1993   St. Jude Mechanical Valve   CATARACT EXTRACTION, BILATERAL Bilateral 02/2020   1 wk apart   INSERT / REPLACE / REMOVE PACEMAKER     PACEMAKER INSERTION  1993   for AV block, not pacemaker dependant   PERMANENT PACEMAKER  GENERATOR CHANGE N/A 01/25/2012   Procedure: PERMANENT PACEMAKER GENERATOR CHANGE;  Surgeon: Thompson Grayer, MD;  Location: [facility] CATH LAB;  Service: Cardiovascular;  Laterality: N/A;   TEE WITHOUT CARDIOVERSION N/A 08/08/2021   Procedure: TRANSESOPHAGEAL ECHOCARDIOGRAM (TEE);  Surgeon: Werner Lean, MD;  Location: [facility] ENDOSCOPY;  Service: Cardiovascular;  Laterality: N/A;  TAVR CT BEFORE TEE    Family History  Problem Relation Age of Onset   Arthritis Mother    Hypertension Mother    Arthritis Father    Heart disease Father    Prostate cancer Father 32   Hypertension Father    Congestive Heart Failure Father    Coronary artery disease Neg Hx    Stroke Neg Hx    Diabetes Neg Hx     Social History Social History   Tobacco Use   Smoking status: Never   Smokeless tobacco: Never  Vaping Use   Vaping Use: Never used  Substance Use Topics   Alcohol use: No   Drug use: No    Current Outpatient Medications  Medication Sig Dispense Refill   acetaminophen (TYLENOL) 500 MG tablet Take 500-1,000 mg by mouth every 6 (six) hours as needed (for pain.).     alum & mag hydroxide-simeth (MAALOX/MYLANTA) 200-200-20 MG/5ML suspension Take 15 mLs by mouth every 6 (six) hours as needed for indigestion or heartburn.     loperamide (IMODIUM) 2 MG capsule Take 1 capsule (2 mg total) by mouth 4 (four) times daily as needed for diarrhea or loose stools.     loratadine (CLARITIN) 10 MG tablet Take 1 tablet (10 mg total) by mouth daily as needed for allergies.     metoprolol succinate (TOPROL-XL) 50 MG 24 hr tablet Take 1 tablet by mouth once daily 90 tablet 2   Misc Natural Products (IMMUNE FORMULA PO) Take 2 tablets by mouth in the morning. Vitafusion Triple Immune Power Gummy Vitamins     omeprazole (PRILOSEC) 40 MG capsule Take 1 capsule (40 mg total) by mouth daily. Take daily for 3 days then as needed (Patient taking differently: Take 40 mg by mouth daily as needed (indigestion/stomach pain.).)  30 capsule 3   sacubitril-valsartan (ENTRESTO) 97-103 MG Take 1 tablet by mouth 2 (two) times daily. 180 tablet 2   warfarin (COUMADIN) 5 MG tablet TAKE 1 TABLET BY MOUTH DAILY EXCEPT TAKE 1/2 TABLET ON SUNDAYS, TUESDAYS AND THURSDAYS OR AS DIRECTED BY ANTICOAGULATION CLINIC 110 tablet 1   No current facility-administered medications for this visit.    Allergies  Allergen Reactions   Coreg [Carvedilol] Rash   Lisinopril Rash   Tessalon [Benzonatate] Other (See Comments)    abd pain    Review of Systems  Constitutional:  Negative for activity change and fatigue.  HENT:  Negative.    Eyes: Negative.   Respiratory:  Negative for chest tightness and shortness of breath.   Cardiovascular:  Negative for chest pain, palpitations and leg swelling.  Gastrointestinal: Negative.   Endocrine: Negative.   Genitourinary: Negative.   Musculoskeletal: Negative.   Skin: Negative.   Allergic/Immunologic: Negative.   Neurological:  Negative for dizziness and syncope.  Hematological: Negative.   Psychiatric/Behavioral: Negative.      BP 132/65 (BP Location: Right Arm, Patient Position: Sitting)   Pulse 60   Resp 20   Ht 5\' 11"  (1.803 m)   SpO2 98% Comment: RA  BMI 23.71 kg/m  Physical Exam Constitutional:      Appearance: Normal appearance. He is normal weight.  HENT:     Head: Normocephalic and atraumatic.     Mouth/Throat:     Mouth: Mucous membranes are moist.     Pharynx: Oropharynx is clear.  Eyes:     Extraocular Movements: Extraocular movements intact.     Conjunctiva/sclera: Conjunctivae normal.     Pupils: Pupils are equal, round, and reactive to light.  Cardiovascular:     Rate and Rhythm: Normal rate and regular rhythm.     Pulses: Normal pulses.     Heart sounds: Murmur heard.     Comments: Crisp mechanical valve click.  2/6 systolic murmur RSB. Pulmonary:     Effort: Pulmonary effort is normal.     Breath sounds: Normal breath sounds.  Abdominal:     General:  Abdomen is flat.     Palpations: Abdomen is soft.     Tenderness: There is no abdominal tenderness.  Musculoskeletal:        General: No swelling.     Cervical back: Normal range of motion and neck supple.  Skin:    General: Skin is warm and dry.  Neurological:     General: No focal deficit present.     Mental Status: He is alert and oriented to person, place, and time.  Psychiatric:        Mood and Affect: Mood normal.        Behavior: Behavior normal.      Diagnostic Tests:  TRANSESOPHOGEAL ECHO REPORT         Patient Name:   BARRINGTON RIHN Date of Exam: 08/08/2021  Medical Rec #:  [id number]       Height:       71.0 in  Accession #:    [id number]      Weight:       170.0 lb  Date of Birth:  Sep 19, 1940       BSA:          1.968 m  Patient Age:    38 years        BP:           151/90 mmHg  Patient Gender: M               HR:           58 bpm.  Exam Location:  Inpatient   Procedure: Transesophageal Echo, 3D Echo, Color Doppler and Cardiac  Doppler   Indications:    Mitral Regurgitation i34.0     History:        Patient has prior history of Echocardiogram examinations,  most                  recent 04/19/2021. Pacemaker; Risk Factors:Hypertension  and  Dyslipidemia.     Sonographer:    Raquel Sarna Senior RDCS  Referring Phys: [id number] [facility] A CHANDRASEKHAR   PROCEDURE: After discussion of the risks and benefits of a TEE, an  informed consent was obtained from the patient. The transesophogeal probe  was passed without difficulty through the esophogus of the patient.  Sedation performed by different physician.  The patient was monitored while under deep sedation. Anesthestetic  sedation was provided intravenously by Anesthesiology: 174mg  of Propofol.  The patient developed no complications during the procedure.   IMPRESSIONS     1. The aortic valve has been replaced by a St. Jude Mechanical Aortic  Valve. Aortic valve regurgitation is trivial. Effective  orifice area, by  VTI measures 0.73 cm. Aortic valve mean gradient measures 26.0 mmHg.  Aortic valve Vmax measures 3.51 m/s. DVI   0.19. Acceleration time 112 ms.      Severe prosthetic dysfunction noted.   2. Left ventricular ejection fraction, by estimation, is 30 to 35%. The  left ventricle has moderately decreased function. The left ventricle has  no regional wall motion abnormalities.   3. Right ventricular systolic function is low normal. The right  ventricular size is mildly enlarged.   4. No left atrial/left atrial appendage thrombus was detected.   5. The mitral valve is grossly normal. Moderate mitral valve  regurgitation. No evidence of mitral stenosis. Pulmonary vein blunting  without reversal. VCA of largest MR jet 0.21 cm2.   6. Aortic dilatation noted. There is moderate dilatation of the ascending  aorta, measuring 49 mm.   Conclusion(s)/Recommendation(s): Will send to heart team for  multi-disciplinary discussion.   FINDINGS   Left Ventricle: Left ventricular ejection fraction, by estimation, is 30  to 35%. The left ventricle has moderately decreased function. The left  ventricle has no regional wall motion abnormalities. The left ventricular  internal cavity size was normal in  size.   Right Ventricle: The right ventricular size is mildly enlarged. Right  vetricular wall thickness was not assessed. Right ventricular systolic  function is low normal.   Left Atrium: Left atrial size was normal in size. No left atrial/left  atrial appendage thrombus was detected.   Right Atrium: Right atrial size was normal in size.   Pericardium: There is no evidence of pericardial effusion.   Mitral Valve: The mitral valve is grossly normal. Moderate mitral valve  regurgitation. No evidence of mitral valve stenosis.   Tricuspid Valve: The tricuspid valve is normal in structure. Tricuspid  valve regurgitation is mild . No evidence of tricuspid stenosis.   Aortic Valve: The  aortic valve has been repaired/replaced. Aortic valve  regurgitation is trivial. Aortic valve mean gradient measures 26.0 mmHg.  Aortic valve peak gradient measures 49.3 mmHg. Aortic valve area, by VTI  measures 0.73 cm.   Pulmonic Valve: The pulmonic valve was normal in structure. Pulmonic valve  regurgitation is not visualized.   Aorta: Aortic dilatation noted and the aortic root is normal in size and  structure. There is moderate dilatation of the ascending aorta, measuring  49 mm.   IAS/Shunts: No atrial level shunt detected by color flow Doppler.   Additional Comments: A device lead is visualized.      LEFT VENTRICLE  PLAX 2D  LVOT diam:     2.10 cm  LV SV:         59  LV SV Index:   30  LVOT Area:     3.46 cm  AORTIC VALVE  AV Area (Vmax):    0.66 cm  AV Area (Vmean):   0.72 cm  AV Area (VTI):     0.73 cm  AV Vmax:           351.00 cm/s  AV Vmean:          239.000 cm/s  AV VTI:            0.805 m  AV Peak Grad:      49.3 mmHg  AV Mean Grad:      26.0 mmHg  LVOT Vmax:         66.90 cm/s  LVOT Vmean:        50.000 cm/s  LVOT VTI:          0.170 m  LVOT/AV VTI ratio: 0.21   MR Peak grad:    101.6 mmHg  MR Mean grad:    46.0 mmHg    SHUNTS  MR Vmax:         504.00 cm/s  Systemic VTI:  0.17 m  MR Vmean:        307.0 cm/s   Systemic Diam: 2.10 cm  MR PISA:         3.08 cm  MR PISA Eff ROA: 24 mm  MR PISA Radius:  0.70 cm   Rudean Haskell MD  Electronically signed by Rudean Haskell MD  Signature Date/Time: 08/08/2021/4:53:53 PM         Final     ADDENDUM REPORT: 08/09/2021 15:58   CLINICAL DATA:  Aortic Valve pathology with assessment of prosthetic valve   EXAM: Cardiac TAVR CT   TECHNIQUE: The patient was scanned on a Siemens Force [id number] slice scanner. A 120 kV retrospective scan was triggered in the descending thoracic aorta at 111 HU's. Gantry rotation speed was 270 msecs and collimation was .9 mm. No beta blockade or nitro were  given. The 3D data set was reconstructed in 5% intervals of the R-R cycle. Systolic and diastolic phases were analyzed on a dedicated work station using MPR, MIP and VRT modes. The patient received 95 cc of contrast.   FINDINGS: Prosthetic Valve: There is a Recruitment consultant Aortic Valve   Opening angle: 13 degrees (normal < 20 degrees) without evidence leaflet restriction.   Closing angle: 120 degrees (normal 120 degrees)   Level of coronary ostia above tilting disks: 9 mm (left), 14 mm (right)   No significant pannus within the valve.   Aortic Annulus Measurements- 20% Phase   Major annulus diameter: 18 mm   Minor annulus diameter:18 mm   Annular perimeter: 56 mm   Annular area: Area 2.41 cm2   Suspect 19 mm St. Jude Bileaflet Valve as the native valve   Aortic Root Measurements   Sinotubular Junction: 38 m   Ascending Thoracic Aorta: 49 mm   Aortic Arch: 39 mm   Descending Thoracic Aorta: 23 mm   Sinus of Valsalva Measurements:   Right coronary cusp width: 32 mm   Left coronary cusp width: 31 mm   Non coronary cusp width: 34 mm   Mean diameter: 34 mm   Non SAVR Valve Findings:   There is a dual chamber pacemaker with leads noted into the SVC.   The atrial lead terminates in the right atrial appendage.   The ventricular lead terminates in the right ventricular apex.   No interatrial shunting.   Normal pulmonary artery size.   Normal pulmonary vein drainage.   Patent  left atrial appendage.   Coronary Calcium Score:   Left main: 1   Left anterior descending artery: 65   Left circumflex artery: 140   Right coronary artery: 0   Total: 206   Percentile: 5 th for age, sex, and race matched control.   IMPRESSION: 1. Mechanical Aortic valve. There is normal opening and closing angle of the prosthesis, with no significant pannus or evidence of pannus.   2. Patient's total coronary artery calcium score is 206, which is 37th  percentile for subjects of the same age, gender, and race based populations.   3. 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 Will need 6 month follow up study of aorta.   4. Study consistent with patient prosthesis mismatch.   RECOMMENDATIONS:   Coronary artery calcium (CAC) score is a strong predictor of incident coronary heart disease (CHD) and provides predictive information beyond traditional risk factors. CAC scoring is reasonable to use in the decision to withhold, postpone, or initiate statin therapy in intermediate-risk or selected borderline-risk asymptomatic adults (age 46-75 years and LDL-C >=70 to <190 mg/dL) who do not have diabetes or established atherosclerotic cardiovascular disease (ASCVD).* In intermediate-risk (10-year ASCVD risk >=7.5% to <20%) adults or selected borderline-risk (10-year ASCVD risk >=5% to <7.5%) adults in whom a CAC score is measured for the purpose of making a treatment decision the following recommendations have been made:   If CAC = 0, it is reasonable to withhold statin therapy and reassess in 5 to 10 years, as long as higher risk conditions are absent (diabetes mellitus, family history of premature CHD in first degree relatives (males <55 years; females <65 years), cigarette smoking, LDL >=190 mg/dL or other independent risk factors).   If CAC is 1 to 99, it is reasonable to initiate statin therapy for patients >=63 years of age.   If CAC is >=100 or >=75th percentile, it is reasonable to initiate statin therapy at any age.   Cardiology referral should be considered for patients with CAC scores >=400 or >=75th percentile.   *2018 AHA/ACC/AACVPR/AAPA/ABC/ACPM/ADA/AGS/APhA/ASPC/NLA/PCNA Guideline on the Management of Blood Cholesterol: A Report of the American College of Cardiology/American Heart Association Task Force on Clinical Practice Guidelines. J Am Coll Cardiol. 2019;73(24):3168-3209.   Mahesh  Chandrasekhar      Electronically Signed   By: Rudean Haskell M.D.   On: 08/09/2021 15:58        Impression:  This 81 year old active gentleman has a small mechanical aortic valve which is felt to be a 19 mm St. Jude valve based on the 18 mm annular dimension measured on cardiac CTA.  His gradient has been relatively stable in the 20s dating back to 2015 and his ejection fraction was 20 to 25% in the past, improved to 40 to 45% around 2017 and then decreased to 30 to 35% in October 2022.  He has patient prosthesis mismatch which is expected with a small mechanical valve but this has been present since insertion in 1993 and he has done relatively well.  I do not think his recent decrease in ejection fraction is related to the valve necessarily since his ejection fraction was actually worse in the past.  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  I do not think I would recommend redo sternotomy for aortic root replacement and replacement of his ascending aorta at 81 years old in an asymptomatic patient.  There is  a good chance that he would have a complicated postoperative course and may not return to being as active and independent as he is now.  I reviewed his echocardiogram and CT images with him and his wife and answered all of their questions.  I have recommended that he have a follow-up CTA of the chest in 1 year to reassess his aorta.  I discussed the importance of good blood pressure control in preventing further enlargement of his aorta and acute aortic dissection.  Plan:  He will return to see me in 1 year with a CTA of the chest.  He will continue to follow-up with cardiology.  I spent 60 minutes performing this consultation and > 50% of this time was spent face to face counseling and coordinating the care of this patient's small mechanical aortic valve with patient/prosthesis mismatch and ascending aortic aneurysm.   Gaye Pollack, MD [facility] [phone]

## 2022-01-05 ENCOUNTER — Ambulatory Visit (INDEPENDENT_AMBULATORY_CARE_PROVIDER_SITE_OTHER): Payer: Medicare Other

## 2022-01-05 DIAGNOSIS — Z7901 Long term (current) use of anticoagulants: Secondary | ICD-10-CM

## 2022-01-05 LAB — POCT INR: INR: 3.3 — AB (ref 2.0–3.0)

## 2022-01-05 NOTE — Patient Instructions (Addendum)
Pre visit review using our clinic review tool, if applicable. No additional management support is needed unless otherwise documented below in the visit note.  Continue 5 mg daily except take 2.5 mg  on Sun, Tues, and Thurs. Recheck in  6 weeks.

## 2022-01-05 NOTE — Progress Notes (Signed)
Continue 5 mg daily except take 2.5 mg  on Sun, Tues, and Thurs. Recheck in  6 weeks.

## 2022-01-27 ENCOUNTER — Ambulatory Visit (INDEPENDENT_AMBULATORY_CARE_PROVIDER_SITE_OTHER): Payer: Medicare Other

## 2022-01-27 ENCOUNTER — Encounter: Payer: Self-pay | Admitting: Internal Medicine

## 2022-01-27 DIAGNOSIS — I428 Other cardiomyopathies: Secondary | ICD-10-CM

## 2022-01-27 NOTE — Telephone Encounter (Signed)
error 

## 2022-01-30 LAB — CUP PACEART REMOTE DEVICE CHECK
Battery Remaining Longevity: 1 mo
Battery Remaining Percentage: 1 %
Battery Voltage: 2.65 V
Brady Statistic AP VP Percent: 1 %
Brady Statistic AP VS Percent: 32 %
Brady Statistic AS VP Percent: 1 %
Brady Statistic AS VS Percent: 67 %
Brady Statistic RA Percent Paced: 29 %
Brady Statistic RV Percent Paced: 1 %
Date Time Interrogation Session: 20230728165658
Implantable Lead Implant Date: 19930223
Implantable Lead Implant Date: 19930223
Implantable Lead Location: 753859
Implantable Lead Location: 753860
Implantable Pulse Generator Implant Date: 20130725
Lead Channel Impedance Value: 450 Ohm
Lead Channel Impedance Value: 530 Ohm
Lead Channel Pacing Threshold Amplitude: 1.25 V
Lead Channel Pacing Threshold Amplitude: 1.25 V
Lead Channel Pacing Threshold Pulse Width: 0.5 ms
Lead Channel Pacing Threshold Pulse Width: 1 ms
Lead Channel Sensing Intrinsic Amplitude: 12 mV
Lead Channel Sensing Intrinsic Amplitude: 3.8 mV
Lead Channel Setting Pacing Amplitude: 2.5 V
Lead Channel Setting Pacing Amplitude: 2.5 V
Lead Channel Setting Pacing Pulse Width: 0.5 ms
Lead Channel Setting Sensing Sensitivity: 2 mV
Pulse Gen Model: 2210
Pulse Gen Serial Number: 7364115

## 2022-02-15 NOTE — Progress Notes (Signed)
Remote pacemaker transmission.   

## 2022-02-16 ENCOUNTER — Ambulatory Visit (INDEPENDENT_AMBULATORY_CARE_PROVIDER_SITE_OTHER): Payer: Medicare Other

## 2022-02-16 DIAGNOSIS — Z7901 Long term (current) use of anticoagulants: Secondary | ICD-10-CM

## 2022-02-16 LAB — POCT INR: INR: 4 — AB (ref 2.0–3.0)

## 2022-02-16 NOTE — Progress Notes (Addendum)
Pt reports he has started drinking cranberry juice. Advised this will increase risk of bleeding. Adjusted dosing to cover.  Hold dose today and then change weekly dose to take 1/2 tablet daily except take 1 tablet on Mondays, Wednesdays and Fridays. Recheck in 3 weeks.

## 2022-02-16 NOTE — Patient Instructions (Addendum)
Pre visit review using our clinic review tool, if applicable. No additional management support is needed unless otherwise documented below in the visit note.  Hold dose today and then change weekly dose to take 1/2 tablet daily except take 1 tablet on Mondays, Wednesdays and Fridays. Recheck in 3 weeks.

## 2022-03-02 ENCOUNTER — Ambulatory Visit (INDEPENDENT_AMBULATORY_CARE_PROVIDER_SITE_OTHER): Payer: Medicare Other

## 2022-03-02 DIAGNOSIS — I428 Other cardiomyopathies: Secondary | ICD-10-CM

## 2022-03-02 LAB — CUP PACEART REMOTE DEVICE CHECK
Battery Remaining Longevity: 1 mo
Battery Remaining Percentage: 1 %
Battery Voltage: 2.63 V
Brady Statistic AP VP Percent: 1 %
Brady Statistic AP VS Percent: 32 %
Brady Statistic AS VP Percent: 1 %
Brady Statistic AS VS Percent: 67 %
Brady Statistic RA Percent Paced: 30 %
Brady Statistic RV Percent Paced: 1 %
Date Time Interrogation Session: 20230831031317
Implantable Lead Implant Date: 19930223
Implantable Lead Implant Date: 19930223
Implantable Lead Location: 753859
Implantable Lead Location: 753860
Implantable Pulse Generator Implant Date: 20130725
Lead Channel Impedance Value: 460 Ohm
Lead Channel Impedance Value: 530 Ohm
Lead Channel Pacing Threshold Amplitude: 1.25 V
Lead Channel Pacing Threshold Amplitude: 1.25 V
Lead Channel Pacing Threshold Pulse Width: 0.5 ms
Lead Channel Pacing Threshold Pulse Width: 1 ms
Lead Channel Sensing Intrinsic Amplitude: 11.8 mV
Lead Channel Sensing Intrinsic Amplitude: 3.9 mV
Lead Channel Setting Pacing Amplitude: 2.5 V
Lead Channel Setting Pacing Amplitude: 2.5 V
Lead Channel Setting Pacing Pulse Width: 0.5 ms
Lead Channel Setting Sensing Sensitivity: 2 mV
Pulse Gen Model: 2210
Pulse Gen Serial Number: 7364115

## 2022-03-07 ENCOUNTER — Telehealth: Payer: Self-pay | Admitting: Internal Medicine

## 2022-03-07 MED ORDER — SACUBITRIL-VALSARTAN 97-103 MG PO TABS: 1.0000 | ORAL_TABLET | Freq: Two times a day (BID) | ORAL | 1 refills | Status: DC

## 2022-03-07 NOTE — Telephone Encounter (Signed)
*  STAT* If patient is at the pharmacy, call can be transferred to refill team.   1. Which medications need to be refilled? (please list name of each medication and dose if known) sacubitril-valsartan (ENTRESTO) 97-103 MG  2. Which pharmacy/location (including street and city if local pharmacy) is medication to be sent to? [facility] - [place], [place] - [address]  3. Do they need a 30 day or 90 day supply? 90

## 2022-03-07 NOTE — Telephone Encounter (Signed)
Rx sent to pharmacy as requested.

## 2022-03-09 ENCOUNTER — Ambulatory Visit (INDEPENDENT_AMBULATORY_CARE_PROVIDER_SITE_OTHER): Payer: Medicare Other

## 2022-03-09 DIAGNOSIS — Z7901 Long term (current) use of anticoagulants: Secondary | ICD-10-CM

## 2022-03-09 LAB — POCT INR: INR: 2.6 (ref 2.0–3.0)

## 2022-03-09 NOTE — Progress Notes (Signed)
Pt reports he has started drinking cranberry juice. Advised this will increase risk of bleeding. Adjusted dosing to cover.  Continue to take 1/2 tablet daily except take 1 tablet on Mondays, Wednesdays and Fridays. Recheck in 4 weeks.

## 2022-03-09 NOTE — Patient Instructions (Addendum)
Pre visit review using our clinic review tool, if applicable. No additional management support is needed unless otherwise documented below in the visit note.  Continue to take 1/2 tablet daily except take 1 tablet on Mondays, Wednesdays and Fridays. Recheck in 4 weeks. 

## 2022-03-13 ENCOUNTER — Telehealth: Payer: Self-pay | Admitting: Internal Medicine

## 2022-03-13 NOTE — Telephone Encounter (Signed)
Left a message to call back.

## 2022-03-13 NOTE — Telephone Encounter (Signed)
Patient's wife states it has been a while since the patient has had lab work. She would like orders be put in so he can have it done the same day has his appointment 10/23

## 2022-03-14 NOTE — Telephone Encounter (Signed)
Follow Up:      Patient is  's wife is returning Shamea's call from yesterday.

## 2022-03-14 NOTE — Telephone Encounter (Signed)
Spouse called in to discuss pt has not had lab work since Feb.  Wonders if pt needs blood work drawn.  Advised that MD will order labs at next OV on 04/24/22.

## 2022-03-14 NOTE — Telephone Encounter (Signed)
Left a message to call back.

## 2022-03-23 NOTE — Progress Notes (Signed)
Remote pacemaker transmission.   

## 2022-04-03 ENCOUNTER — Ambulatory Visit (INDEPENDENT_AMBULATORY_CARE_PROVIDER_SITE_OTHER): Payer: Medicare Other

## 2022-04-03 DIAGNOSIS — I428 Other cardiomyopathies: Secondary | ICD-10-CM

## 2022-04-03 LAB — CUP PACEART REMOTE DEVICE CHECK
Battery Remaining Longevity: 1 mo
Battery Remaining Percentage: 0.5 %
Battery Voltage: 2.62 V
Brady Statistic AP VP Percent: 1 %
Brady Statistic AP VS Percent: 32 %
Brady Statistic AS VP Percent: 1 %
Brady Statistic AS VS Percent: 66 %
Brady Statistic RA Percent Paced: 30 %
Brady Statistic RV Percent Paced: 1 %
Date Time Interrogation Session: 20231001032408
Implantable Lead Implant Date: 19930223
Implantable Lead Implant Date: 19930223
Implantable Lead Location: 753859
Implantable Lead Location: 753860
Implantable Pulse Generator Implant Date: 20130725
Lead Channel Impedance Value: 450 Ohm
Lead Channel Impedance Value: 450 Ohm
Lead Channel Pacing Threshold Amplitude: 1.25 V
Lead Channel Pacing Threshold Amplitude: 1.25 V
Lead Channel Pacing Threshold Pulse Width: 0.5 ms
Lead Channel Pacing Threshold Pulse Width: 1 ms
Lead Channel Sensing Intrinsic Amplitude: 11 mV
Lead Channel Sensing Intrinsic Amplitude: 3.6 mV
Lead Channel Setting Pacing Amplitude: 2.5 V
Lead Channel Setting Pacing Amplitude: 2.5 V
Lead Channel Setting Pacing Pulse Width: 0.5 ms
Lead Channel Setting Sensing Sensitivity: 2 mV
Pulse Gen Model: 2210
Pulse Gen Serial Number: 7364115

## 2022-04-06 ENCOUNTER — Ambulatory Visit (INDEPENDENT_AMBULATORY_CARE_PROVIDER_SITE_OTHER): Payer: Medicare Other

## 2022-04-06 DIAGNOSIS — Z7901 Long term (current) use of anticoagulants: Secondary | ICD-10-CM | POA: Diagnosis not present

## 2022-04-06 LAB — POCT INR: INR: 2.5 (ref 2.0–3.0)

## 2022-04-06 NOTE — Patient Instructions (Signed)
Continue to take 1/2 tablet daily except take 1 tablet on Mondays, Wednesdays and Fridays. Recheck in 6 weeks. 

## 2022-04-06 NOTE — Progress Notes (Signed)
Continue to take 1/2 tablet daily except take 1 tablet on Mondays, Wednesdays and Fridays. Recheck in 6 weeks per pt request.

## 2022-04-10 DIAGNOSIS — N529 Male erectile dysfunction, unspecified: Secondary | ICD-10-CM | POA: Insufficient documentation

## 2022-04-10 DIAGNOSIS — N059 Unspecified nephritic syndrome with unspecified morphologic changes: Secondary | ICD-10-CM | POA: Insufficient documentation

## 2022-04-11 ENCOUNTER — Ambulatory Visit: Payer: Medicare Other | Attending: Internal Medicine | Admitting: Internal Medicine

## 2022-04-11 ENCOUNTER — Encounter: Payer: Self-pay | Admitting: Internal Medicine

## 2022-04-11 VITALS — BP 138/72 | HR 57 | Ht 71.0 in | Wt 180.0 lb

## 2022-04-11 DIAGNOSIS — Z950 Presence of cardiac pacemaker: Secondary | ICD-10-CM

## 2022-04-11 DIAGNOSIS — I428 Other cardiomyopathies: Secondary | ICD-10-CM

## 2022-04-11 DIAGNOSIS — R001 Bradycardia, unspecified: Secondary | ICD-10-CM

## 2022-04-11 NOTE — Patient Instructions (Addendum)
Medication Instructions:  Your physician recommends that you continue on your current medications as directed. Please refer to the Current Medication list given to you today.  *If you need a refill on your cardiac medications before your next appointment, please call your pharmacy*  Lab Work: None ordered.  If you have labs (blood work) drawn today and your tests are completely normal, you will receive your results only by: [place] (if you have MyChart) OR A paper copy in the mail If you have any lab test that is abnormal or we need to change your treatment, we will call you to review the results.  Testing/Procedures: None ordered.  Follow-Up: At [facility], you and your health needs are our priority.  As part of our continuing mission to provide you with exceptional heart care, we have created designated Provider Care Teams.  These Care Teams include your primary Cardiologist (physician) and Advanced Practice Providers (APPs -  Physician Assistants and Nurse Practitioners) who all work together to provide you with the care you need, when you need it.  We recommend signing up for the patient portal called "MyChart".  Sign up information is provided on this After Visit Summary.  MyChart is used to connect with patients for Virtual Visits (Telemedicine).  Patients are able to view lab/test results, encounter notes, upcoming appointments, etc.  Non-urgent messages can be sent to your provider as well.   To learn more about what you can do with MyChart, go to [email or web address].    Your next appointment:   When your device reaches ERI, you will be contacted to schedule a pacemaker generator change.    The format for your next appointment:   In Person  Provider:   Cristopher Peru, MD{or one of the following Advanced Practice Providers on your designated Care Team:   Tommye Standard, [place] Legrand Como "Jonni Sanger" Chalmers Cater, [place]  Important Information About Sugar

## 2022-04-11 NOTE — Progress Notes (Signed)
        HPI [name] is a very pleasant 81 yo man with a h/o symptomatic brady s/p PPM insertion. He is approaching ERI. He has not had chest pain or sob. He feels well. He remains active. No syncope. 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 His EF is 30-35%.       Allergies  Allergen Reactions   Coreg [Carvedilol] Rash   Lisinopril Rash   Tessalon [Benzonatate] Other (See Comments)      abd pain              Current Outpatient Medications  Medication Sig Dispense Refill   acetaminophen (TYLENOL) 500 MG tablet Take 500-1,000 mg by mouth every 6 (six) hours as needed (for pain.).       alum & mag hydroxide-simeth (MAALOX/MYLANTA) 200-200-20 MG/5ML suspension Take 15 mLs by mouth every 6 (six) hours as needed for indigestion or heartburn.       loperamide (IMODIUM) 2 MG capsule Take 1 capsule (2 mg total) by mouth 4 (four) times daily as needed for diarrhea or loose stools.       loratadine (CLARITIN) 10 MG tablet Take 1 tablet (10 mg total) by mouth daily as needed for allergies.       metoprolol succinate (TOPROL-XL) 50 MG 24 hr tablet Take 1 tablet by mouth once daily 90 tablet 2   Misc Natural Products (IMMUNE FORMULA PO) Take 2 tablets by mouth in the morning. Vitafusion Triple Immune Power Gummy Vitamins       omeprazole (PRILOSEC) 40 MG capsule Take 1 capsule (40 mg total) by mouth daily. Take daily for 3 days then as needed (Patient taking differently: Take 40 mg by mouth daily as needed (indigestion/stomach pain.).) 30 capsule 3   sacubitril-valsartan (ENTRESTO) 97-103 MG Take 1 tablet by mouth 2 (two) times daily. 180 tablet 1   warfarin (COUMADIN) 5 MG tablet TAKE 1 TABLET BY MOUTH DAILY EXCEPT TAKE 1/2 TABLET ON SUNDAYS, TUESDAYS AND THURSDAYS OR AS DIRECTED BY ANTICOAGULATION CLINIC 110 tablet 1    No current facility-administered medications for this visit.            Past Medical History:  Diagnosis Date   CAP (community acquired  pneumonia) 07/25/2017   Cataract      left eye   CHF (congestive heart failure) (HCC)     Complete heart block (HCC)      a. 1993 s/p PPM 2/2 perioperative heart block;  b. 01/2012 s/p SJM Accent DC PPM upgrade.   Depression     GERD (gastroesophageal reflux disease)     History of nephritis      a. as child.   History of pneumonia     Hyperlipidemia     Hypertension     NICM (nonischemic cardiomyopathy) (HCC)      a. 06/2012 Echo: EF 45-50%;  b. 08/2013 Echo: EF 20-25%;  c. 10/2013 Adenosine MV: Fixed septal defect likely representing LBBB-related artifact, no reversible ischemia, EF 43%, low risk.   Paroxysmal atrial tachycardia      a. 07/2013 noted on device interrogation.   Presence of permanent cardiac pacemaker 2013   RA (rheumatoid arthritis) (HCC)     S/P aortic valve replacement      a. 1993 s/p SJM mechanical AVR for aortic stenosis - chronic coumadin.   Shortness of breath dyspnea        ROS:    All systems reviewed and   negative except as noted in the HPI.          Past Surgical History:  Procedure Laterality Date   ANAL FISSURE REPAIR   1970s   AORTIC VALVE REPLACEMENT   1993    [employer] Jude Mechanical Valve   CATARACT EXTRACTION, BILATERAL Bilateral 02/2020    1 wk apart   INSERT / REPLACE / REMOVE PACEMAKER       PACEMAKER INSERTION   1993    for AV block, not pacemaker dependant   PERMANENT PACEMAKER GENERATOR CHANGE N/A 01/25/2012    Procedure: PERMANENT PACEMAKER GENERATOR CHANGE;  Surgeon: James Allred, MD;  Location: [facility] CATH LAB;  Service: Cardiovascular;  Laterality: N/A;   TEE WITHOUT CARDIOVERSION N/A 08/08/2021    Procedure: TRANSESOPHAGEAL ECHOCARDIOGRAM (TEE);  Surgeon: Chandrasekhar, Mahesh A, MD;  Location: [facility] ENDOSCOPY;  Service: Cardiovascular;  Laterality: N/A;  TAVR CT BEFORE TEE             Family History  Problem Relation Age of Onset   Arthritis Mother     Hypertension Mother     Arthritis Father     Heart disease Father     Prostate cancer  Father 90   Hypertension Father     Congestive Heart Failure Father     Coronary artery disease Neg Hx     Stroke Neg Hx     Diabetes Neg Hx          Social History         Socioeconomic History   Marital status: Married      Spouse name: Not on file   Number of children: 1   Years of education: Not on file   Highest education level: Not on file  Occupational History   Occupation: retired  Tobacco Use   Smoking status: Never   Smokeless tobacco: Never  Vaping Use   Vaping Use: Never used  Substance and Sexual Activity   Alcohol use: No   Drug use: No   Sexual activity: Not Currently  Other Topics Concern   Not on file  Social History Narrative    Lives with wife, 5 dogs.  grown children away.    Occupation: retired, was car mechanic    Activity: welding    Diet: low sodium diet, good water, fruits/vegetables daily    Social Determinants of Health    Financial Resource Strain: Not on file  Food Insecurity: Not on file  Transportation Needs: Not on file  Physical Activity: Not on file  Stress: Not on file  Social Connections: Not on file  Intimate Partner Violence: Not on file        BP 138/72   Pulse (!) 57   Ht 5' 11" (1.803 m)   Wt 180 lb (81.6 kg)   SpO2 96%   BMI 25.10 kg/m    Physical Exam:   Well appearing NAD HEENT: Unremarkable Neck:  No JVD, no thyromegally Lymphatics:  No adenopathy Back:  No CVA tenderness Lungs:  Clear with no wheezes HEART:  Regular rate rhythm, 3/6 systolic murmurs, no rubs, no clicks Abd:  soft, positive bowel sounds, no organomegally, no rebound, no guarding Ext:  2 plus pulses, no edema, no cyanosis, no clubbing Skin:  No rashes no nodules Neuro:  CN II through XII intact, motor grossly intact   EKG - nsr with LBBB   DEVICE  Normal device function.  See PaceArt for details. Approaching ERI   Assess/Plan:  [facility] - he is

## 2022-04-17 NOTE — Progress Notes (Signed)
Remote pacemaker transmission.   

## 2022-04-24 ENCOUNTER — Ambulatory Visit: Payer: Medicare Other | Admitting: Internal Medicine

## 2022-05-01 ENCOUNTER — Ambulatory Visit: Payer: Medicare Other | Attending: Internal Medicine | Admitting: Internal Medicine

## 2022-05-01 ENCOUNTER — Encounter: Payer: Self-pay | Admitting: Internal Medicine

## 2022-05-01 VITALS — BP 142/78 | HR 60 | Ht 71.5 in | Wt 180.0 lb

## 2022-05-01 DIAGNOSIS — R001 Bradycardia, unspecified: Secondary | ICD-10-CM | POA: Diagnosis not present

## 2022-05-01 DIAGNOSIS — I340 Nonrheumatic mitral (valve) insufficiency: Secondary | ICD-10-CM | POA: Diagnosis not present

## 2022-05-01 DIAGNOSIS — T8201XA Breakdown (mechanical) of heart valve prosthesis, initial encounter: Secondary | ICD-10-CM

## 2022-05-01 DIAGNOSIS — T8201XD Breakdown (mechanical) of heart valve prosthesis, subsequent encounter: Secondary | ICD-10-CM | POA: Diagnosis not present

## 2022-05-01 DIAGNOSIS — Z952 Presence of prosthetic heart valve: Secondary | ICD-10-CM

## 2022-05-01 DIAGNOSIS — N1831 Chronic kidney disease, stage 3a: Secondary | ICD-10-CM

## 2022-05-01 NOTE — Progress Notes (Signed)
Cardiology Office Note:    Date:  05/01/2022   ID:  Noah Thompson, DOB 1940-11-10, MRN [id number]  PCP:  [name], MD   [facility] Providers Cardiologist:  Christell Constant, MD     Referring MD: [name], MD   CC: Mitral and Aortic valve prosthesis  History of Present Illness:    Noah Thompson is a 81 y.o. 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 Jude PPM.   Known LBBB.  Seen 07/12/21. 2023: had evidence of significant MR and prosthetic stenosis.  After evaluation moderate MR and likely PPM of prosthetic aortic valve. Saw EP with no changes, thought he is approaching ERI.  AT upgrade me may get CRT lead.  Patient notes that he is doing well.   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 There are no interval hospital/ED visit.    No chest pain or pressure .  No SOB/DOE and no PND/Orthopnea.  No weight gain or leg swelling.  No palpitations or syncope .  Past Medical History:  Diagnosis Date   CAP (community acquired pneumonia) 07/25/2017   Cataract    left eye   CHF (congestive heart failure) (HCC)    Complete heart block (HCC)    a. 1993 s/p PPM 2/2 perioperative heart block;  b. 01/2012 s/p SJM Accent DC PPM upgrade.   Depression    GERD (gastroesophageal reflux disease)    History of nephritis    a. as child.   History of pneumonia    Hyperlipidemia    Hypertension    NICM (nonischemic cardiomyopathy) (HCC)    a. 06/2012 Echo: EF 45-50%;  b. 08/2013 Echo: EF 20-25%;  c. 10/2013 Adenosine MV: Fixed septal defect likely representing LBBB-related artifact, no reversible ischemia, EF 43%, low risk.   Paroxysmal atrial tachycardia    a. 07/2013 noted on device interrogation.   Presence of permanent cardiac pacemaker 2013   RA (rheumatoid arthritis) (HCC)    S/P aortic valve replacement    a. 1993 s/p SJM mechanical AVR for  aortic stenosis - chronic coumadin.   Shortness of breath dyspnea     Past Surgical History:  Procedure Laterality Date   ANAL FISSURE REPAIR  1970s   AORTIC VALVE REPLACEMENT  1993   St. Jude Mechanical Valve   CATARACT EXTRACTION, BILATERAL Bilateral 02/2020   1 wk apart   INSERT / REPLACE / REMOVE PACEMAKER     PACEMAKER INSERTION  1993   for AV block, not pacemaker dependant   PERMANENT PACEMAKER GENERATOR CHANGE N/A 01/25/2012   Procedure: PERMANENT PACEMAKER GENERATOR CHANGE;  Surgeon: [name], MD;  Location: [facility] CATH LAB;  Service: Cardiovascular;  Laterality: N/A;   TEE WITHOUT CARDIOVERSION N/A 08/08/2021   Procedure: TRANSESOPHAGEAL ECHOCARDIOGRAM (TEE);  Surgeon: Christell Constant, MD;  Location: [facility] ENDOSCOPY;  Service: Cardiovascular;  Laterality: N/A;  TAVR CT BEFORE TEE    Current Medications: Current Meds  Medication Sig   acetaminophen (TYLENOL) 500 MG tablet Take 500-1,000 mg by mouth every 6 (six) hours as needed (for pain.).   alum & mag hydroxide-simeth (MAALOX/MYLANTA) 200-200-20 MG/5ML suspension Take 15 mLs by mouth every 6 (six) hours as needed for indigestion or heartburn.   loperamide (IMODIUM) 2 MG capsule Take 1 capsule (2 mg total) by mouth 4 (four) times daily as needed for diarrhea or loose stools.  loratadine (CLARITIN) 10 MG tablet Take 1 tablet (10 mg total) by mouth daily as needed for allergies.   metoprolol succinate (TOPROL-XL) 50 MG 24 hr tablet Take 1 tablet by mouth once daily   Misc Natural Products (IMMUNE FORMULA PO) Take 2 tablets by mouth in the morning. Vitafusion Triple Immune Power Gummy Vitamins   omeprazole (PRILOSEC) 40 MG capsule Take 1 capsule (40 mg total) by mouth daily. Take daily for 3 days then as needed (Patient taking differently: Take 40 mg by mouth daily as needed (indigestion/stomach pain.).)   sacubitril-valsartan (ENTRESTO) 97-103 MG Take 1 tablet by mouth 2 (two) times daily.   warfarin (COUMADIN) 5 MG tablet  TAKE 1 TABLET BY MOUTH DAILY EXCEPT TAKE 1/2 TABLET ON  bpm. Exam Location:  [address]  Procedure: 2D Echo, Cardiac Doppler and Color Doppler  Indications:  I42.8 Non ischemic cardiomyopathy  History:        Patient has prior history of Echocardiogram examinations, most recent 12/02/2019. Pacemaker, Non ischemic cardiomyopathy, Signs/Symptoms:AVR (mechanical); Risk Factors:Hypertension and Dyslipidemia.  Sonographer:    Samule Ohm RDCS Referring Phys: [id number] RENEE LYNN URSUY   Sonographer Comments: Technically difficult study due to poor echo windows. Image acquisition challenging due to respiratory motion. IMPRESSIONS   1. There is a mechanical aortic valve prosthesis present (unclear surgical details). 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 Vmax 3.9 m/s, MG, 33 mmHG, EOA 0.63 cm2, DI 0.16. The AT is prolonged ~181msec. There is trivial AI which is likely washing jets (normal finding). All other findings are suggestive of severe prosthetic valve stenosis. Recent INRs are within limits. Would recommend a cardiac CT or TEE for clarification of valve dysfunction. The aortic valve has been repaired/replaced. Aortic valve regurgitation is trivial. Severe aortic valve stenosis. 2. Left ventricular ejection fraction, by estimation, is 30 to 35%. The left ventricle has moderately decreased function. The left ventricle demonstrates global hypokinesis. Left ventricular diastolic parameters are consistent with Grade II diastolic dysfunction (pseudonormalization). Elevated left ventricular end-diastolic pressure. The E/e' is 25.3. 3. Right ventricular systolic function is mildly reduced. The right ventricular size is moderately enlarged. There is normal pulmonary artery systolic pressure. 4. The mitral valve is grossly normal. Moderate  to severe mitral valve regurgitation. No evidence of mitral stenosis. 5. Aneurysm of the ascending aorta, measuring 47 mm. 6. The inferior vena cava is normal in size with greater than 50% respiratory variability, suggesting right atrial pressure of 3 mmHg.  Comparison(s): Changes from prior study are noted. EF 30-35%. Concerns for severe prosthetic valve stenosis.  FINDINGS Left Ventricle: Left ventricular ejection fraction, by estimation, is 30 to 35%. The left ventricle has moderately decreased function. The left ventricle demonstrates global hypokinesis. The left ventricular internal cavity size was normal in size. There is no left ventricular hypertrophy. Left ventricular diastolic parameters are consistent with Grade II diastolic dysfunction (pseudonormalization). Elevated left ventricular end-diastolic pressure. The E/e' is 25.3.  Right Ventricle: The right ventricular size is moderately enlarged. No increase in right ventricular wall thickness. Right ventricular systolic function is mildly reduced. There is normal pulmonary artery systolic pressure. The tricuspid regurgitant velocity is 2.47 m/s, and with an assumed right atrial pressure of 3 mmHg, the estimated right ventricular systolic pressure is 27.4 mmHg.  Left Atrium: Left atrial size was normal in size.  Right Atrium: Right atrial size was normal in size.  Pericardium: There is no evidence of pericardial effusion.  Mitral Valve: The mitral valve is grossly normal. Moderate to severe mitral valve regurgitation. No evidence of mitral valve stenosis.  Tricuspid Valve: The tricuspid valve is grossly normal. Tricuspid valve regurgitation is trivial. No evidence of tricuspid stenosis.  Aortic Valve: There is a mechanical aortic valve prosthesis present (unclear surgical details). 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 Vmax 3.9 m/s, MG, 33 mmHG, EOA 0.63 cm2, DI 0.16. The AT is prolonged ~133msec. There is trivial AI which is  likely washing jets (normal finding). All other findings are suggestive of severe prosthetic valve stenosis. Recent INRs are within limits. Would recommend a cardiac CT or TEE for clarification of valve dysfunction. The aortic valve has been repaired/replaced. Aortic valve regurgitation is trivial. Severe aortic stenosis is present. Aortic valve mean gradient measures 33.0 mmHg. Aortic valve peak gradient measures 60.8 mmHg. Aortic valve area, by VTI measures 0.63 cm. There is a mechanical valve present in the  aortic position.  Pulmonic Valve: The pulmonic valve was grossly normal. Pulmonic valve regurgitation is trivial. No evidence of pulmonic stenosis.  Aorta: The aortic root is normal in size and structure. There is an aneurysm involving the ascending aorta measuring 47 mm.  Venous: The inferior vena cava is normal in size with greater than 50% respiratory variability, suggesting right atrial pressure of 3 mmHg.  IAS/Shunts: The atrial septum is grossly normal.  Additional Comments: A device lead is visualized in the right atrium and right ventricle. There is a small pleural effusion in the left lateral region.   LEFT VENTRICLE PLAX 2D LVIDd:         5.50 cm   Diastology LVIDs:         4.50 cm   LV e' medial:    4.82 cm/s LV PW:         0.90 cm   LV E/e' medial:  24.9 LV IVS:        1.10 cm   LV e' lateral:   6.27 cm/s LVOT diam:     2.20 cm   LV E/e' lateral: 19.1 LV SV:         57 LV SV Index:   28 LVOT Area:     3.80 cm   RIGHT VENTRICLE             IVC RV S prime:     12.20 cm/s  IVC diam: 1.40 cm TAPSE (M-mode): 1.7 cm RVSP:           27.4 mmHg  LEFT ATRIUM             Index        RIGHT ATRIUM           Index LA diam:        3.30 cm 1.63 cm/m   RA Pressure: 3.00 mmHg LA Vol (A2C):   67.1 ml 33.04 ml/m  RA Area:     16.50 cm LA Vol (A4C):   50.3 ml 24.77 ml/m  RA Volume:   44.70 ml  22.01 ml/m LA Biplane Vol: 61.5 ml 30.29 ml/m AORTIC VALVE AV Area (Vmax):     0.61 cm AV Area (Vmean):   0.68 cm AV Area (VTI):     0.63 cm AV Vmax:           390.00 cm/s AV Vmean:          252.600 cm/s AV VTI:            0.911 m AV Peak Grad:      60.8 mmHg AV Mean Grad:      33.0 mmHg LVOT Vmax:         62.50 cm/s LVOT Vmean:        45.400 cm/s LVOT VTI:          0.150 m LVOT/AV VTI ratio: 0.16  AORTA Ao Root diam: 3.60 cm Ao Asc diam:  4.70 cm  MITRAL VALVE                TRICUSPID VALVE MV Area (PHT): 2.26 cm     TR Peak grad:   24.4 mmHg MV Decel Time: 336 msec     TR Vmax:        247.00 cm/s MV E velocity: 120.00 cm/s  Estimated RAP:  3.00 mmHg MV A velocity: 105.22 cm/s  RVSP:           27.4 mmHg MV E/A ratio:  1.14 SHUNTS  Systemic VTI:  0.15 m Systemic Diam: 2.20 cm  Lennie Odor MD Electronically signed by Lennie Odor MD Signature Date/Time: 04/19/2021/8:50:13 PM    Final   TEE  ECHO TEE 08/08/2021  Narrative TRANSESOPHOGEAL ECHO REPORT    Patient Name:   Noah Thompson Date of Exam: 08/08/2021 Medical Rec #:  [id number]       Height:       71.0 in Accession #:    [id number]      Weight:       170.0 lb Date of Birth:  03-22-1941       BSA:          1.968 m Patient Age:    80 years        BP:           151/90 mmHg Patient Gender: M               HR:           58 bpm. Exam Location:  Inpatient  Procedure: Transesophageal Echo, 3D Echo, Color Doppler and Cardiac Doppler  Indications:    Mitral Regurgitation i34.0  History:        Patient has prior history of Echocardiogram examinations, most recent 04/19/2021. Pacemaker; Risk Factors:Hypertension and Dyslipidemia.  Sonographer:    Irving Burton Senior RDCS Referring Phys: [id number] [facility] A Maryjayne Kleven  PROCEDURE: After discussion of the risks and benefits of a TEE, an informed consent was obtained from the patient. The transesophogeal probe was passed without difficulty through the esophogus of the patient. Sedation performed by different physician. The patient was monitored  while under deep sedation. Anesthestetic sedation was provided intravenously by Anesthesiology: 174mg  of Propofol. The patient developed no complications during the procedure.  IMPRESSIONS   1. The aortic valve has been replaced by a St. Jude Mechanical Aortic Valve. Aortic valve regurgitation is trivial. Effective orifice area, by VTI measures 0.73 cm. Aortic valve mean gradient measures 26.0 mmHg. Aortic valve Vmax measures 3.51 m/s. DVI 0.19. Acceleration time 112 ms. Severe prosthetic dysfunction noted. 2. Left ventricular ejection fraction, by estimation, is 30 to 35%. The left ventricle has moderately decreased function. The left ventricle has no regional wall motion abnormalities. 3. Right ventricular systolic function is low normal. The right ventricular size is mildly enlarged. 4. No left atrial/left atrial appendage thrombus was detected. 5. The mitral valve is grossly normal. Moderate mitral valve regurgitation. No evidence of mitral stenosis. Pulmonary vein blunting without reversal. VCA of largest MR jet 0.21 cm2. 6. Aortic dilatation noted. There is moderate dilatation of the ascending aorta, measuring 49 mm.  Conclusion(s)/Recommendation(s): Will send to heart team for multi-disciplinary discussion.  FINDINGS Left Ventricle: Left ventricular ejection fraction, by estimation, is 30 to 35%. The left ventricle has moderately decreased function. The left ventricle has no regional wall motion abnormalities. The left ventricular internal cavity size was normal in size.  Right Ventricle: The right ventricular size is mildly enlarged. Right vetricular wall thickness was not assessed. Right ventricular systolic function is low normal.  Left Atrium: Left atrial size was normal in size. No left atrial/left atrial appendage thrombus was detected.  Right Atrium: Right atrial size was normal in size.  Pericardium: There is no evidence of pericardial effusion.  Mitral Valve: The mitral  valve is grossly normal. Moderate mitral valve regurgitation. No evidence of mitral valve stenosis.  Tricuspid Valve: The tricuspid valve is normal in structure. Tricuspid valve regurgitation is mild . No evidence of tricuspid stenosis.  Aortic Valve: The aortic valve has been repaired/replaced. Aortic valve regurgitation is trivial. Aortic valve mean gradient measures 26.0 mmHg. Aortic valve peak gradient measures 49.3 mmHg. Aortic valve area, by VTI measures 0.73 cm.  Pulmonic Valve: The pulmonic valve was normal in structure. Pulmonic valve regurgitation is not visualized.  Aorta: Aortic dilatation noted and the aortic root is normal in size and structure. There is moderate dilatation of the ascending aorta, measuring 49 mm.  IAS/Shunts: No atrial level shunt detected by color flow Doppler.  Additional Comments: A device lead is visualized.   LEFT VENTRICLE PLAX 2D LVOT diam:     2.10 cm LV SV:         59 LV SV Index:   30 LVOT Area:     3.46 cm   AORTIC VALVE AV Area (Vmax):    0.66 cm AV Area (Vmean):   0.72 cm AV Area (VTI):     0.73 cm AV Vmax:           351.00 cm/s AV Vmean:          239.000 cm/s AV VTI:            0.805 m AV Peak Grad:      49.3 mmHg AV Mean Grad:      26.0 mmHg LVOT Vmax:         66.90 cm/s LVOT Vmean:        50.000 cm/s LVOT VTI:          0.170 m LVOT/AV VTI ratio: 0.21  MR Peak grad:    101.6 mmHg MR Mean grad:    46.0 mmHg    SHUNTS MR Vmax:         504.00 cm/s  Systemic VTI:  0.17 m MR Vmean:        307.0 cm/s   Systemic Diam: 2.10 cm MR PISA:         3.08 cm MR PISA Eff ROA: 24 mm MR PISA Radius:  0.70 cm  Riley Lam MD Electronically signed by Riley Lam MD Signature Date/Time: 08/08/2021/4:53:53 PM    Final    CT SCANS  CT CORONARY MORPH W/CTA COR W/SCORE 08/09/2021  Addendum 08/09/2021  4:01 PM ADDENDUM REPORT: 08/09/2021 15:58  CLINICAL DATA:  Aortic Valve pathology with assessment of  prosthetic valve  EXAM: Cardiac TAVR CT  TECHNIQUE: The patient was scanned on a Siemens Force 192 slice scanner. A 120 kV retrospective scan was triggered in the descending thoracic aorta at 111 HU's. Gantry rotation speed was 270 msecs and collimation was .9 mm. No beta blockade or nitro were given. The 3D data set was reconstructed in 5% intervals of the R-R cycle. Systolic and diastolic phases were analyzed on a dedicated work station using MPR, MIP and VRT modes. The patient received 95 cc of contrast.  FINDINGS: Prosthetic Valve: There is a Clinical research associate Aortic Valve  Opening angle: 13 degrees (normal < 20 degrees) without evidence leaflet restriction.  Closing angle: 120 degrees (normal 120 degrees)  Level of coronary ostia above tilting disks: 9 mm (left), 14 mm (right)  No significant pannus within the valve.  Aortic Annulus Measurements- 20% Phase  Major annulus diameter: 18 mm  Minor annulus diameter:18 mm  Annular perimeter: 56 mm  Annular area: Area 2.41 cm2  Suspect 19 mm St. Jude Bileaflet Valve as the native valve  Aortic Root Measurements  Sinotubular Junction: 38 m  Ascending Thoracic Aorta: 49 mm  Aortic Arch:  39 mm  Descending Thoracic Aorta: 23 mm  Sinus of Valsalva Measurements:  Right coronary cusp width: 32 mm  Left coronary cusp width: 31 mm  Non coronary cusp width: 34 mm  Mean diameter: 34 mm  Non SAVR Valve Findings:  There is a dual chamber pacemaker with leads noted into the SVC.  The atrial lead terminates in the right atrial appendage.  The ventricular lead terminates in the right ventricular apex.  No interatrial shunting.  Normal pulmonary artery size.  Normal pulmonary vein drainage.  Patent left atrial appendage.  Coronary Calcium Score:  Left main: 1  Left anterior descending artery: 65  Left circumflex artery: 140  Right coronary artery: 0  Total: 206  Percentile: 22 th for  age, sex, and race matched control.  IMPRESSION: 1. Mechanical Aortic valve. There is normal opening and closing angle of the prosthesis, with no significant pannus or evidence of pannus.  2. Patient's total coronary artery calcium score is 206, which is 37th percentile for subjects of the same age, gender, and race based populations.  3. 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 Will need 6 month follow up study of aorta.  4. Study consistent with patient prosthesis mismatch.  RECOMMENDATIONS:  Coronary artery calcium (CAC) score is a strong predictor of incident coronary heart disease (CHD) and provides predictive information beyond traditional risk factors. CAC scoring is reasonable to use in the decision to withhold, postpone, or initiate statin therapy in intermediate-risk or selected borderline-risk asymptomatic adults (age 42-75 years and LDL-C >=70 to <190 mg/dL) who do not have diabetes or established atherosclerotic cardiovascular disease (ASCVD).* In intermediate-risk (10-year ASCVD risk >=7.5% to <20%) adults or selected borderline-risk (10-year ASCVD risk >=5% to <7.5%) adults in whom a CAC score is measured for the purpose of making a treatment decision the following recommendations have been made:  If CAC = 0, it is reasonable to withhold statin therapy and reassess in 5 to 10 years, as long as higher risk conditions are absent (diabetes mellitus, family history of premature CHD in first degree relatives (males <55 years; females <65 years), cigarette smoking, LDL >=190 mg/dL or other independent risk factors).  If CAC is 1 to 99, it is reasonable to initiate statin therapy for patients >=105 years of age.  If CAC is >=100 or >=75th percentile, it is reasonable to initiate statin therapy at any age.  Cardiology referral should be considered for patients with CAC scores >=400 or >=75th percentile.  *2018  AHA/ACC/AACVPR/AAPA/ABC/ACPM/ADA/AGS/APhA/ASPC/NLA/PCNA Guideline on the Management of Blood Cholesterol: A Report of the American College of Cardiology/American Heart Association Task Force on Clinical Practice Guidelines. J Am Coll Cardiol. 2019;73(24):3168-3209.  Ezel Vallone   Electronically Signed By: Rudean Haskell M.D. On: 08/09/2021 15:58  Narrative EXAM: OVER-READ INTERPRETATION  CT CHEST  The following report is an over-read performed by radiologist Dr. Vinnie Langton of [facility], [place] on 08/08/2021. This over-read does not include interpretation of cardiac or coronary anatomy or pathology. The coronary calcium score/coronary CTA interpretation by the cardiologist is attached.  COMPARISON:  None.  FINDINGS: Extracardiac findings will be described separately under dictation for contemporaneously obtained CTA chest, abdomen and pelvis.  IMPRESSION: Please see separate dictation for contemporaneously obtained CTA chest, abdomen and pelvis dated 08/08/2021 for full description of relevant extracardiac findings.  Electronically Signed: By: Vinnie Langton M.D. On: 08/08/2021 11:20            Recent Labs: 07/12/2021: NT-Pro BNP 2,232  08/05/2021: BUN 19; Creatinine, Ser 1.19; Hemoglobin 14.4; Platelets 255; Potassium 4.5; Sodium 142  Recent Lipid Panel    Component Value Date/Time   CHOL 222 (H) 05/24/2020 1201   TRIG 174.0 (H) 05/24/2020 1201   HDL 48.70 05/24/2020 1201   CHOLHDL 5 05/24/2020 1201   VLDL 34.8 05/24/2020 1201   LDLCALC 138 (H) 05/24/2020 1201   LDLDIRECT 157.0 02/16/2016 1248    Physical Exam:    VS:  BP (!) 142/78   Pulse 60   Ht 5' 11.5" (1.816 m)   Wt 180 lb (81.6 kg)   SpO2 97%   BMI 24.76 kg/m     Wt Readings from Last 3 Encounters:  05/01/22 180 lb (81.6 kg)  04/11/22 180 lb (81.6 kg)  08/08/21 170 lb (77.1 kg)    Gen: No distress  Neck: No JVD  Cardiac: No Rubs or Gallops, holosystolic Murmur  with prominent crescendo, regular rhythm with +2 radial pulses Respiratory: Clear to auscultation bilaterally, normal effort, normal  respiratory rate GI: Soft, nontender, non-distended  MS: trace right leg edema;  moves all extremities Integument: Skin feels warm Neuro:  At time of evaluation, alert and oriented to person/place/time/situation  Psych: Normal affect, patient feels well  ASSESSMENT:    1. Moderate to severe mitral regurgitation   2. Symptomatic sinus bradycardia   3. Prosthetic aortic valve failure   4. Nonrheumatic mitral valve regurgitation   5. S/P aortic valve replacement   6. Stage 3a chronic kidney disease (HCC)     PLAN:    Severe Prosthetic AS prior St. Jude MVR (INR goal 2.5 +/- 0.5) Mod-Severe MR HFrEF and LBBB S/p St. DC PPM - consideration or CS lead per EP - continue warfarin for prosthetic valve; needs abx before dental work - needs echo in 2024; high bar to repeat testhing for prosthetic dysfunction or MR eval; he would be a candidate for mTEER  - continue Enstersto and succinate - AKI in the past on MRA - I have offered him SGLT2i he decline; we will re-evaluted if worsening weight gain/SOB or after his echo - CBC and BMP pending  TAA - seeing TCTS  Six months      Medication Adjustments/Labs and Tests Ordered: Current medicines are reviewed at length with the patient today.  Concerns regarding medicines are outlined above.  Orders Placed This Encounter  Procedures   CBC   Basic metabolic panel   ECHOCARDIOGRAM COMPLETE   No orders of the defined types were placed in this encounter.   Patient Instructions  Medication Instructions:  Your physician recommends that you continue on your current medications as directed. Please refer to the Current Medication list given to you today.  *If you need a refill on your cardiac medications before your next appointment, please call your pharmacy*   Lab Work: 1 WEEK at [facility]: CBC,  BMP  If you have labs (blood work) drawn today and your tests are completely normal, you will receive your results only by: MyChart Message (if you have MyChart) OR A paper copy in the mail If you have any lab test that is abnormal or we need to change your treatment, we will call you to review the results.   Testing/Procedures: FEB 2024: Your physician has requested that you have an echocardiogram. Echocardiography is a painless test that uses sound waves to create images of your heart. It provides your doctor with information about the size and shape of your heart and how well your  heart's chambers and valves are working. This procedure takes approximately one hour. There are no restrictions for this procedure. Please do NOT wear cologne, perfume, aftershave, or lotions (deodorant is allowed). Please arrive 15 minutes prior to your appointment time.    Follow-Up: At [facility], you and your health needs are our priority.  As part of our continuing mission to provide you with exceptional heart care, we have created designated Provider Care Teams.  These Care Teams include your primary Cardiologist (physician) and Advanced Practice Providers (APPs -  Physician Assistants and Nurse Practitioners) who all work together to provide you with the care you need, when you need it.  We recommend signing up for the patient portal called "MyChart".  Sign up information is provided on this After Visit Summary.  MyChart is used to connect with patients for Virtual Visits (Telemedicine).  Patients are able to view lab/test results, encounter notes, upcoming appointments, etc.  Non-urgent messages can be sent to your provider as well.   To learn more about what you can do with MyChart, go to [email or web address].    Your next appointment:   6 month(s)  The format for your next appointment:   In Person  Provider:   Christell Constant, MD      Important Information About  Sugar         Signed, Christell Constant, MD  05/01/2022 5:40 PM    [facility]

## 2022-05-01 NOTE — Patient Instructions (Signed)
Medication Instructions:  Your physician recommends that you continue on your current medications as directed. Please refer to the Current Medication list given to you today.  *If you need a refill on your cardiac medications before your next appointment, please call your pharmacy*   Lab Work: 1 WEEK at [facility]: CBC, BMP  If you have labs (blood work) drawn today and your tests are completely normal, you will receive your results only by: [place] (if you have [place]) OR A paper copy in the mail If you have any lab test that is abnormal or we need to change your treatment, we will call you to review the results.   Testing/Procedures: FEB 2024: Your physician has requested that you have an echocardiogram. Echocardiography is a painless test that uses sound waves to create images of your heart. It provides your doctor with information about the size and shape of your heart and how well your heart's chambers and valves are working. This procedure takes approximately one hour. There are no restrictions for this procedure. Please do NOT wear cologne, perfume, aftershave, or lotions (deodorant is allowed). Please arrive 15 minutes prior to your appointment time.    Follow-Up: At [facility], you and your health needs are our priority.  As part of our continuing mission to provide you with exceptional heart care, we have created designated Provider Care Teams.  These Care Teams include your primary Cardiologist (physician) and Advanced Practice Providers (APPs -  Physician Assistants and Nurse Practitioners) who all work together to provide you with the care you need, when you need it.  We recommend signing up for the patient portal called "MyChart".  Sign up information is provided on this After Visit Summary.  MyChart is used to connect with patients for Virtual Visits (Telemedicine).  Patients are able to view lab/test results, encounter notes, upcoming appointments, etc.   Non-urgent messages can be sent to your provider as well.   To learn more about what you can do with MyChart, go to [email or web address].    Your next appointment:   6 month(s)  The format for your next appointment:   In Person  Provider:   Werner Lean, MD      Important Information About Sugar

## 2022-05-02 DIAGNOSIS — I340 Nonrheumatic mitral (valve) insufficiency: Secondary | ICD-10-CM | POA: Diagnosis not present

## 2022-05-03 LAB — CUP PACEART REMOTE DEVICE CHECK
Battery Remaining Longevity: 1 mo
Battery Remaining Percentage: 0.5 %
Battery Voltage: 2.6 V
Brady Statistic AP VP Percent: 1 %
Brady Statistic AP VS Percent: 17 %
Brady Statistic AS VP Percent: 1 %
Brady Statistic AS VS Percent: 81 %
Brady Statistic RA Percent Paced: 15 %
Brady Statistic RV Percent Paced: 1 %
Date Time Interrogation Session: 20231101033504
Implantable Lead Connection Status: 753985
Implantable Lead Connection Status: 753985
Implantable Lead Implant Date: 19930223
Implantable Lead Implant Date: 19930223
Implantable Lead Location: 753859
Implantable Lead Location: 753860
Implantable Pulse Generator Implant Date: 20130725
Lead Channel Impedance Value: 450 Ohm
Lead Channel Impedance Value: 460 Ohm
Lead Channel Pacing Threshold Amplitude: 1.25 V
Lead Channel Pacing Threshold Amplitude: 1.25 V
Lead Channel Pacing Threshold Pulse Width: 0.5 ms
Lead Channel Pacing Threshold Pulse Width: 1 ms
Lead Channel Sensing Intrinsic Amplitude: 11 mV
Lead Channel Sensing Intrinsic Amplitude: 3.8 mV
Lead Channel Setting Pacing Amplitude: 2.5 V
Lead Channel Setting Pacing Amplitude: 2.5 V
Lead Channel Setting Pacing Pulse Width: 0.5 ms
Lead Channel Setting Sensing Sensitivity: 2 mV
Pulse Gen Model: 2210
Pulse Gen Serial Number: 7364115

## 2022-05-03 LAB — BASIC METABOLIC PANEL
BUN/Creatinine Ratio: 15 (ref 10–24)
BUN: 19 mg/dL (ref 8–27)
CO2: 26 mmol/L (ref 20–29)
Calcium: 9.2 mg/dL (ref 8.6–10.2)
Chloride: 103 mmol/L (ref 96–106)
Creatinine, Ser: 1.27 mg/dL (ref 0.76–1.27)
Glucose: 95 mg/dL (ref 70–99)
Potassium: 4.5 mmol/L (ref 3.5–5.2)
Sodium: 140 mmol/L (ref 134–144)
eGFR: 57 mL/min/{1.73_m2} — ABNORMAL LOW (ref >59)

## 2022-05-03 LAB — CBC
Hematocrit: 40.6 % (ref 37.5–51.0)
Hemoglobin: 13.1 g/dL (ref 13.0–17.7)
MCH: 29 pg (ref 26.6–33.0)
MCHC: 32.3 g/dL (ref 31.5–35.7)
MCV: 90 fL (ref 79–97)
Platelets: 304 10*3/uL (ref 150–450)
RBC: 4.51 x10E6/uL (ref 4.14–5.80)
RDW: 13 % (ref 11.6–15.4)
WBC: 8.1 10*3/uL (ref 3.4–10.8)

## 2022-05-04 ENCOUNTER — Ambulatory Visit (INDEPENDENT_AMBULATORY_CARE_PROVIDER_SITE_OTHER): Payer: Medicare Other

## 2022-05-04 DIAGNOSIS — I428 Other cardiomyopathies: Secondary | ICD-10-CM

## 2022-05-15 NOTE — Progress Notes (Signed)
Remote pacemaker transmission.   

## 2022-05-16 ENCOUNTER — Telehealth: Payer: Self-pay | Admitting: Family Medicine

## 2022-05-16 NOTE — Telephone Encounter (Signed)
Patient has not seen his PCP in almost two years. Needs to be seen for referral.

## 2022-05-16 NOTE — Telephone Encounter (Signed)
Patient wife called and stated patient needs to see a dermatologist. Call back number [phone].

## 2022-05-18 ENCOUNTER — Ambulatory Visit (INDEPENDENT_AMBULATORY_CARE_PROVIDER_SITE_OTHER): Payer: Medicare Other

## 2022-05-18 DIAGNOSIS — Z7901 Long term (current) use of anticoagulants: Secondary | ICD-10-CM | POA: Diagnosis not present

## 2022-05-18 LAB — POCT INR: INR: 3.9 — AB (ref 2.0–3.0)

## 2022-05-18 NOTE — Patient Instructions (Signed)
Hold today's dose.  Then continue to take 1/2 tablet daily except take 1 tablet on Mondays, Wednesdays and Fridays. Recheck on 12/14 at 2:00.

## 2022-05-18 NOTE — Progress Notes (Signed)
Hold today's dose.  Then continue to take 1/2 tablet daily except take 1 tablet on Mondays, Wednesdays and Fridays. Recheck in 4 weeks per pt request.

## 2022-05-19 ENCOUNTER — Other Ambulatory Visit (HOSPITAL_COMMUNITY): Payer: Medicare Other

## 2022-05-22 IMAGING — CT CT HEART MORP W/ CTA COR W/ SCORE W/ CA W/CM &/OR W/O CM
2 of 7 series · 11 of 20 positions shown, 13 images · IV contrast (APPLIED)
Comparison: None.
COMPARISON: None.

Addendum:
EXAM:
OVER-READ INTERPRETATION  CT CHEST

The following report is an over-read performed by radiologist Dr.
Geetha Babatunde [REDACTED] on 08/08/2021. This
over-read does not include interpretation of cardiac or coronary
anatomy or pathology. The coronary calcium score/coronary CTA
interpretation by the cardiologist is attached.
CLINICAL DATA: Aortic Valve pathology with assessment of prosthetic
valve
Cardiac TAVR CT
TECHNIQUE: The patient was scanned on a Siemens Force [REDACTED]ice scanner. A 120
kV retrospective scan was triggered in the descending thoracic aorta
at 111 HU's. Gantry rotation speed was 270 msecs and collimation was
.9 mm. No beta blockade or nitro were given. The 3D data set was
reconstructed in 5% intervals of the R-R cycle. Systolic and
diastolic phases were analyzed on a dedicated work station using
MPR, MIP and VRT modes. The patient received 95 cc of contrast.

[Series 7: 0-90% · axial · 0.39mm/px · z∈[+1153,+1306]mm · 5 of 3820 slices shown]
[im 637/3820  vessel]
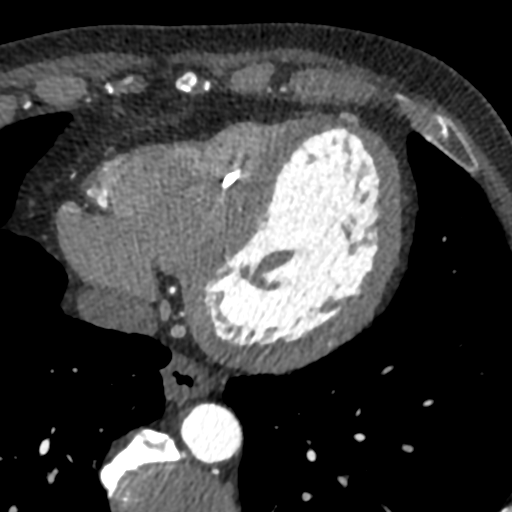
[im 1274/3820  vessel]
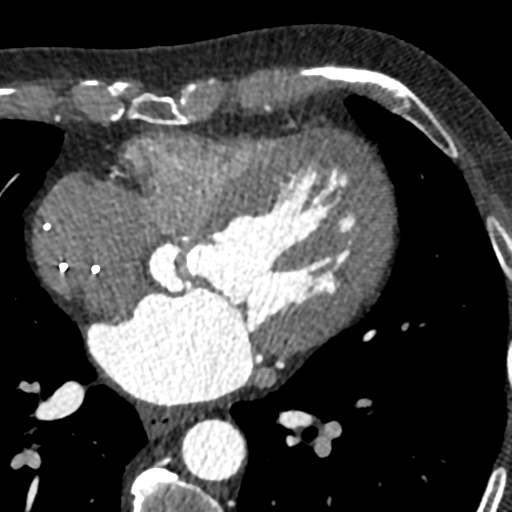
[im 1910/3820  vessel]
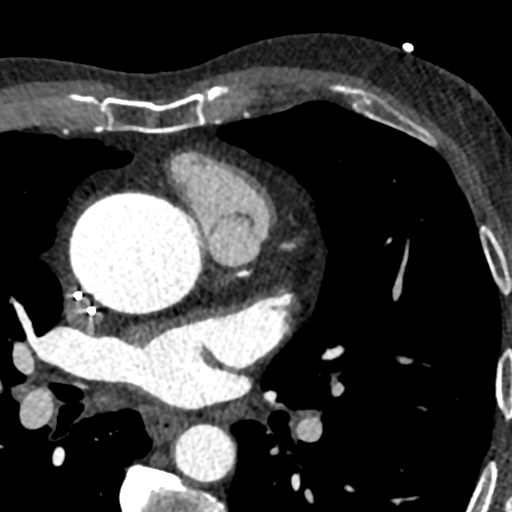
[im 2547/3820  vessel]
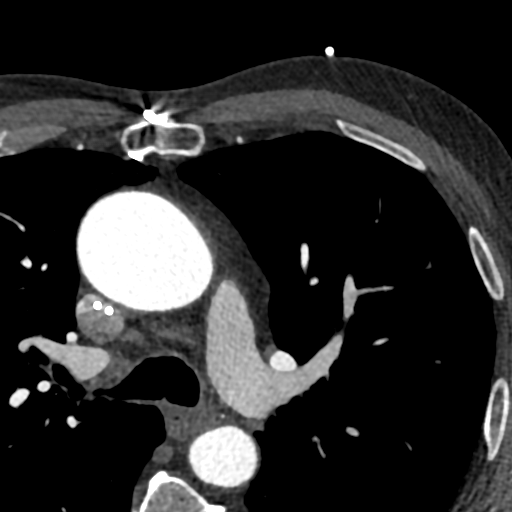
[im 3183/3820  vessel]
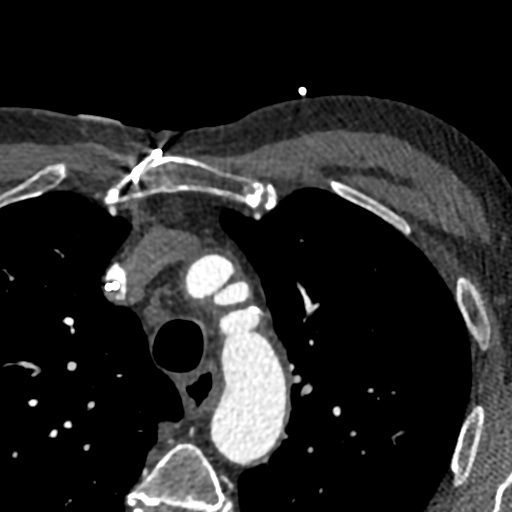

[Series 8: 5-95% · axial · 0.39mm/px · z∈[+1147,+1311]mm · 6 of 3820 slices shown, 8 images]
[im 546/3820  vessel]
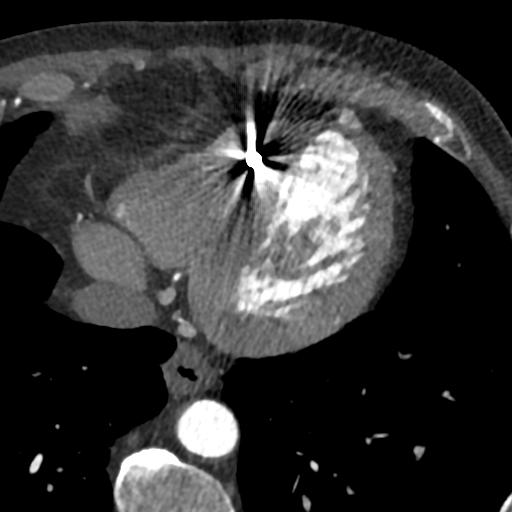
[im 546/3820  lung]
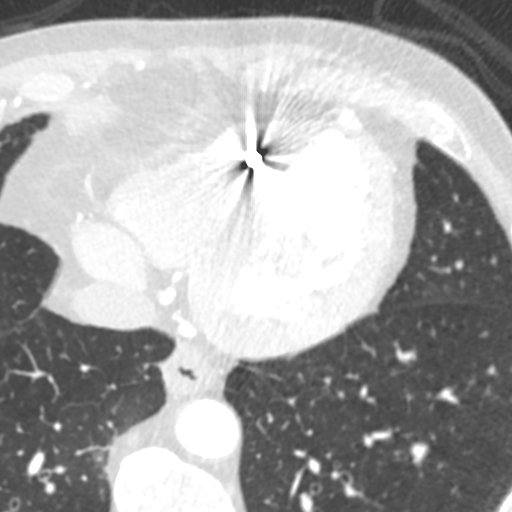
[im 1092/3820  vessel]
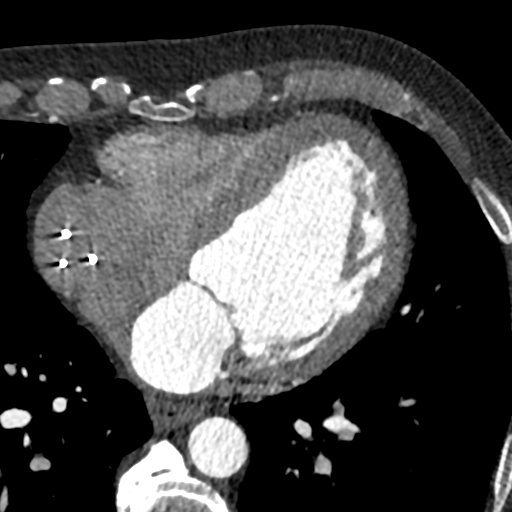
[im 1637/3820  vessel]
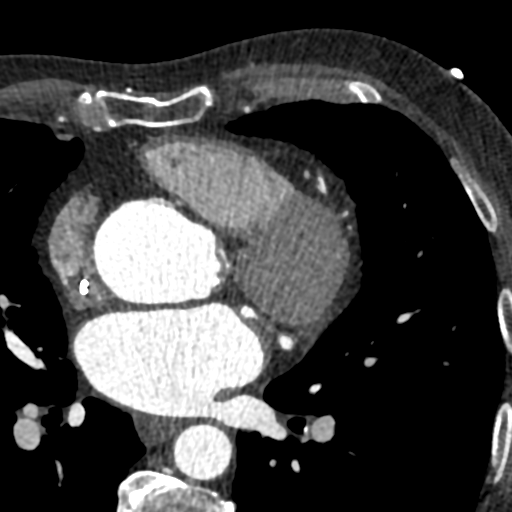
[im 2183/3820  vessel]
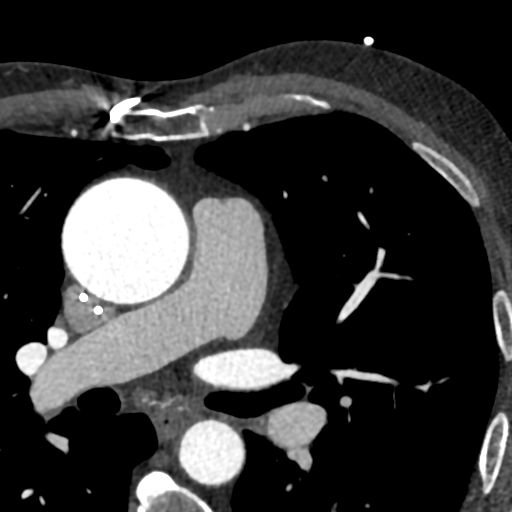
[im 2728/3820  vessel]
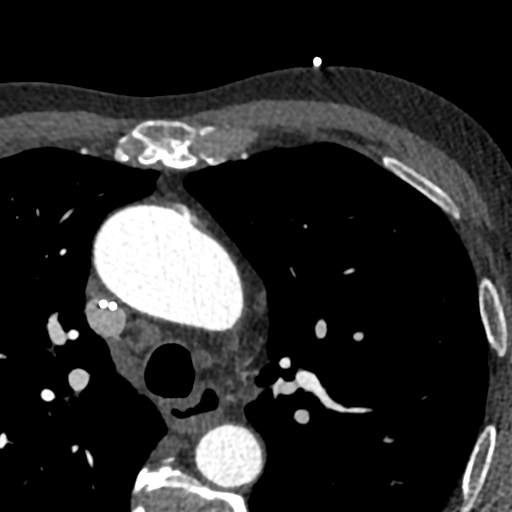
[im 2728/3820  lung]
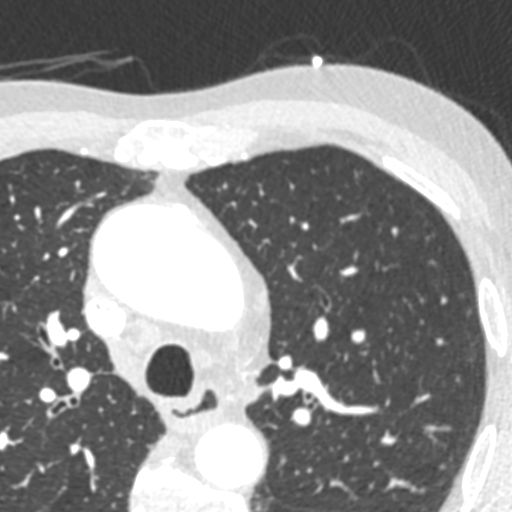
[im 3274/3820  vessel]
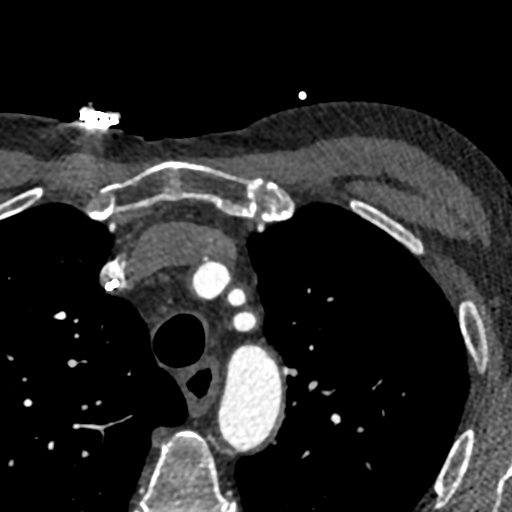

[11 of 20 positions shown; findings below may reference images not displayed]

FINDINGS: Extracardiac findings will be described separately under dictation
for contemporaneously obtained CTA chest, abdomen and pelvis.
IMPRESSION: Please see separate dictation for contemporaneously obtained CTA
chest, abdomen and pelvis dated 08/08/2021 for full description of
relevant extracardiac findings.
FINDINGS: Prosthetic Valve: There is a St. Jude Bileaflet Mechanical Aortic
Valve

Opening angle: 13 degrees (normal < 20 degrees) without evidence
leaflet restriction.

Closing angle: 120 degrees (normal 120 degrees)

Level of coronary ostia above tilting disks: 9 mm (left), 14 mm
(right)

No significant pannus within the valve.

Aortic Annulus Measurements- 20% Phase

Major annulus diameter: 18 mm

Minor annulus diameter:18 mm

Annular perimeter: 56 mm

Annular area: Area 2.41 cm2

Suspect 19 mm St. Jude Bileaflet Valve as the native valve

Aortic Root Measurements

Sinotubular Junction: 38 m

Ascending Thoracic Aorta: 49 mm

Aortic Arch: 39 mm

Descending Thoracic Aorta: 23 mm

Sinus of Valsalva Measurements:

Right coronary cusp width: 32 mm

Left coronary cusp width: 31 mm

Non coronary cusp width: 34 mm

Mean diameter: 34 mm

Non SAVR Valve Findings:

There is a dual chamber pacemaker with leads noted into the SVC.

The atrial lead terminates in the right atrial appendage.

The ventricular lead terminates in the right ventricular apex.

No interatrial shunting.

Normal pulmonary artery size.

Normal pulmonary vein drainage.

Patent left atrial appendage.

Coronary Calcium Score:

Left main: 1

Left anterior descending artery: 65

Left circumflex artery: 140

Right coronary artery: 0

Total: 206

Percentile: 37 th for age, sex, and race matched control.
IMPRESSION: 1. Mechanical Aortic valve. There is normal opening and closing
angle of the prosthesis, with no significant pannus or evidence of
pannus.

2. Patient's total coronary artery calcium score is 206, which is
37th percentile for subjects of the same age, gender, and race based
populations.

3. Moderate to severe thoracic aortic aneurysm (49 mm). Will need 6
month follow up study of aorta.

4. Study consistent with patient prosthesis mismatch.

RECOMMENDATIONS:



If CAC = 0, it is reasonable to withhold statin therapy and reassess
in 5 to 10 years, as long as higher risk conditions are absent
(diabetes mellitus, family history of premature CHD in first degree
relatives (males <55 years; females <65 years), cigarette smoking,
LDL >=190 mg/dL or other independent risk factors).

If CAC is 1 to 99, it is reasonable to initiate statin therapy for
patients >=55 years of age.

If CAC is >=100 or >=75th percentile, it is reasonable to initiate
statin therapy at any age.

Cardiology referral should be considered for patients with CAC
scores >=400 or >=75th percentile.

*8575 AHA/ACC/AACVPR/AAPA/ABC/MAJHOULE/CIMOCH/ANDREI/[place]/POLIN/BENNETH/TIGER
Guideline on the Management of Blood Cholesterol: A Report of the
American College of Cardiology/American Heart Association Task Force
on Clinical Practice Guidelines. J Am Coll Cardiol.
1607;73(24):4689-4037.

Rony Alexander Noruega

*** End of Addendum ***
EXAM:
OVER-READ INTERPRETATION  CT CHEST

The following report is an over-read performed by radiologist Dr.
Geetha Babatunde [REDACTED] on 08/08/2021. This
over-read does not include interpretation of cardiac or coronary
anatomy or pathology. The coronary calcium score/coronary CTA
interpretation by the cardiologist is attached.
FINDINGS: Extracardiac findings will be described separately under dictation
for contemporaneously obtained CTA chest, abdomen and pelvis.
IMPRESSION: Please see separate dictation for contemporaneously obtained CTA
chest, abdomen and pelvis dated 08/08/2021 for full description of
relevant extracardiac findings.

## 2022-05-22 IMAGING — CT CT ANGIO CHEST
2 of 7 series · 15 of 36 positions shown · non-contrast
Comparison: No priors.

CLINICAL DATA: 80-year-old male with history of severe aortic
stenosis. Preprocedural study prior to potential transcatheter
aortic valve replacement (TAVR) procedure.

EXAM:
CT ANGIOGRAPHY CHEST, ABDOMEN AND PELVIS
TECHNIQUE: Multidetector CT imaging through the chest, abdomen and pelvis was
performed using the standard protocol during bolus administration of
intravenous contrast. Multiplanar reconstructed images and MIPs were
obtained and reviewed to evaluate the vascular anatomy.

[Series 4: ax thins · axial · 0.59mm/px · z∈[+769,+1396]mm · 14 of 725 slices shown]
[im 49/725  lung]
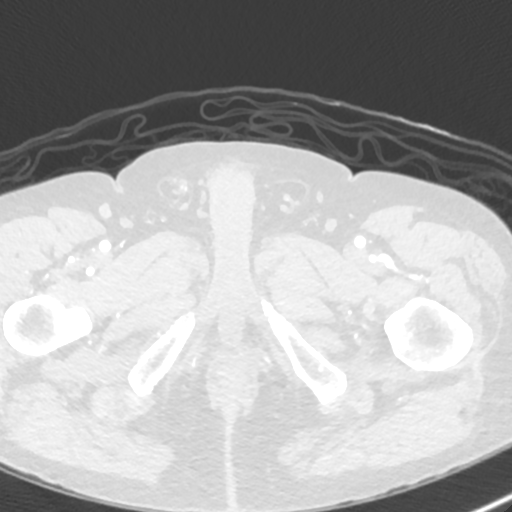
[im 97/725  mediastinal]
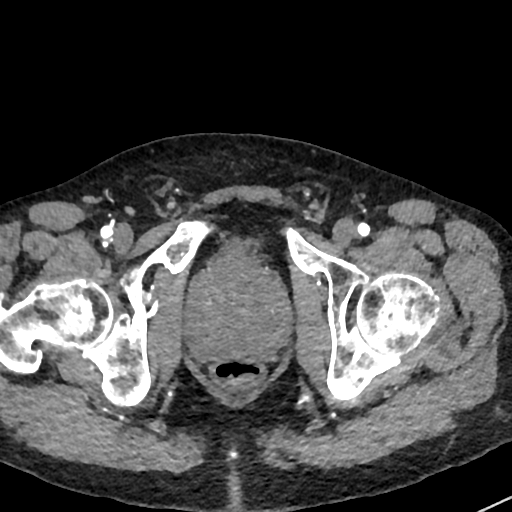
[im 145/725  lung]
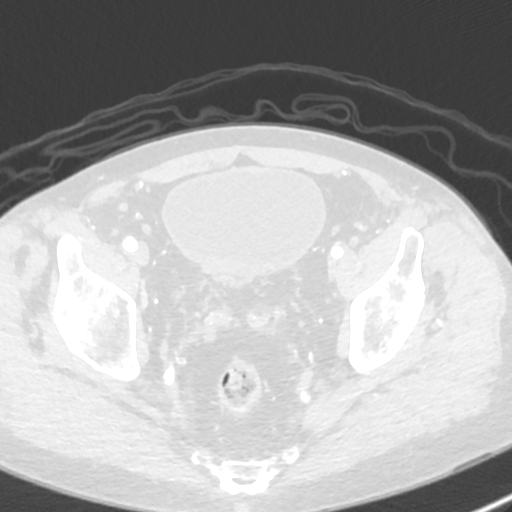
[im 194/725  mediastinal]
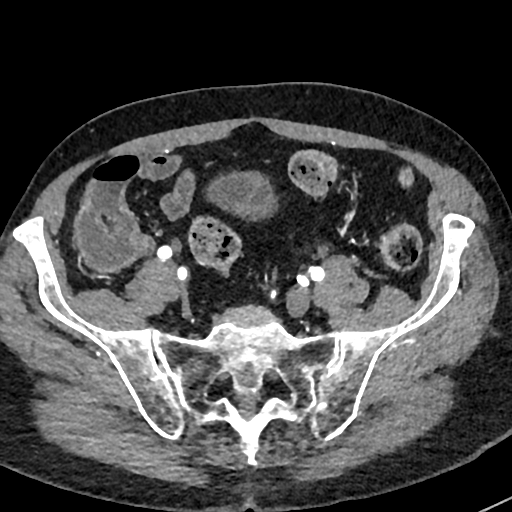
[im 242/725  lung]
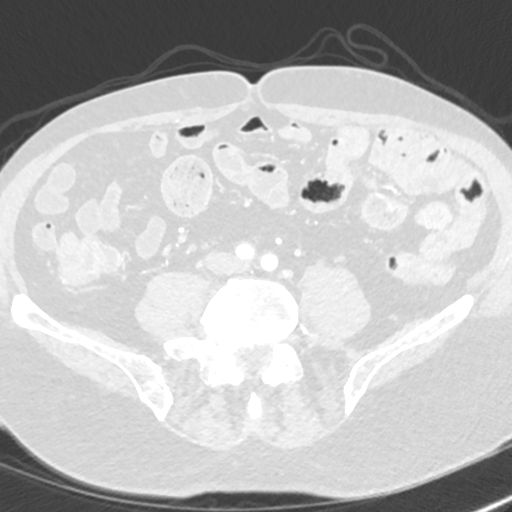
[im 290/725  mediastinal]
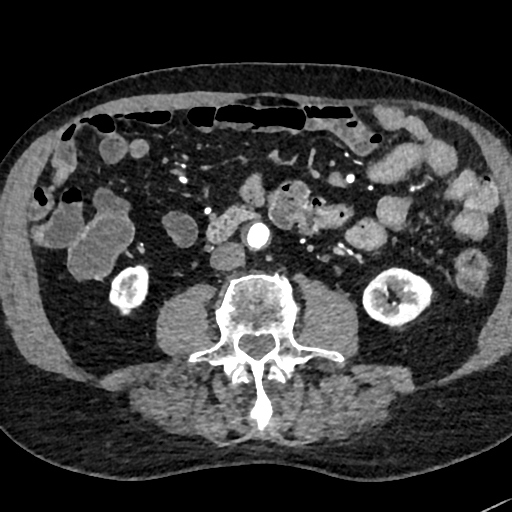
[im 338/725  lung]
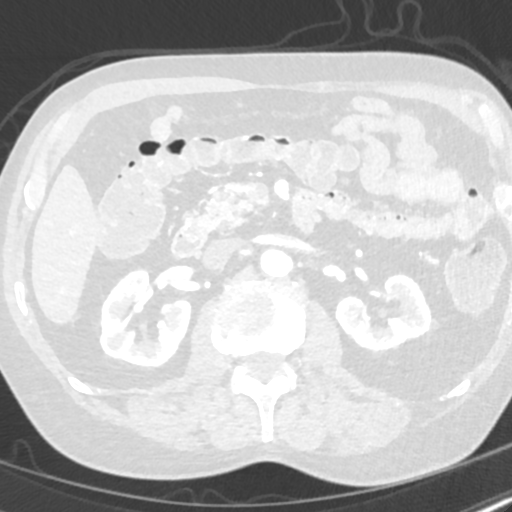
[im 387/725  mediastinal]
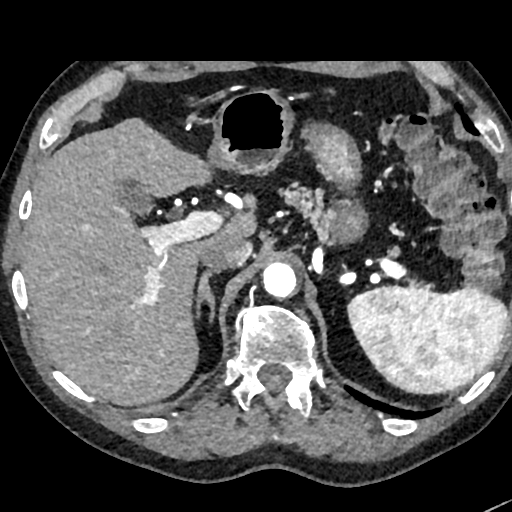
[im 435/725  lung]
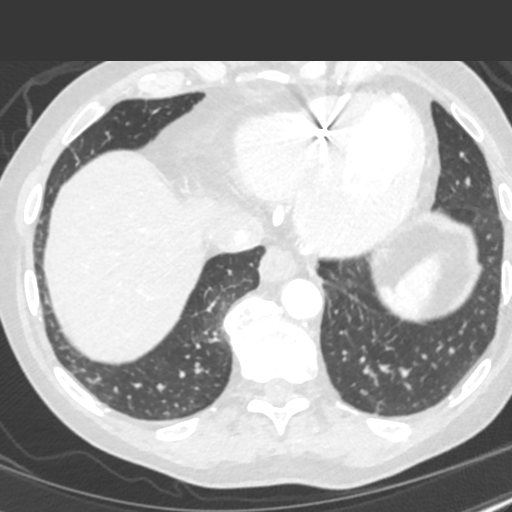
[im 483/725  mediastinal]
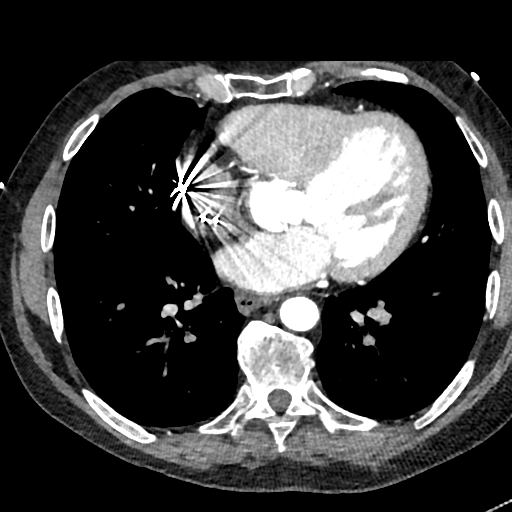
[im 531/725  lung]
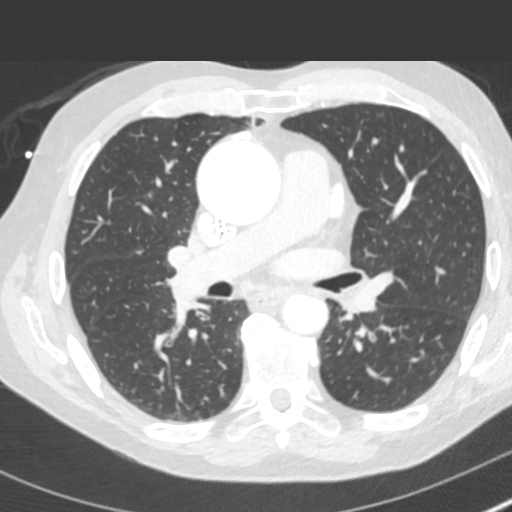
[im 580/725  mediastinal]
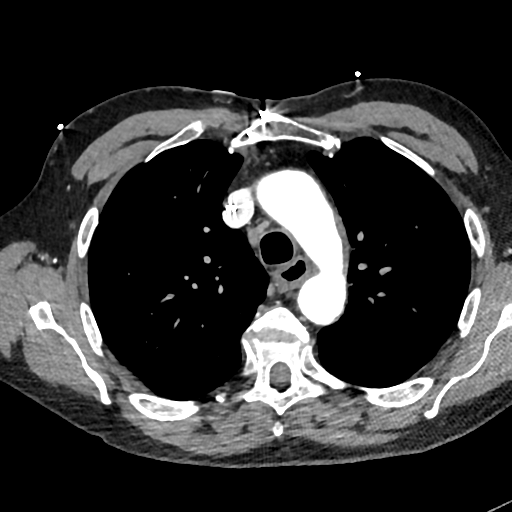
[im 628/725  lung]
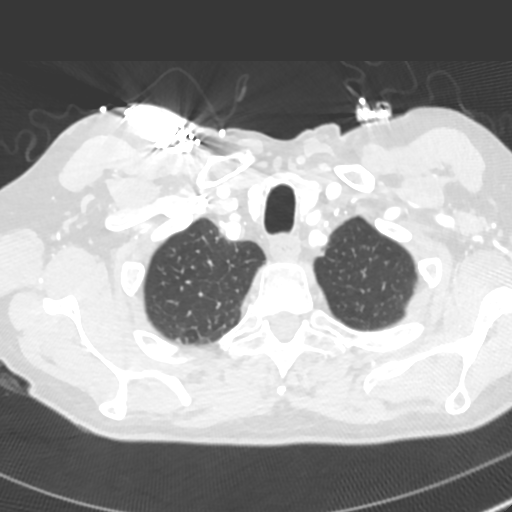
[im 676/725  mediastinal]
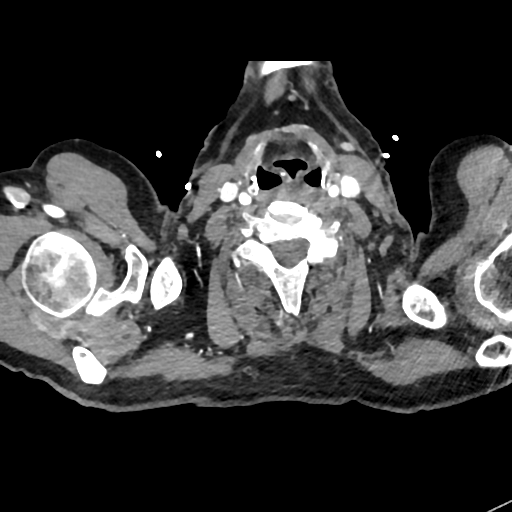

[Series 7: cor · coronal · 0.85mm/px · 1 of 126 slices shown]
[im 63/126  mediastinal]
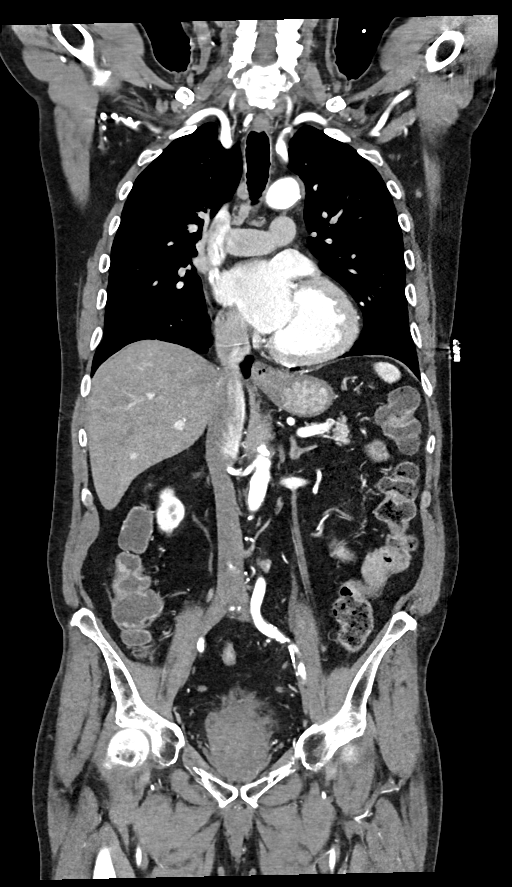

[15 of 36 positions shown; findings below may reference images not displayed]

RADIATION DOSE REDUCTION: This exam was performed according to the
departmental dose-optimization program which includes automated
exposure control, adjustment of the mA and/or kV according to
patient size and/or use of iterative reconstruction technique.

CONTRAST:  95mL OMNIPAQUE IOHEXOL 350 MG/ML SOLN
FINDINGS: CTA CHEST FINDINGS

Cardiovascular: Heart size is enlarged with left ventricular
dilatation. There is no significant pericardial fluid, thickening or
pericardial calcification. There is aortic atherosclerosis, as well
as atherosclerosis of the great vessels of the mediastinum and the
coronary arteries, including calcified atherosclerotic plaque in the
left anterior descending and left circumflex coronary arteries.
Status post median sternotomy for mechanical aortic valve
replacement. Aneurysmal dilatation of the ascending thoracic aorta
(4.9 cm in diameter). Right-sided pacemaker device in place with
lead tips terminating in the right atrium and right ventricular
apex.

Mediastinum/Lymph Nodes: No pathologically enlarged mediastinal or
hilar lymph nodes. Esophagus is unremarkable in appearance. No
axillary lymphadenopathy.

Lungs/Pleura: No suspicious appearing pulmonary nodules or masses
are noted. No acute consolidative airspace disease. No pleural
effusions.

Musculoskeletal/Soft Tissues: Median sternotomy wires. There are no
aggressive appearing lytic or blastic lesions noted in the
visualized portions of the skeleton.

CTA ABDOMEN AND PELVIS FINDINGS

Hepatobiliary: No suspicious cystic or solid hepatic lesions. No
intra or extrahepatic biliary ductal dilatation. Gallbladder is
normal in appearance.

Pancreas: No pancreatic mass. No pancreatic ductal dilatation. No
pancreatic or peripancreatic fluid collections or inflammatory
changes.

Spleen: Unremarkable.

Adrenals/Urinary Tract: Subcentimeter low-attenuation lesions in the
kidneys bilaterally, too small to definitively characterize, but
statistically likely to represent tiny cysts. Mild generalized
cortical atrophy in both kidneys. No hydroureteronephrosis.
Bilateral adrenal glands are normal in appearance. Along the
posterior wall of the urinary bladder on the left side (axial image
193 of series 5) there is a 5 mm high attenuation focus, potentially
a small enhancing urothelial lesion. Mild diffuse bladder wall
thickening.

Stomach/Bowel: The appearance of the stomach is normal. There is no
pathologic dilatation of small bowel or colon. Normal appendix,
which extends into the neck of a small right inguinal hernia.

Vascular/Lymphatic: Aortic atherosclerosis, with vascular findings
and measurements pertinent to potential TAVR procedure, as detailed
below. No lymphadenopathy noted in the abdomen or pelvis.

Reproductive: Prostate gland is severely enlarged with median lobe
hypertrophy measuring approximately 6.5 x 6.7 x 8.9 cm. Seminal
vesicles are unremarkable in appearance.

Other: No significant volume of ascites.  No pneumoperitoneum.

Musculoskeletal: There are no aggressive appearing lytic or blastic
lesions noted in the visualized portions of the skeleton.

VASCULAR MEASUREMENTS PERTINENT TO TAVR:

AORTA:

Minimal Aortic 3iameter-PW x 11 mm

Severity of Aortic Calcification-mild

RIGHT PELVIS:

Right Common Iliac Artery -

Minimal Qiameter-H.Z x 6.4 mm

Tortuosity-mild

Calcification-mild

Right External Iliac Artery -

Minimal Kiameter-X.I x 6.4 mm

Tortuosity-mild

Calcification-mild

Right Common Femoral Artery -

Minimal Kiameter-X.I x 5.6 mm

Tortuosity-mild

Calcification-mild-to-moderate

LEFT PELVIS:

Left Common Iliac Artery -

Minimal Viameter-O.P x 7.2 mm

Tortuosity-mild

Calcification-mild

Left External Iliac Artery -

Minimal Uiameter-4.R x 2.6 mm

Tortuosity-mild

Calcification-moderate

Left Common Femoral Artery -

Minimal Qiameter-H.Z x 5.8 mm

Tortuosity-mild

Calcification-mild-to-moderate

Review of the MIP images confirms the above findings.
IMPRESSION: 1. Vascular findings and measurements pertinent to potential TAVR
procedure, as detailed above.
2. Patient is status post aortic valve replacement with mechanical
aortic valve.
3. Aortic atherosclerosis, in addition to 2 vessel coronary artery
disease. There is also aneurysmal dilatation of the ascending
thoracic aorta which measures up to 4.9 cm in diameter. Ascending
thoracic aortic aneurysm. Recommend semi-annual imaging followup by
CTA or MRA and referral to cardiothoracic surgery if not already
obtained. This recommendation follows 9888
ACCF/AHA/AATS/ACR/ASA/SCA/MENG SAM/CHANITO/SAHIR/PREDSEDA Guidelines for the
Diagnosis and Management of Patients With Thoracic Aortic Disease.
Circulation. 9888; 121: E266-e369. Aortic aneurysm NOS
([id number]).
4. 5 mm focus of potential urothelial enhancement along the
posterior wall of the urinary bladder, as above. Urologic
consultation is recommended in the near future for further clinical
evaluation to exclude potential urothelial neoplasm.
5. Prostatomegaly with severe median lobe hypertrophy. Mild
generalized bladder wall thickening, suggesting bladder outlet
obstruction.
6. Additional incidental findings, as above.

## 2022-05-23 ENCOUNTER — Encounter: Payer: Self-pay | Admitting: Family Medicine

## 2022-05-23 ENCOUNTER — Ambulatory Visit (INDEPENDENT_AMBULATORY_CARE_PROVIDER_SITE_OTHER): Payer: Medicare Other | Admitting: Family Medicine

## 2022-05-23 VITALS — BP 138/80 | HR 86 | Temp 97.6°F | Ht 69.0 in | Wt 185.0 lb

## 2022-05-23 DIAGNOSIS — Z952 Presence of prosthetic heart valve: Secondary | ICD-10-CM

## 2022-05-23 DIAGNOSIS — Z23 Encounter for immunization: Secondary | ICD-10-CM | POA: Diagnosis not present

## 2022-05-23 DIAGNOSIS — R21 Rash and other nonspecific skin eruption: Secondary | ICD-10-CM

## 2022-05-23 DIAGNOSIS — Z7901 Long term (current) use of anticoagulants: Secondary | ICD-10-CM

## 2022-05-23 DIAGNOSIS — I340 Nonrheumatic mitral (valve) insufficiency: Secondary | ICD-10-CM | POA: Diagnosis not present

## 2022-05-23 MED ORDER — TRIAMCINOLONE ACETONIDE 0.1 % EX CREA: 1.0000 | TOPICAL_CREAM | Freq: Two times a day (BID) | CUTANEOUS | 0 refills | Status: DC

## 2022-05-23 MED ORDER — PREDNISONE 20 MG PO TABS: ORAL_TABLET | ORAL | 0 refills | Status: DC

## 2022-05-23 MED ORDER — FAMOTIDINE 20 MG PO TABS: 20.0000 mg | ORAL_TABLET | Freq: Every day | ORAL | Status: AC

## 2022-05-23 MED ORDER — CYANOCOBALAMIN 500 MCG PO TABS: 500.0000 ug | ORAL_TABLET | Freq: Two times a day (BID) | ORAL | Status: DC

## 2022-05-23 NOTE — Assessment & Plan Note (Addendum)
Previous eval by derm (Tafeen) suspicious for drug rash to metoprolol or losartan. He is now off losartan and on Entresto. He continues Toprol XL 24m daily.  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 Discussed with patient. Rec try to break itch/scratch cycle. Will Rx prednisone taper, continue claritin with nightly benadryl PRN, add pepcid 262mnightly, add topical TCI cream.  Check labwork for other systemic cause of pruritis (LFTs, TSH, ESR).  Will also refer to dermatology for re evaluation. Pt and wife agree with plan.

## 2022-05-23 NOTE — Patient Instructions (Addendum)
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  Take prednisone taper sent to pharmacy to calm the itch.  Continue claritin 10mg  daily. Add pepcid 20mg  nightly. May use benadryl at night as needed. May use topical steroid cream sent to pharmacy.  We will refer you to dermatologist as well.   Neurodermatitis or Lichen Simplex Chronicus Neurodermatitis is inflammation and thickening of the skin. It is caused by severe itchiness, which leads to repeated scratching and rubbing of the skin. It can happen anywhere on the body. Common places include the neck, head, arms, and legs. Neurodermatitis may have mental or emotional (psychogenic) causes that may need treatment. Usually, this is a lifelong (chronic) condition, but in some cases it may go away on its own. What are the causes? This condition is caused by repeated scratching and rubbing of the skin. This happens because of feelings of severe itchiness. Often, the cause of itchiness is not known. Common causes include: Materials or substances that irritate the skin. Skin conditions, such as chronic dermatitis or eczema. Allergic reaction. In some cases, neurodermatitis may have psychogenic causes, like anxiety or stress, that may need to be treated. What increases the risk? You are more likely to develop this condition if: You have dry skin or other skin conditions. You have an anxiety disorder or a lot of stress. You are 45-86 years old. You are male. You are exposed to irritating chemicals. What are the signs or symptoms? The main symptom of this condition is one or more patches of skin that are red, swollen, itchy, and thicker than normal. These patches can be anywhere on the body, but are often on the head, neck, legs, and arms. This condition can also affect the genital and anal areas. In severe cases, bleeding, crusting, and scaling of the skin can occur. Symptoms often come and go over time. The frequency and severity  of symptoms varies from person to person. How is this diagnosed? This condition is diagnosed based on your medical history and a physical exam of your skin. You may be referred to a health care provider who specializes in skin care (dermatologist). You may also have tests, including: Skin allergy tests. These tests will determine if you have an allergy that is causing your condition. Blood or other lab tests. These tests can help to determine if your itching is caused by an infection or other condition. In some cases, your health care provider may remove a small amount of skin cells to be examined under a microscope (biopsy). How is this treated? This condition is managed by stopping all scratching and rubbing of the affected area. It is also important to remove all skin irritants and treat any underlying causes of neurodermatitis. Depending on the cause, your health care provider may recommend certain treatments such as: Creams, lotions, or pills to reduce inflammation and itching (corticosteroids). Medicines to prevent or treat infection (antibiotics). Medicines to relieve allergy symptoms (antihistamines). Therapy to learn how to stop feelings of itchiness and stop scratching (behavioral therapy or psychotherapy). Your health care provider may recommend a doctor who specializes in human behavior (psychologist). Phototherapy. This is using light to treat a skin problem. This condition sometimes goes away without treatment. Follow these instructions at home: Skin care  Avoid scratching and rubbing your skin. This is the best way to manage neurodermatitis and prevent it from returning. Keep your skin clean and moisturized. Avoid very hot water. Apply lotion at least one time per day.  Avoid products, such as certain soaps and lotions, that have harsh chemicals, scents, and dyes. Shower and take baths only as often as you need to. Frequent bathing can dry out your skin. Avoid tight or rough  clothing that irritates your skin. Apply cool washcloths to your skin to help reduce itching. General instructions Take or use over-the-counter and prescription medicines only as told by your health care provider. Avoid irritating chemicals. Pay attention to your symptoms. Watch for things that trigger itching and scratching. Keep your fingernails cut short to reduce injury from scratching. Keep all follow-up visits. Contact a health care provider if: Your condition gets worse or does not get better after 3-4 days of treatment. You have blood or fluid leaking from an irritated patch of skin. You have a fever. Summary Neurodermatitis is inflammation and thickening of the skin. It is caused by severe itchiness, which leads to repeated scratching and rubbing of the skin. Depending on the cause, your health care provider may recommend certain treatments such as creams, lotions, and pills to reduce inflammation, and other medicines to treat infection, if needed. Treatment may also include therapy to learn how to stop feelings of itchiness and stop scratching (behavioral therapy or psychotherapy). Take or use over-the-counter and prescription medicines only as told by your health care provider. Pay attention to your symptoms. Watch for things that trigger itching and scratching. This information is not intended to replace advice given to you by your health care provider. Make sure you discuss any questions you have with your health care provider. Document Revised: 07/27/2021 Document Reviewed: 07/27/2021 Elsevier Patient Education  2023 [employer].

## 2022-05-23 NOTE — Assessment & Plan Note (Signed)
He continues coumadin managed by our coumadin clinic with INR goal 2.5-3.5 given prosthetic aortic valve.

## 2022-05-23 NOTE — Progress Notes (Signed)
Patient ID: Noah Thompson, male    DOB: 08-07-1940, 81 y.o.   MRN: [id number]  This visit was conducted in person.  BP 138/80   Pulse 86   Temp 97.6 F (36.4 C) (Temporal)   Ht 5' 9" (1.753 m)   Wt 185 lb (83.9 kg)   SpO2 95%   BMI 27.32 kg/m   BP Readings from Last 3 Encounters:  05/23/22 138/80  05/01/22 (!) 142/78  04/11/22 138/72    CC: rash Subjective:   HPI: Noah Thompson is a 81 y.o. male presenting on 05/23/2022 for No chief complaint on file.   Rash present for years. Fluctuates in intensity.   H/o skin rash ?drug rash to Toprol or losartan - previously saw dermatology Denna Haggard) for this. He thinks this is an itch that rashes. Worse at night time. He's been taking claritin with benadryl at night  No new lotions, detergents, soaps or shampoos.  No new medicines, supplements, vitamins.  He's taking b12 544mg BID - for the past 3 months.   He does endorse h/o hives, latest last night.  Wife is unaffected.  No fevers/chills, oral lesions, abd pain, nausea, diarrhea. No stool color change.   Known prosthetic St Jude mechanical aortic valve, with trivial AR. Severe prosthetic dysfunction noted on TEE 08/2021. HFrEF with EF 30-35%. Mod MR. Mod ascending aortic dilation to 457m(08/2021) followed by cardiothoracic surgery. 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 On coumadin (goal 2.5-3.5) for 30+ yrs. Needs dental prophylaxis.  Lab Results  Component Value Date   INR 3.9 (A) 05/18/2022   INR 2.5 04/06/2022   INR 2.6 03/09/2022   PROTIME 19.9 12/07/2008        Relevant past medical, surgical, family and social history reviewed and updated as indicated. Interim medical history since our last visit reviewed. Allergies and medications reviewed and updated. Outpatient Medications Prior to Visit  Medication Sig Dispense Refill   acetaminophen (TYLENOL) 500 MG tablet Take 500-1,000 mg by mouth every 6 (six) hours as needed (for pain.).     alum & mag hydroxide-simeth  (MAALOX/MYLANTA) 200-200-20 MG/5ML suspension Take 15 mLs by mouth every 6 (six) hours as needed for indigestion or heartburn.     loperamide (IMODIUM) 2 MG capsule Take 1 capsule (2 mg total) by mouth 4 (four) times daily as needed for diarrhea or loose stools.     loratadine (CLARITIN) 10 MG tablet Take 1 tablet (10 mg total) by mouth daily as needed for allergies.     metoprolol succinate (TOPROL-XL) 50 MG 24 hr tablet Take 1 tablet by mouth once daily 90 tablet 2   Misc Natural Products (IMMUNE FORMULA PO) Take 2 tablets by mouth in the morning. Vitafusion Triple Immune Power Gummy Vitamins     omeprazole (PRILOSEC) 40 MG capsule Take 1 capsule (40 mg total) by mouth daily. Take daily for 3 days then as needed (Patient taking differently: Take 40 mg by mouth daily as needed (indigestion/stomach pain.).) 30 capsule 3   sacubitril-valsartan (ENTRESTO) 97-103 MG Take 1 tablet by mouth 2 (two) times daily. 180 tablet 1   warfarin (COUMADIN) 5 MG tablet TAKE 1 TABLET BY MOUTH DAILY EXCEPT TAKE 1/2 TABLET ON SUNDAYS, TUESDAYS AND THURSDAYS OR AS DIRECTED BY ANTICOAGULATION CLINIC 110 tablet 1   No facility-administered medications prior to visit.     Per HPI unless specifically indicated in ROS section below Review of Systems  Objective:  BP 138/80   Pulse 86  Temp 97.6 F (36.4 C) (Temporal)   Ht 5' 9" (1.753 m)   Wt 185 lb (83.9 kg)   SpO2 95%   BMI 27.32 kg/m   Wt Readings from Last 3 Encounters:  05/23/22 185 lb (83.9 kg)  05/01/22 180 lb (81.6 kg)  04/11/22 180 lb (81.6 kg)      Physical Exam Vitals and nursing note reviewed.  Constitutional:      Appearance: Normal appearance. He is not ill-appearing.  Skin:    General: Skin is warm and dry.     Findings: Erythema and rash present. Rash is papular.     Comments: Intensely pruritic papular rash that leads to erosions after scratching, with evidence of excoriations throughout arms, legs, and trunk including back.  Thickening/lichenification of skin evident to anterior thighs  Neurological:     Mental Status: He is alert.  Psychiatric:        Mood and Affect: Mood normal.        Behavior: Behavior normal.       Results for orders placed or performed in visit on 05/18/22  POCT INR  Result Value Ref Range   INR 3.9 (A) 2.0 - 3.0   Lab Results  Component Value Date   CREATININE 1.27 05/02/2022   BUN 19 05/02/2022   NA 140 05/02/2022   K 4.5 05/02/2022   CL 103 05/02/2022   CO2 26 05/02/2022  Glu 95 GFR 57  Lab Results  Component Value Date   WBC 8.1 05/02/2022   HGB 13.1 05/02/2022   HCT 40.6 05/02/2022   MCV 90 05/02/2022   PLT 304 05/02/2022  No results found for: "HGBA1C"   Lab Results  Component Value Date   ALT 15 05/24/2020   AST 10 05/24/2020   ALKPHOS 77 05/24/2020   BILITOT 0.6 05/24/2020    Lab Results  Component Value Date   TSH 1.81 11/07/2018    Assessment & Plan:  He will return for physical as due.   Problem List Items Addressed This Visit       Unprioritized   Long term current use of anticoagulant therapy    He continues coumadin managed by our coumadin clinic with INR goal 2.5-3.5 given prosthetic aortic valve.       Skin rash - Primary    Previous eval by derm ([facility]) suspicious for drug rash to metoprolol or losartan. He is now off losartan and on Entresto. He continues Toprol XL 27m daily.  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 Discussed with patient. Rec try to break itch/scratch cycle. Will Rx prednisone taper, continue claritin with nightly benadryl PRN, add pepcid 238mnightly, add topical TCI cream.  Check labwork for other systemic cause of pruritis (LFTs, TSH, ESR).  Will also refer to dermatology for re evaluation. Pt and wife agree with plan.       Relevant Orders   Ambulatory referral to Dermatology   Sedimentation rate   TSH   Hepatic function panel   S/P aortic valve replacement   Nonrheumatic mitral valve  regurgitation   Other Visit Diagnoses     Flu vaccine need       Need for immunization against influenza       Relevant Orders   Flu Vaccine QUAD 31m33mo (Fluarix, Fluzone & Alfiuria Quad PF) (Completed)        Meds ordered this encounter  Medications   cyanocobalamin (V-R VITAMIN B-12) 500 MCG tablet    Sig: Take 1 tablet (  500 mcg total) by mouth in the morning and at bedtime.   famotidine (PEPCID) 20 MG tablet    Sig: Take 1 tablet (20 mg total) by mouth at bedtime.   predniSONE (DELTASONE) 20 MG tablet    Sig: Take two tablets daily for 3 days followed by one tablet daily for 4 days    Dispense:  10 tablet    Refill:  0   triamcinolone cream (KENALOG) 0.1 %    Sig: Apply 1 Application topically 2 (two) times daily.    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  Take prednisone taper sent to pharmacy to calm the itch.  Continue claritin 140mdaily. Add pepcid 2024mightly. May use benadryl at night as needed. May use topical steroid cream sent to pharmacy.  We will refer you to dermatologist as well.   Follow up plan: Return if symptoms worsen or fail to improve, for annual exam, prior fasting for blood work.  JavRia BushD

## 2022-05-24 LAB — HEPATIC FUNCTION PANEL
ALT: 10 U/L (ref 0–53)
AST: 7 U/L (ref 0–37)
Albumin: 4.5 g/dL (ref 3.5–5.2)
Alkaline Phosphatase: 73 U/L (ref 39–117)
Bilirubin, Direct: 0.1 mg/dL (ref 0.0–0.3)
Total Bilirubin: 0.5 mg/dL (ref 0.2–1.2)
Total Protein: 6.5 g/dL (ref 6.0–8.3)

## 2022-05-24 LAB — SEDIMENTATION RATE: Sed Rate: 9 mm/hr (ref 0–20)

## 2022-05-24 LAB — TSH: TSH: 2.02 u[IU]/mL (ref 0.35–5.50)

## 2022-05-30 ENCOUNTER — Telehealth: Payer: Self-pay

## 2022-05-30 NOTE — Progress Notes (Signed)
Patient wife aware of results and recommendations. 

## 2022-05-30 NOTE — Telephone Encounter (Signed)
Left message for patient to call back to discuss lab results and recommendations. 

## 2022-05-30 NOTE — Telephone Encounter (Signed)
Patient wife aware of results and recommendations.

## 2022-05-30 NOTE — Telephone Encounter (Signed)
-----   Message from [name], MD sent at 05/30/2022  7:18 AM EST ----- Plz notify labwork returned normal - thyroid, inflammatory marker, liver function.  Hopefully prednisone course helped. Will await dermatology evaluation.

## 2022-06-05 ENCOUNTER — Telehealth: Payer: Self-pay | Admitting: Internal Medicine

## 2022-06-05 MED ORDER — SACUBITRIL-VALSARTAN 97-103 MG PO TABS: 1.0000 | ORAL_TABLET | Freq: Two times a day (BID) | ORAL | 3 refills | Status: DC

## 2022-06-05 NOTE — Telephone Encounter (Signed)
Pt c/o medication issue:  1. Name of Medication:   sacubitril-valsartan (ENTRESTO) 97-103 MG    2. How are you currently taking this medication (dosage and times per day)?  ake 1 tablet by mouth 2 (two) times daily.   3. Are you having a reaction (difficulty breathing--STAT)? No  4. What is your medication issue? Spouse states that an E - script needs to be sent to [facility]. Please advise

## 2022-06-05 NOTE — Telephone Encounter (Signed)
**Note De-Identified Noah Thompson Obfuscation** The pt requested that I s/w his wife. Noah Thompson states that when she tried to order the pts next shipment of Entresto from [facility] they advised her that they needed a new RX e-scribed to them.  I have e-scribed Entresto 97-103 mg #180 with 3 refills to [facility] (pharmacy for [facility]).  I have also completed the providers page of a [facility] application and have e-mailed it to Dr Debby Bud nurse so she can obtain his signature, date it, and to fax all to [facility] at the fax number written on the cover letter included, just in case it is needed.

## 2022-06-06 ENCOUNTER — Ambulatory Visit (INDEPENDENT_AMBULATORY_CARE_PROVIDER_SITE_OTHER): Payer: Medicare Other

## 2022-06-06 ENCOUNTER — Telehealth: Payer: Self-pay

## 2022-06-06 DIAGNOSIS — I428 Other cardiomyopathies: Secondary | ICD-10-CM

## 2022-06-06 DIAGNOSIS — I4891 Unspecified atrial fibrillation: Secondary | ICD-10-CM

## 2022-06-06 LAB — CUP PACEART REMOTE DEVICE CHECK
Battery Remaining Longevity: 1 mo
Battery Remaining Percentage: 0.5 %
Battery Voltage: 2.59 V
Brady Statistic AP VP Percent: 1 %
Brady Statistic AP VS Percent: 17 %
Brady Statistic AS VP Percent: 1 %
Brady Statistic AS VS Percent: 81 %
Brady Statistic RA Percent Paced: 15 %
Brady Statistic RV Percent Paced: 1 %
Date Time Interrogation Session: 20231205024703
Implantable Lead Connection Status: 753985
Implantable Lead Connection Status: 753985
Implantable Lead Implant Date: 19930223
Implantable Lead Implant Date: 19930223
Implantable Lead Location: 753859
Implantable Lead Location: 753860
Implantable Pulse Generator Implant Date: 20130725
Lead Channel Impedance Value: 450 Ohm
Lead Channel Impedance Value: 450 Ohm
Lead Channel Pacing Threshold Amplitude: 1.25 V
Lead Channel Pacing Threshold Amplitude: 1.25 V
Lead Channel Pacing Threshold Pulse Width: 0.5 ms
Lead Channel Pacing Threshold Pulse Width: 1 ms
Lead Channel Sensing Intrinsic Amplitude: 4.2 mV
Lead Channel Sensing Intrinsic Amplitude: 9.5 mV
Lead Channel Setting Pacing Amplitude: 2.5 V
Lead Channel Setting Pacing Amplitude: 2.5 V
Lead Channel Setting Pacing Pulse Width: 0.5 ms
Lead Channel Setting Sensing Sensitivity: 2 mV
Pulse Gen Model: 2210
Pulse Gen Serial Number: 7364115

## 2022-06-06 NOTE — Telephone Encounter (Signed)
Alert received from [employer]:  Scheduled remote reviewed. Normal device function.   Device at ERI per voltage 2.59V, ERI 2.60V   Spoke with [name].  Advised Pt will hit ERI in the next few days.  Advised would send to Dr. Lubertha Basque nurse to schedule gen change vs possible upgrade to BIV device if vein is open.  No additional office visit needed (see last OV note).

## 2022-06-06 NOTE — Telephone Encounter (Signed)
Per Dr. Ladona Ridgel / Device RN Team,   Pt scheduled for a [facility] Generator Change, with possible upgrade to BiV PPM on 07/06/2022 at 130 pm.   Procedure scheduled with cath lab on 06/06/2022.   Staff message sent to [employer] / [place].   Pt needs Letter, Labs, surgical scrub, and orders.   Orders / procedure scheduled at the end of the day 06/06/22. Follow up required.

## 2022-06-07 NOTE — Telephone Encounter (Signed)
Pt assistance application signed by MD and faxed as requested.  

## 2022-06-09 ENCOUNTER — Other Ambulatory Visit: Payer: Self-pay | Admitting: Family Medicine

## 2022-06-09 NOTE — Telephone Encounter (Signed)
Kenalog cream Last filled:  05/23/22, #80 g Last OV:  05/23/22, skin rash Next OV:  none

## 2022-06-14 NOTE — Addendum Note (Signed)
Addended by: Cleda Mccreedy on: 06/14/2022 10:24 AM   Modules accepted: Orders

## 2022-06-14 NOTE — Telephone Encounter (Signed)
Work up complete for PPM upgrade to BiV  Pt will have labs done on 12/19..he is scheduled for an INR check with PCP on 12/14.  Pt will get instruction letter and scrub when he comes in for labwork.

## 2022-06-15 ENCOUNTER — Ambulatory Visit (INDEPENDENT_AMBULATORY_CARE_PROVIDER_SITE_OTHER): Payer: Medicare Other

## 2022-06-15 DIAGNOSIS — Z7901 Long term (current) use of anticoagulants: Secondary | ICD-10-CM

## 2022-06-15 LAB — POCT INR: INR: 4.3 — AB (ref 2.0–3.0)

## 2022-06-15 NOTE — Progress Notes (Signed)
Pt has been on Amoxicillin for root canal he had last week. He has 3 more days of abx. Pt has also been on steroids in last month for a rash.   Pt has been on Amoxicillin for root canal he had last week. He has 3 more days of abx. So no long-term change in dosing is made today due to the interaction of the abx. Hold dose today and hold dose tomorrow and the continue to take 1/2 tablet daily except take 1 tablet on Mondays, Wednesdays and Fridays. Recheck in 1 weeks.

## 2022-06-15 NOTE — Patient Instructions (Addendum)
Pre visit review using our clinic review tool, if applicable. No additional management support is needed unless otherwise documented below in the visit note.  Hold dose today and hold dose tomorrow and the continue to take 1/2 tablet daily except take 1 tablet on Mondays, Wednesdays and Fridays. Recheck in 1 weeks.

## 2022-06-20 ENCOUNTER — Ambulatory Visit: Payer: Medicare Other | Attending: Internal Medicine

## 2022-06-20 DIAGNOSIS — I4891 Unspecified atrial fibrillation: Secondary | ICD-10-CM

## 2022-06-21 LAB — BASIC METABOLIC PANEL
BUN/Creatinine Ratio: 12 (ref 10–24)
BUN: 14 mg/dL (ref 8–27)
CO2: 23 mmol/L (ref 20–29)
Calcium: 9.4 mg/dL (ref 8.6–10.2)
Chloride: 106 mmol/L (ref 96–106)
Creatinine, Ser: 1.18 mg/dL (ref 0.76–1.27)
Glucose: 87 mg/dL (ref 70–99)
Potassium: 4.6 mmol/L (ref 3.5–5.2)
Sodium: 141 mmol/L (ref 134–144)
eGFR: 62 mL/min/{1.73_m2} (ref >59)

## 2022-06-21 LAB — CBC
Hematocrit: 38.8 % (ref 37.5–51.0)
Hemoglobin: 13.2 g/dL (ref 13.0–17.7)
MCH: 29.7 pg (ref 26.6–33.0)
MCHC: 34 g/dL (ref 31.5–35.7)
MCV: 87 fL (ref 79–97)
Platelets: 331 10*3/uL (ref 150–450)
RBC: 4.45 x10E6/uL (ref 4.14–5.80)
RDW: 13.1 % (ref 11.6–15.4)
WBC: 7.3 10*3/uL (ref 3.4–10.8)

## 2022-06-22 ENCOUNTER — Ambulatory Visit (INDEPENDENT_AMBULATORY_CARE_PROVIDER_SITE_OTHER): Payer: Medicare Other

## 2022-06-22 DIAGNOSIS — Z7901 Long term (current) use of anticoagulants: Secondary | ICD-10-CM

## 2022-06-22 LAB — POCT INR: INR: 2.8 (ref 2.0–3.0)

## 2022-06-22 NOTE — Progress Notes (Signed)
Pt is currently taking prednisone.  Replacing pacemaker on 1/4. Pt will inquire if warfarin needs held. Continue to take 1/2 tablet daily except take 1 tablet on Mondays, Wednesdays and Fridays. Recheck in 4 weeks.

## 2022-06-22 NOTE — Patient Instructions (Addendum)
Pre visit review using our clinic review tool, if applicable. No additional management support is needed unless otherwise documented below in the visit note.  Continue to take 1/2 tablet daily except take 1 tablet on Mondays, Wednesdays and Fridays. Recheck in 4 weeks. 

## 2022-06-27 ENCOUNTER — Other Ambulatory Visit: Payer: Self-pay | Admitting: Internal Medicine

## 2022-07-04 NOTE — Progress Notes (Signed)
Remote pacemaker transmission.   

## 2022-07-05 NOTE — Pre-Procedure Instructions (Signed)
Instructed patient on the following items: Arrival time 1130 Nothing to eat or drink after midnight No meds AM of procedure Responsible person to drive you home and stay with you for 24 hrs Wash with special soap night before and morning of procedure If on anti-coagulant drug instructions Coumadin- last dose 1/2

## 2022-07-06 ENCOUNTER — Encounter (HOSPITAL_COMMUNITY)
Admission: RE | Disposition: A | Discharge status: Home / Self Care | Payer: Medicare Other | Source: Home / Self Care | Attending: Internal Medicine

## 2022-07-06 ENCOUNTER — Ambulatory Visit (HOSPITAL_COMMUNITY): Payer: Medicare Other

## 2022-07-06 ENCOUNTER — Other Ambulatory Visit: Payer: Self-pay

## 2022-07-06 ENCOUNTER — Ambulatory Visit (HOSPITAL_COMMUNITY)
Admission: RE | Admit: 2022-07-06 | Discharge: 2022-07-06 | Disposition: A | Discharge status: Home / Self Care | Payer: Medicare Other | Attending: Internal Medicine | Admitting: Internal Medicine

## 2022-07-06 DIAGNOSIS — I110 Hypertensive heart disease with heart failure: Secondary | ICD-10-CM | POA: Insufficient documentation

## 2022-07-06 DIAGNOSIS — I5022 Chronic systolic (congestive) heart failure: Secondary | ICD-10-CM | POA: Insufficient documentation

## 2022-07-06 DIAGNOSIS — Z45010 Encounter for checking and testing of cardiac pacemaker pulse generator [battery]: Secondary | ICD-10-CM | POA: Diagnosis not present

## 2022-07-06 DIAGNOSIS — I350 Nonrheumatic aortic (valve) stenosis: Secondary | ICD-10-CM | POA: Insufficient documentation

## 2022-07-06 DIAGNOSIS — I447 Left bundle-branch block, unspecified: Secondary | ICD-10-CM | POA: Insufficient documentation

## 2022-07-06 DIAGNOSIS — Z950 Presence of cardiac pacemaker: Secondary | ICD-10-CM | POA: Diagnosis not present

## 2022-07-06 DIAGNOSIS — Z952 Presence of prosthetic heart valve: Secondary | ICD-10-CM | POA: Insufficient documentation

## 2022-07-06 DIAGNOSIS — I442 Atrioventricular block, complete: Secondary | ICD-10-CM | POA: Diagnosis not present

## 2022-07-06 DIAGNOSIS — I428 Other cardiomyopathies: Secondary | ICD-10-CM | POA: Diagnosis not present

## 2022-07-06 DIAGNOSIS — I495 Sick sinus syndrome: Secondary | ICD-10-CM | POA: Diagnosis not present

## 2022-07-06 HISTORY — PX: PPM GENERATOR CHANGEOUT: EP1233

## 2022-07-06 LAB — PROTIME-INR
INR: 2.2 — ABNORMAL HIGH (ref 0.8–1.2)
Prothrombin Time: 24.6 seconds — ABNORMAL HIGH (ref 11.4–15.2)

## 2022-07-06 SURGERY — PPM GENERATOR CHANGEOUT

## 2022-07-06 MED ORDER — LIDOCAINE HCL (PF) 1 % IJ SOLN
INTRAMUSCULAR | Status: AC
  Filled 2022-07-06: qty 60

## 2022-07-06 MED ORDER — CEFAZOLIN SODIUM-DEXTROSE 1-4 GM/50ML-% IV SOLN: 1.0000 g | Freq: Once | INTRAVENOUS | Status: DC

## 2022-07-06 MED ORDER — POVIDONE-IODINE 10 % EX SWAB
2.0000 | Freq: Once | CUTANEOUS | Status: AC
  Administered 2022-07-06: 2 via TOPICAL

## 2022-07-06 MED ORDER — IOHEXOL 350 MG/ML SOLN
INTRAVENOUS | Status: DC | PRN
  Administered 2022-07-06: 25 mL

## 2022-07-06 MED ORDER — FENTANYL CITRATE (PF) 100 MCG/2ML IJ SOLN
INTRAMUSCULAR | Status: AC
  Filled 2022-07-06: qty 2

## 2022-07-06 MED ORDER — CEFAZOLIN SODIUM-DEXTROSE 2-4 GM/100ML-% IV SOLN
2.0000 g | INTRAVENOUS | Status: AC
  Administered 2022-07-06: 2 g via INTRAVENOUS

## 2022-07-06 MED ORDER — LIDOCAINE HCL (PF) 1 % IJ SOLN
INTRAMUSCULAR | Status: DC | PRN
  Administered 2022-07-06: 60 mL

## 2022-07-06 MED ORDER — ACETAMINOPHEN 325 MG PO TABS: 325.0000 mg | ORAL_TABLET | ORAL | Status: DC | PRN

## 2022-07-06 MED ORDER — CEFAZOLIN SODIUM-DEXTROSE 2-4 GM/100ML-% IV SOLN
INTRAVENOUS | Status: AC
  Filled 2022-07-06: qty 100

## 2022-07-06 MED ORDER — SODIUM CHLORIDE 0.9 % IV SOLN
INTRAVENOUS | Status: AC
  Filled 2022-07-06: qty 2

## 2022-07-06 MED ORDER — SODIUM CHLORIDE 0.9 % IV SOLN: INTRAVENOUS | Status: DC

## 2022-07-06 MED ORDER — HEPARIN (PORCINE) IN NACL 1000-0.9 UT/500ML-% IV SOLN
INTRAVENOUS | Status: DC | PRN
  Administered 2022-07-06: 500 mL

## 2022-07-06 MED ORDER — MIDAZOLAM HCL 5 MG/5ML IJ SOLN
INTRAMUSCULAR | Status: DC | PRN
  Administered 2022-07-06: 1 mg via INTRAVENOUS

## 2022-07-06 MED ORDER — SODIUM CHLORIDE 0.9 % IV SOLN
80.0000 mg | INTRAVENOUS | Status: AC
  Administered 2022-07-06: 80 mg

## 2022-07-06 MED ORDER — ONDANSETRON HCL 4 MG/2ML IJ SOLN: 4.0000 mg | Freq: Four times a day (QID) | INTRAMUSCULAR | Status: DC | PRN

## 2022-07-06 MED ORDER — FENTANYL CITRATE (PF) 100 MCG/2ML IJ SOLN
INTRAMUSCULAR | Status: DC | PRN
  Administered 2022-07-06: 12.5 ug via INTRAVENOUS

## 2022-07-06 MED ORDER — CHLORHEXIDINE GLUCONATE 4 % EX LIQD
4.0000 | Freq: Once | CUTANEOUS | Status: AC
  Administered 2022-07-06: 4 via TOPICAL

## 2022-07-06 MED ORDER — MIDAZOLAM HCL 5 MG/5ML IJ SOLN
INTRAMUSCULAR | Status: AC
  Filled 2022-07-06: qty 5

## 2022-07-06 SURGICAL SUPPLY — 15 items
CABLE SURGICAL S-101-97-12 (CABLE) ×2 IMPLANT
CATH CPS DIRECT 135 DS2C020 (CATHETERS) IMPLANT
CATH CPS DIRECT RT DS2C023 (CATHETERS) IMPLANT
CATH JOSEPH QUAD ALLRED 6F REP (CATHETERS) IMPLANT
CPS IMPLANT KIT 410190 (MISCELLANEOUS) IMPLANT
KIT MICROPUNCTURE NIT STIFF (SHEATH) IMPLANT
LEAD QUARTET 1458Q-86CM (Lead) IMPLANT
PACEMAKER QUDR ALLR CRT PM3562 (Pacemaker) IMPLANT
PAD DEFIB RADIO PHYSIO CONN (PAD) ×2 IMPLANT
PMKR QUADRA ALLURE CRT PM3562 (Pacemaker) ×1 IMPLANT
POUCH AIGIS-R ANTIBACT PPM (Mesh General) ×1 IMPLANT
POUCH AIGIS-R ANTIBACT PPM MED (Mesh General) IMPLANT
SLITTER AGILIS HISPRO (INSTRUMENTS) IMPLANT
TRAY PACEMAKER INSERTION (PACKS) ×2 IMPLANT
WIRE ACUITY WHISPER EDS 4648 (WIRE) IMPLANT

## 2022-07-06 NOTE — Discharge Instructions (Signed)
After Your Pacemaker   You have a Abbott Pacemaker  ACTIVITY Do not lift your arm above shoulder height for 1 week after your procedure. After 7 days, you may progress as below.  You should remove your sling 24 hours after your procedure, unless otherwise instructed by your provider.     Thursday July 13, 2022  Friday July 14, 2022 Saturday July 15, 2022 Sunday July 16, 2022   Do not lift, push, pull, or carry anything over 10 pounds with the affected arm until 6 weeks (Thursday August 17, 2022 ) after your procedure.   You may drive AFTER your wound check, unless you have been told otherwise by your provider.   Ask your healthcare provider when you can go back to work   INCISION/Dressing If you are on a blood thinner such as Coumadin, Xarelto, Eliquis, Plavix, or Pradaxa please confirm with your provider when this should be resumed.   If large square, outer bandage is left in place, this can be removed after 24 hours from your procedure. Do not remove steri-strips or glue as below.   Monitor your Pacemaker site for redness, swelling, and drainage. Call the device clinic at [phone] if you experience these symptoms or fever/chills.  If your incision is sealed with Steri-strips or staples, you may shower 7 days after your procedure or when told by your provider. Do not remove the steri-strips or let the shower hit directly on your site. You may wash around your site with soap and water.    If you were discharged in a sling, please do not wear this during the day more than 48 hours after your surgery unless otherwise instructed. This may increase the risk of stiffness and soreness in your shoulder.   Avoid lotions, ointments, or perfumes over your incision until it is well-healed.  You may use a hot tub or a pool AFTER your wound check appointment if the incision is completely closed.  Pacemaker Alerts:  Some alerts are vibratory and others beep. These are NOT  emergencies. Please call our office to let us know. If this occurs at night or on weekends, it can wait until the next business day. Send a remote transmission.  If your device is capable of reading fluid status (for heart failure), you will be offered monthly monitoring to review this with you.   DEVICE MANAGEMENT Remote monitoring is used to monitor your pacemaker from home. This monitoring is scheduled every 91 days by our office. It allows [place] to keep an eye on the functioning of your device to ensure it is working properly. You will routinely see your Electrophysiologist annually (more often if necessary).   You should receive your ID card for your new device in 4-8 weeks. Keep this card with you at all times once received. Consider wearing a medical alert bracelet or necklace.  Your Pacemaker may be MRI compatible. This will be discussed at your next office visit/wound check.  You should avoid contact with strong electric or magnetic fields.   Do not use amateur (ham) radio equipment or electric (arc) welding torches. MP3 player headphones with magnets should not be used. Some devices are safe to use if held at least 12 inches (30 cm) from your Pacemaker. These include power tools, lawn mowers, and speakers. If you are unsure if something is safe to use, ask your health care provider.  When using your cell phone, hold it to the ear that is on the opposite side from the  Pacemaker. Do not leave your cell phone in a pocket over the Pacemaker.  You may safely use electric blankets, heating pads, computers, and microwave ovens.  Call the office right away if: You have chest pain. You feel more short of breath than you have felt before. You feel more light-headed than you have felt before. Your incision starts to open up.  This information is not intended to replace advice given to you by your health care provider. Make sure you discuss any questions you have with your health care provider.

## 2022-07-06 NOTE — Progress Notes (Signed)
[id number] - Murray Hodgkins, NP page. He reviewed xray report . No additional orders received prior to discharge.

## 2022-07-06 NOTE — Interval H&P Note (Signed)
History and Physical Interval Note:  07/06/2022 1:16 PM  Noah Thompson  has presented today for surgery, with the diagnosis of heart failure - left bundle branch block.  The various methods of treatment have been discussed with the patient and family. After consideration of risks, benefits and other options for treatment, the patient has consented to  Procedure(s): PPM GENERATOR CHANGEOUT (N/A) as a surgical intervention.  The patient's history has been reviewed, patient examined, no change in status, stable for surgery.  I have reviewed the patient's chart and labs.  Questions were answered to the patient's satisfaction.     Cristopher Peru

## 2022-07-06 NOTE — H&P (Signed)
HPI Noah Thompson is a very pleasant 82 yo man with a h/o symptomatic brady s/p PPM insertion. He is approaching ERI. He has not had chest pain or sob. He feels well. He remains active. No syncope. 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 His EF is 30-35%.       Allergies  Allergen Reactions   Coreg [Carvedilol] Rash   Lisinopril Rash   Tessalon [Benzonatate] Other (See Comments)      abd pain              Current Outpatient Medications  Medication Sig Dispense Refill   acetaminophen (TYLENOL) 500 MG tablet Take 500-1,000 mg by mouth every 6 (six) hours as needed (for pain.).       alum & mag hydroxide-simeth (MAALOX/MYLANTA) 200-200-20 MG/5ML suspension Take 15 mLs by mouth every 6 (six) hours as needed for indigestion or heartburn.       loperamide (IMODIUM) 2 MG capsule Take 1 capsule (2 mg total) by mouth 4 (four) times daily as needed for diarrhea or loose stools.       loratadine (CLARITIN) 10 MG tablet Take 1 tablet (10 mg total) by mouth daily as needed for allergies.       metoprolol succinate (TOPROL-XL) 50 MG 24 hr tablet Take 1 tablet by mouth once daily 90 tablet 2   Misc Natural Products (IMMUNE FORMULA PO) Take 2 tablets by mouth in the morning. Vitafusion Triple Immune Power Gummy Vitamins       omeprazole (PRILOSEC) 40 MG capsule Take 1 capsule (40 mg total) by mouth daily. Take daily for 3 days then as needed (Patient taking differently: Take 40 mg by mouth daily as needed (indigestion/stomach pain.).) 30 capsule 3   sacubitril-valsartan (ENTRESTO) 97-103 MG Take 1 tablet by mouth 2 (two) times daily. 180 tablet 1   warfarin (COUMADIN) 5 MG tablet TAKE 1 TABLET BY MOUTH DAILY EXCEPT TAKE 1/2 TABLET ON SUNDAYS, TUESDAYS AND THURSDAYS OR AS DIRECTED BY ANTICOAGULATION CLINIC 110 tablet 1    No current facility-administered medications for this visit.            Past Medical History:  Diagnosis Date   CAP (community acquired  pneumonia) 07/25/2017   Cataract      left eye   CHF (congestive heart failure) ([place])     Complete heart block ([place])      a. 1993 s/p PPM 2/2 perioperative heart block;  b. 01/2012 s/p SJM Accent DC PPM upgrade.   Depression     GERD (gastroesophageal reflux disease)     History of nephritis      a. as child.   History of pneumonia     Hyperlipidemia     Hypertension     NICM (nonischemic cardiomyopathy) ([place])      a. 06/2012 Echo: EF 45-50%;  b. 08/2013 Echo: EF 20-25%;  c. 10/2013 Adenosine MV: Fixed septal defect likely representing LBBB-related artifact, no reversible ischemia, EF 43%, low risk.   Paroxysmal atrial tachycardia      a. 07/2013 noted on device interrogation.   Presence of permanent cardiac pacemaker 2013   RA (rheumatoid arthritis) ([place])     S/P aortic valve replacement      a. 1993 s/p SJM mechanical AVR for aortic stenosis - chronic coumadin.   Shortness of breath dyspnea        ROS:    All systems reviewed and  negative except as noted in the HPI.          Past Surgical History:  Procedure Laterality Date   ANAL FISSURE REPAIR   1970s   AORTIC VALVE REPLACEMENT   1993    St. Jude Mechanical Valve   CATARACT EXTRACTION, BILATERAL Bilateral 02/2020    1 wk apart   INSERT / REPLACE / REMOVE PACEMAKER       PACEMAKER INSERTION   1993    for AV block, not pacemaker dependant   PERMANENT PACEMAKER GENERATOR CHANGE N/A 01/25/2012    Procedure: PERMANENT PACEMAKER GENERATOR CHANGE;  Surgeon: Thompson Grayer, MD;  Location[facility] C[facility] LAB;  Service: Cardiovascular;  Laterality: N/A;   TEE WITHOUT CARDIOVERSION N/A 08/08/2021    Procedure: TRANSESOPHAGEAL ECHOCARDIOGRAM (TEE);  Surgeon: Werner Lean, MD;  Location: [facility] ENDOSCOPY;  Service: Cardiovascular;  Laterality: N/A;  TAVR CT BEFORE TEE             Family History  Problem Relation Age of Onset   Arthritis Mother     Hypertension Mother     Arthritis Father     Heart disease Father     Prostate cancer  Father 52   Hypertension Father     Congestive Heart Failure Father     Coronary artery disease Neg Hx     Stroke Neg Hx     Diabetes Neg Hx          Social History         Socioeconomic History   Marital status: Married      Spouse name: Not on file   Number of children: 1   Years of education: Not on file   Highest education level: Not on file  Occupational History   Occupation: retired  Tobacco Use   Smoking status: Never   Smokeless tobacco: Never  Vaping Use   Vaping Use: Never used  Substance and Sexual Activity   Alcohol use: No   Drug use: No   Sexual activity: Not Currently  Other Topics Concern   Not on file  Social History Narrative    Lives with wife, 5 dogs.  grown children away.    Occupation: retired, was Pension scheme manager    Activity: welding    Diet: low sodium diet, good water, fruits/vegetables daily    Social Determinants of Adult nurse Strain: Not on file  Food Insecurity: Not on file  Transportation Needs: Not on file  Physical Activity: Not on file  Stress: Not on file  Social Connections: Not on file  Intimate Partner Violence: Not on file        BP 138/72   Pulse (!) 57   Ht 5\' 11"  (1.803 m)   Wt 180 lb (81.6 kg)   SpO2 96%   BMI 25.10 kg/m    Physical Exam:   Well appearing NAD HEENT: Unremarkable Neck:  No JVD, no thyromegally Lymphatics:  No adenopathy Back:  No CVA tenderness Lungs:  Clear with no wheezes HEART:  Regular rate rhythm, 3/6 systolic murmurs, no rubs, no clicks Abd:  soft, positive bowel sounds, no organomegally, no rebound, no guarding Ext:  2 plus pulses, no edema, no cyanosis, no clubbing Skin:  No rashes no nodules Neuro:  CN II through XII intact, motor grossly intact   EKG - nsr with LBBB   DEVICE  Normal device function.  See PaceArt for details. Approaching ERI   Assess/Plan:  CHB - he is  now conducting albeit with LBBB Chronic systolic heart failure - his symptoms are mild. His  EF is down. We will consider adding an LV lead if his vein is patent.  AS - he is s/p mechanical valve and probably not a good candidate for a redo. His gradient by echo was 26 by TEE and 33 by TTE.  PPM - his St. Jude DDD PM is working normally. He is pacing about a 1/3 of the time.   Carleene Overlie Emilo Gras,MD

## 2022-07-07 ENCOUNTER — Encounter (HOSPITAL_COMMUNITY): Payer: Self-pay | Admitting: Internal Medicine

## 2022-07-14 ENCOUNTER — Other Ambulatory Visit: Payer: Self-pay | Admitting: Family Medicine

## 2022-07-14 NOTE — Telephone Encounter (Signed)
Kenalog cream Last filled: 06/13/22, #80 g Last OV: 05/23/22, skin rash Next OV: none

## 2022-07-15 NOTE — Telephone Encounter (Signed)
He's going through 80gm tube monthly - this is too much. How is he using topical steroid? Should be for no more than 2 wk course at a time due to concern over skin changes with chronic steroid use.  I saw him 05/2022 at that time referred to derm - when is derm appt scheduled for?

## 2022-07-17 NOTE — Telephone Encounter (Signed)
Spoke with pt's wife, Jana Half (on dpr), asking how much cream is pt using. States he uses when skin is irritated but not daily. However, pt puts it on very thick. I relayed Dr. Synthia Innocent message. She verbalizes understanding and will inform pt. States they were contacted back in Nov 2023 for derm appt. Then got a call appt would need to be changed to Jan/Feb 2024 but hasn't heard anything else. I provided phn # [phone] for [facility]. Says she will call to get pt scheduled.

## 2022-07-19 ENCOUNTER — Ambulatory Visit: Payer: Medicare Other | Attending: Cardiovascular Disease

## 2022-07-19 DIAGNOSIS — I428 Other cardiomyopathies: Secondary | ICD-10-CM

## 2022-07-19 DIAGNOSIS — I441 Atrioventricular block, second degree: Secondary | ICD-10-CM

## 2022-07-19 NOTE — Patient Instructions (Signed)
   After Your Pacemaker   Monitor your pacemaker site for redness, swelling, and drainage. Call the device clinic at [phone] if you experience these symptoms or fever/chills.  Your incision was closed with Steri-strips or staples:  You may shower 7 days after your procedure and wash your incision with soap and water. Avoid lotions, ointments, or perfumes over your incision until it is well-healed.  You have a small hematoma at your device site.  Monitor the area for increase in size, hardness or signs of drainage or infection.  If any concerns develop, call us at the device clinic immediately. It will take a few weeks for your hematoma to continue to soften and dissolve.  If this does not continue to heal, call and let us know.    You may use a hot tub or a pool after your wound check appointment if the incision is completely closed.  Do not lift, push or pull greater than 10 pounds with the affected arm until 6 weeks after your procedure. There are no other restrictions in arm movement after your wound check appointment. Until After February 15th.   You may drive, unless driving has been restricted by your healthcare providers.   Remote monitoring is used to monitor your pacemaker from home. This monitoring is scheduled every 91 days by our office. It allows [place] to keep an eye on the functioning of your device to ensure it is working properly. You will routinely see your Electrophysiologist annually (more often if necessary).

## 2022-07-20 ENCOUNTER — Ambulatory Visit (INDEPENDENT_AMBULATORY_CARE_PROVIDER_SITE_OTHER): Payer: Medicare Other

## 2022-07-20 DIAGNOSIS — Z7901 Long term (current) use of anticoagulants: Secondary | ICD-10-CM

## 2022-07-20 LAB — POCT INR: INR: 3.5 — AB (ref 2.0–3.0)

## 2022-07-20 NOTE — Progress Notes (Addendum)
Pt had S/P aortic valve replacement on 07/06/22 and was told to only hold warfarin 1 day. Pt is doing well after procedure.  Continue to take 1/2 tablet daily except take 1 tablet on Mondays, Wednesdays and Fridays. Recheck in 4 weeks.

## 2022-07-20 NOTE — Patient Instructions (Addendum)
Pre visit review using our clinic review tool, if applicable. No additional management support is needed unless otherwise documented below in the visit note.  Continue to take 1/2 tablet daily except take 1 tablet on Mondays, Wednesdays and Fridays. Recheck in 4 weeks.

## 2022-07-21 LAB — CUP PACEART INCLINIC DEVICE CHECK
Battery Remaining Longevity: 57 mo
Battery Remaining Longevity: 57 mo
Battery Voltage: 3.02 V
Battery Voltage: 3.02 V
Brady Statistic RA Percent Paced: 72 %
Brady Statistic RA Percent Paced: 72 %
Brady Statistic RV Percent Paced: 99 %
Brady Statistic RV Percent Paced: 99 %
Date Time Interrogation Session: 20240117122600
Date Time Interrogation Session: 20240117122600
Implantable Lead Connection Status: 753985
Implantable Lead Connection Status: 753985
Implantable Lead Connection Status: 753985
Implantable Lead Connection Status: 753985
Implantable Lead Connection Status: 753985
Implantable Lead Connection Status: 753985
Implantable Lead Implant Date: 19930223
Implantable Lead Implant Date: 19930223
Implantable Lead Implant Date: 20240104
Implantable Lead Implant Date: 19930223
Implantable Lead Implant Date: 19930223
Implantable Lead Implant Date: 20240104
Implantable Lead Location: 753858
Implantable Lead Location: 753859
Implantable Lead Location: 753860
Implantable Lead Location: 753858
Implantable Lead Location: 753859
Implantable Lead Location: 753860
Implantable Pulse Generator Implant Date: 20240104
Implantable Pulse Generator Implant Date: 20240104
Lead Channel Impedance Value: 450 Ohm
Lead Channel Impedance Value: 475 Ohm
Lead Channel Impedance Value: 475 Ohm
Lead Channel Impedance Value: 450 Ohm
Lead Channel Impedance Value: 475 Ohm
Lead Channel Impedance Value: 475 Ohm
Lead Channel Pacing Threshold Amplitude: 1 V
Lead Channel Pacing Threshold Amplitude: 1 V
Lead Channel Pacing Threshold Amplitude: 1.25 V
Lead Channel Pacing Threshold Amplitude: 1.25 V
Lead Channel Pacing Threshold Amplitude: 1.25 V
Lead Channel Pacing Threshold Amplitude: 1.25 V
Lead Channel Pacing Threshold Amplitude: 1 V
Lead Channel Pacing Threshold Amplitude: 1 V
Lead Channel Pacing Threshold Amplitude: 1.25 V
Lead Channel Pacing Threshold Amplitude: 1.25 V
Lead Channel Pacing Threshold Amplitude: 1.25 V
Lead Channel Pacing Threshold Amplitude: 1.25 V
Lead Channel Pacing Threshold Pulse Width: 0.5 ms
Lead Channel Pacing Threshold Pulse Width: 0.5 ms
Lead Channel Pacing Threshold Pulse Width: 0.5 ms
Lead Channel Pacing Threshold Pulse Width: 0.5 ms
Lead Channel Pacing Threshold Pulse Width: 0.7 ms
Lead Channel Pacing Threshold Pulse Width: 0.7 ms
Lead Channel Pacing Threshold Pulse Width: 0.5 ms
Lead Channel Pacing Threshold Pulse Width: 0.5 ms
Lead Channel Pacing Threshold Pulse Width: 0.5 ms
Lead Channel Pacing Threshold Pulse Width: 0.5 ms
Lead Channel Pacing Threshold Pulse Width: 0.7 ms
Lead Channel Pacing Threshold Pulse Width: 0.7 ms
Lead Channel Sensing Intrinsic Amplitude: 12 mV
Lead Channel Sensing Intrinsic Amplitude: 3.9 mV
Lead Channel Sensing Intrinsic Amplitude: 12 mV
Lead Channel Sensing Intrinsic Amplitude: 3.9 mV
Lead Channel Setting Pacing Amplitude: 2.5 V
Lead Channel Setting Pacing Amplitude: 2.5 V
Lead Channel Setting Pacing Amplitude: 3.5 V
Lead Channel Setting Pacing Amplitude: 2.5 V
Lead Channel Setting Pacing Amplitude: 2.5 V
Lead Channel Setting Pacing Amplitude: 3.5 V
Lead Channel Setting Pacing Pulse Width: 0.5 ms
Lead Channel Setting Pacing Pulse Width: 0.5 ms
Lead Channel Setting Pacing Pulse Width: 0.5 ms
Lead Channel Setting Pacing Pulse Width: 0.5 ms
Lead Channel Setting Sensing Sensitivity: 2 mV
Lead Channel Setting Sensing Sensitivity: 2 mV
Pulse Gen Model: 3562
Pulse Gen Model: 3562
Pulse Gen Serial Number: 8134139
Pulse Gen Serial Number: 8134139

## 2022-07-21 NOTE — Progress Notes (Addendum)
Wound/CRT-P check appointment. Steri-strips removed. Wound without redness or edema. Incision edges well approximated.  Small, soft hematoma at incision site that is healing.  No s/s of infection. Patient given hematoma monitoring instructions and to call us if it does not continue to absorb over next few weeks and/or if other concerns develop as outlined in patient instructions.   Normal device function. Bi ventriculary pacing 99%. Thresholds, sensing, and impedances consistent with implant measurements. Device programmed at 2.5v at .22ms for RA and RV and 3.5V @ 0.5 for LV at implant for safety margin, reviewed with Gaspar Bidding, ST jude. Histogram distribution appropriate for patient and level of activity. AT/AF burden <1%, 1 AT episode arate 185bpm max with Vrate 70bpm. No high ventricular rates noted. Patient educated about wound care, arm mobility, lifting restrictions. ROV in 3 months with implanting physician.

## 2022-08-03 ENCOUNTER — Other Ambulatory Visit (HOSPITAL_COMMUNITY): Payer: Medicare Other

## 2022-08-08 DIAGNOSIS — L853 Xerosis cutis: Secondary | ICD-10-CM | POA: Diagnosis not present

## 2022-08-08 DIAGNOSIS — L239 Allergic contact dermatitis, unspecified cause: Secondary | ICD-10-CM | POA: Diagnosis not present

## 2022-08-08 DIAGNOSIS — L821 Other seborrheic keratosis: Secondary | ICD-10-CM | POA: Diagnosis not present

## 2022-08-08 DIAGNOSIS — L309 Dermatitis, unspecified: Secondary | ICD-10-CM | POA: Diagnosis not present

## 2022-08-17 ENCOUNTER — Ambulatory Visit: Payer: Medicare PPO

## 2022-08-17 DIAGNOSIS — Z7901 Long term (current) use of anticoagulants: Secondary | ICD-10-CM

## 2022-08-17 LAB — POCT INR: INR: 2.3 (ref 2.0–3.0)

## 2022-08-17 NOTE — Progress Notes (Signed)
Pt is starting a prednisone taper, starting at 60 mg, for 5 days. So pt will return next week for INR check to monitor if prednisone is interacting with warfarin.  Increase dose today to take 1 tablet and then continue 1/2 tablet daily except take 1 tablet on Mondays, Wednesdays and Fridays. Recheck in 1 weeks.

## 2022-08-17 NOTE — Patient Instructions (Addendum)
Pre visit review using our clinic review tool, if applicable. No additional management support is needed unless otherwise documented below in the visit note.  Increase dose today to take 1 tablet and then continue 1/2 tablet daily except take 1 tablet on Mondays, Wednesdays and Fridays. Recheck in 1 weeks.

## 2022-08-22 ENCOUNTER — Other Ambulatory Visit (HOSPITAL_COMMUNITY): Payer: Self-pay

## 2022-08-24 ENCOUNTER — Ambulatory Visit (INDEPENDENT_AMBULATORY_CARE_PROVIDER_SITE_OTHER): Payer: Medicare PPO

## 2022-08-24 DIAGNOSIS — Z7901 Long term (current) use of anticoagulants: Secondary | ICD-10-CM | POA: Diagnosis not present

## 2022-08-24 LAB — POCT INR: INR: 3 (ref 2.0–3.0)

## 2022-08-24 NOTE — Progress Notes (Signed)
Pt  taking long course of prednisone taper. He is in the middle of the taper now and should have about 1 more week. Does not seem to be interacting with warfarin.  Continue 1/2 tablet daily except take 1 tablet on Mondays, Wednesdays and Fridays. Recheck in 4 weeks.

## 2022-08-24 NOTE — Patient Instructions (Addendum)
Pre visit review using our clinic review tool, if applicable. No additional management support is needed unless otherwise documented below in the visit note.  Continue 1/2 tablet daily except take 1 tablet on Mondays, Wednesdays and Fridays. Recheck in 4 weeks.

## 2022-08-30 ENCOUNTER — Telehealth: Payer: Self-pay

## 2022-08-30 NOTE — Telephone Encounter (Signed)
**Note De-Identified Keyler Hoge Obfuscation** The pts wife called straight through on my personal cell phone this morning wanting advise on what to do with the providers page of the pts NPAF application for [facility] assistance.  I asked her if her call was transferred to me and she stated no, a nice man gave me your direct phone number when I called the office yesterday afternoon so I could call you directly this morning. I did request that she call the office phone number going forward as this is my personal phone.  I advised her that she can e-mail/fax the providers page to me but she stated that she would rather drop it off at Dr Oralia Rud office at [facility] [address] in [place].  She thanked me for taking her call.

## 2022-08-31 NOTE — Telephone Encounter (Signed)
**Note De-Identified Ikaika Showers Obfuscation** Jana Half, the pts wife is aware that I have completed the providers page of the pts [employer] Pt Asst application for [facility] and have given it to Dr Gasper Sells 's nurse so she can obtain his signature, date it, and to then fax to NPAF at the fax number circled at the bottom of the page.  Jana Half states that they have already faxed the pts part of his application to NPAF themselves.

## 2022-09-01 NOTE — Telephone Encounter (Signed)
Pt assistance form signed by MD and faxed to [phone].

## 2022-09-13 NOTE — Telephone Encounter (Signed)
**Note De-Identified Noah Thompson Obfuscation** Letter received from NPAF [name] fax stating that thy have approved the pt for Entrest[employer] assistance until 07/03/2023. Pt ID: [id number]  The letter states that they have notified the pt of this approval as well.

## 2022-09-14 ENCOUNTER — Telehealth: Payer: Self-pay

## 2022-09-14 DIAGNOSIS — Z7901 Long term (current) use of anticoagulants: Secondary | ICD-10-CM

## 2022-09-14 MED ORDER — WARFARIN SODIUM 5 MG PO TABS: ORAL_TABLET | ORAL | 1 refills | Status: DC

## 2022-09-14 NOTE — Telephone Encounter (Signed)
Fax received requesting refill of warfarin.  Pt is compliant with warfarin management and PCP apts.  Sent in refill of warfarin to requested pharmacy.

## 2022-09-19 ENCOUNTER — Telehealth: Payer: Self-pay | Admitting: Family Medicine

## 2022-09-19 DIAGNOSIS — L2089 Other atopic dermatitis: Secondary | ICD-10-CM | POA: Diagnosis not present

## 2022-09-19 NOTE — Telephone Encounter (Signed)
Patient would like a phone call to reschedule coumadin clinic on Thursday 09/21/2022

## 2022-09-19 NOTE — Telephone Encounter (Signed)
RS apt for next week. Pt has conflicting dentist apt this Thursday. [name] verbalized understanding.

## 2022-09-21 ENCOUNTER — Ambulatory Visit: Payer: Medicare PPO

## 2022-09-25 ENCOUNTER — Telehealth (HOSPITAL_COMMUNITY): Payer: Self-pay | Admitting: Internal Medicine

## 2022-09-25 NOTE — Telephone Encounter (Signed)
Patients spouse called and cancelled Echocardiogram for patient. Patient has decided he does not want . Order will be removed from the echo WQ. Thank you.

## 2022-09-27 ENCOUNTER — Ambulatory Visit (HOSPITAL_COMMUNITY): Payer: Medicare PPO

## 2022-09-28 ENCOUNTER — Ambulatory Visit (INDEPENDENT_AMBULATORY_CARE_PROVIDER_SITE_OTHER): Payer: Medicare PPO

## 2022-09-28 DIAGNOSIS — Z7901 Long term (current) use of anticoagulants: Secondary | ICD-10-CM

## 2022-09-28 LAB — POCT INR: INR: 2.4 (ref 2.0–3.0)

## 2022-09-28 NOTE — Patient Instructions (Addendum)
Pre visit review using our clinic review tool, if applicable. No additional management support is needed unless otherwise documented below in the visit note.  Increase dose today to take 1 tablet and the continue 1/2 tablet daily except take 1 tablet on Mondays, Wednesdays and Fridays. Recheck in 4 weeks.

## 2022-09-28 NOTE — Progress Notes (Signed)
Increase dose today to take 1 tablet and the continue 1/2 tablet daily except take 1 tablet on Mondays, Wednesdays and Fridays. Recheck in 4 weeks.

## 2022-10-06 ENCOUNTER — Ambulatory Visit (INDEPENDENT_AMBULATORY_CARE_PROVIDER_SITE_OTHER): Payer: Medicare PPO

## 2022-10-06 DIAGNOSIS — I428 Other cardiomyopathies: Secondary | ICD-10-CM | POA: Diagnosis not present

## 2022-10-06 LAB — CUP PACEART REMOTE DEVICE CHECK
Battery Remaining Longevity: 62 mo
Battery Remaining Percentage: 95.5 %
Battery Voltage: 2.98 V
Brady Statistic AP VP Percent: 85 %
Brady Statistic AP VS Percent: 1 %
Brady Statistic AS VP Percent: 13 %
Brady Statistic AS VS Percent: 1 %
Brady Statistic RA Percent Paced: 84 %
Date Time Interrogation Session: 20240405025246
Implantable Lead Connection Status: 753985
Implantable Lead Connection Status: 753985
Implantable Lead Connection Status: 753985
Implantable Lead Implant Date: 19930223
Implantable Lead Implant Date: 19930223
Implantable Lead Implant Date: 20240104
Implantable Lead Location: 753858
Implantable Lead Location: 753859
Implantable Lead Location: 753860
Implantable Pulse Generator Implant Date: 20240104
Lead Channel Impedance Value: 490 Ohm
Lead Channel Impedance Value: 490 Ohm
Lead Channel Impedance Value: 600 Ohm
Lead Channel Pacing Threshold Amplitude: 1 V
Lead Channel Pacing Threshold Amplitude: 1.25 V
Lead Channel Pacing Threshold Amplitude: 1.25 V
Lead Channel Pacing Threshold Pulse Width: 0.5 ms
Lead Channel Pacing Threshold Pulse Width: 0.5 ms
Lead Channel Pacing Threshold Pulse Width: 0.7 ms
Lead Channel Sensing Intrinsic Amplitude: 12 mV
Lead Channel Sensing Intrinsic Amplitude: 3 mV
Lead Channel Setting Pacing Amplitude: 2.5 V
Lead Channel Setting Pacing Amplitude: 2.5 V
Lead Channel Setting Pacing Amplitude: 3.5 V
Lead Channel Setting Pacing Pulse Width: 0.5 ms
Lead Channel Setting Pacing Pulse Width: 0.5 ms
Lead Channel Setting Sensing Sensitivity: 2 mV
Pulse Gen Model: 3562
Pulse Gen Serial Number: 8134139

## 2022-10-11 ENCOUNTER — Ambulatory Visit: Payer: Medicare PPO | Attending: Internal Medicine | Admitting: Internal Medicine

## 2022-10-11 VITALS — BP 136/70 | HR 60 | Resp 20 | Wt 189.0 lb

## 2022-10-11 DIAGNOSIS — I428 Other cardiomyopathies: Secondary | ICD-10-CM

## 2022-10-11 DIAGNOSIS — R001 Bradycardia, unspecified: Secondary | ICD-10-CM

## 2022-10-11 DIAGNOSIS — Z950 Presence of cardiac pacemaker: Secondary | ICD-10-CM | POA: Diagnosis not present

## 2022-10-11 NOTE — Patient Instructions (Signed)
Medication Instructions:  Your physician recommends that you continue on your current medications as directed. Please refer to the Current Medication list given to you today.  *If you need a refill on your cardiac medications before your next appointment, please call your pharmacy*  Lab Work: None ordered.  If you have labs (blood work) drawn today and your tests are completely normal, you will receive your results only by: MyChart Message (if you have MyChart) OR A paper copy in the mail If you have any lab test that is abnormal or we need to change your treatment, we will call you to review the results.  Testing/Procedures: None ordered.  Follow-Up: At [facility], you and your health needs are our priority.  As part of our continuing mission to provide you with exceptional heart care, we have created designated Provider Care Teams.  These Care Teams include your primary Cardiologist (physician) and Advanced Practice Providers (APPs -  Physician Assistants and Nurse Practitioners) who all work together to provide you with the care you need, when you need it.  We recommend signing up for the patient portal called "MyChart".  Sign up information is provided on this After Visit Summary.  MyChart is used to connect with patients for Virtual Visits (Telemedicine).  Patients are able to view lab/test results, encounter notes, upcoming appointments, etc.  Non-urgent messages can be sent to your provider as well.   To learn more about what you can do with MyChart, go to [email or web address].    Your next appointment:   1 year(s)  The format for your next appointment:   In Person  Provider:   Lewayne Bunting, MD{or one of the following Advanced Practice Providers on your designated Care Team:   Francis Dowse, [place] Casimiro Needle "Mardelle Matte" Lanna Poche, [place]  Remote monitoring is used to monitor your Pacemaker from home. This monitoring reduces the number of office visits required to check your device to  one time per year. It allows [place] to keep an eye on the functioning of your device to ensure it is working properly. You are scheduled for a device check from home on 01/05/23. You may send your transmission at any time that day. If you have a wireless device, the transmission will be sent automatically. After your physician reviews your transmission, you will receive a postcard with your next transmission date.

## 2022-10-11 NOTE — Progress Notes (Signed)
HPI Noah Thompson is a very pleasant 82 yo man with a h/o symptomatic brady s/p PPM insertion. He underwent PM gen change out 3 months ago. He has not had chest pain or sob. He feels well. He remains active. No syncope. 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 His EF is 30-35%.  Allergies  Allergen Reactions   Coreg [Carvedilol] Rash   Lisinopril Rash   Tessalon [Benzonatate] Other (See Comments)    abd pain     Current Outpatient Medications  Medication Sig Dispense Refill   acetaminophen (TYLENOL) 500 MG tablet Take 500-1,000 mg by mouth every 6 (six) hours as needed (for pain.).     alum & mag hydroxide-simeth (MAALOX/MYLANTA) 200-200-20 MG/5ML suspension Take 15 mLs by mouth every 6 (six) hours as needed for indigestion or heartburn.     amoxicillin (AMOXIL) 500 MG capsule Take 2,000 mg by mouth See admin instructions. Take 4 capsules (2000 mg) by mouth 1 hour prior to dental appointment     cyanocobalamin (V-R VITAMIN B-12) 500 MCG tablet Take 1 tablet (500 mcg total) by mouth in the morning and at bedtime.     diphenhydrAMINE (BENADRYL) 25 MG tablet Take 25-50 mg by mouth daily as needed for allergies.     famotidine (PEPCID) 20 MG tablet Take 1 tablet (20 mg total) by mouth at bedtime.     guaiFENesin (MUCINEX) 600 MG 12 hr tablet Take 600 mg by mouth 2 (two) times daily as needed (congestion/cough).     loperamide (IMODIUM) 2 MG capsule Take 1 capsule (2 mg total) by mouth 4 (four) times daily as needed for diarrhea or loose stools.     loratadine (CLARITIN) 10 MG tablet Take 1 tablet (10 mg total) by mouth daily as needed for allergies.     metoprolol succinate (TOPROL-XL) 50 MG 24 hr tablet Take 1 tablet by mouth once daily 30 tablet 10   Misc Natural Products (IMMUNE FORMULA PO) Take 1-2 tablets by mouth in the morning. Vitafusion Triple Immune Power Gummy Vitamins     omeprazole (PRILOSEC) 40 MG capsule Take 1 capsule (40 mg total) by mouth daily.  Take daily for 3 days then as needed 30 capsule 3   predniSONE (DELTASONE) 20 MG tablet Take two tablets daily for 3 days followed by one tablet daily for 4 days 10 tablet 0   sacubitril-valsartan (ENTRESTO) 97-103 MG Take 1 tablet by mouth 2 (two) times daily. 180 tablet 3   triamcinolone cream (KENALOG) 0.1 % Apply 1 Application topically in the morning and at bedtime. For max 2 weeks at a time 80 g 0   warfarin (COUMADIN) 5 MG tablet TAKE /2 TABLET BY MOUTH DAILY EXCEPT TAKE 1 TABLET ON MONDAYS, WEDNESDAYS AND FRIDAYS OR AS DIRECTED BY ANTICOAGULATION CLINIC 110 tablet 1   No current facility-administered medications for this visit.     Past Medical History:  Diagnosis Date   CAP (community acquired pneumonia) 07/25/2017   Cataract    left eye   CHF (congestive heart failure) (HCC)    Complete heart block (HCC)    a. 1993 s/p PPM 2/2 perioperative heart block;  b. 01/2012 s/p SJM Accent DC PPM upgrade.   Depression    GERD (gastroesophageal reflux disease)    History of nephritis    a. as child.   History of pneumonia    Hyperlipidemia    Hypertension    NICM (nonischemic cardiomyopathy) (HCC)  a. 06/2012 Echo: EF 45-50%;  b. 08/2013 Echo: EF 20-25%;  c. 10/2013 Adenosine MV: Fixed septal defect likely representing LBBB-related artifact, no reversible ischemia, EF 43%, low risk.   Paroxysmal atrial tachycardia    a. 07/2013 noted on device interrogation.   Presence of permanent cardiac pacemaker 2013   RA (rheumatoid arthritis) (HCC)    S/P aortic valve replacement    a. 1993 s/p SJM mechanical AVR for aortic stenosis - chronic coumadin.   Shortness of breath dyspnea     ROS:   All systems reviewed and negative except as noted in the HPI.   Past Surgical History:  Procedure Laterality Date   ANAL FISSURE REPAIR  1970s   AORTIC VALVE REPLACEMENT  1993   St. Jude Mechanical Valve   CATARACT EXTRACTION, BILATERAL Bilateral 02/2020   1 wk apart   INSERT / REPLACE / REMOVE  PACEMAKER     PACEMAKER INSERTION  1993   for AV block, not pacemaker dependant   PERMANENT PACEMAKER GENERATOR CHANGE N/A 01/25/2012   Procedure: PERMANENT PACEMAKER GENERATOR CHANGE;  Surgeon: [name], MD;  Location: [facility] CATH LAB;  Service: Cardiovascular;  Laterality: N/A;   PPM GENERATOR CHANGEOUT N/A 07/06/2022   Procedure: PPM GENERATOR CHANGEOUT;  Surgeon: Marinus Maw, MD;  Location: [facility] INVASIVE CV LAB;  Service: Cardiovascular;  Laterality: N/A;   TEE WITHOUT CARDIOVERSION N/A 08/08/2021   Procedure: TRANSESOPHAGEAL ECHOCARDIOGRAM (TEE);  Surgeon: Christell Constant, MD;  Location: [facility] ENDOSCOPY;  Service: Cardiovascular;  Laterality: N/A;  TAVR CT BEFORE TEE     Family History  Problem Relation Age of Onset   Arthritis Mother    Hypertension Mother    Arthritis Father    Heart disease Father    Prostate cancer Father 29   Hypertension Father    Congestive Heart Failure Father    Coronary artery disease Neg Hx    Stroke Neg Hx    Diabetes Neg Hx      Social History   Socioeconomic History   Marital status: Married    Spouse name: Not on file   Number of children: 1   Years of education: Not on file   Highest education level: Not on file  Occupational History   Occupation: retired  Tobacco Use   Smoking status: Never   Smokeless tobacco: Never  Vaping Use   Vaping Use: Never used  Substance and Sexual Activity   Alcohol use: No   Drug use: No   Sexual activity: Not Currently  Other Topics Concern   Not on file  Social History Narrative   Lives with wife, 5 dogs.  grown children away.   Occupation: retired, was Teaching laboratory technician   Activity: welding   Diet: low sodium diet, good water, fruits/vegetables daily   Social Determinants of Health   Financial Resource Strain: Not on file  Food Insecurity: Not on file  Transportation Needs: Not on file  Physical Activity: Not on file  Stress: Not on file  Social Connections: Not on file  Intimate Partner  Violence: Not on file     There were no vitals taken for this visit.  Physical Exam:  Well appearing NAD HEENT: Unremarkable Neck:  No JVD, no thyromegally Lymphatics:  No adenopathy Back:  No CVA tenderness Lungs:  Clear HEART:  Regular rate rhythm, no murmurs, no rubs, no clicks Abd:  soft, positive bowel sounds, no organomegally, no rebound, no guarding Ext:  2 plus pulses, no edema, no cyanosis, no  clubbing Skin:  No rashes no nodules Neuro:  CN II through XII intact, motor grossly intact  EKG - AV sequential biv pacing  DEVICE  Normal device function.  See PaceArt for details.   Assess/Plan:  CHB - he is now biv pacing Chronic systolic heart failure - his symptoms are mild. His EF is down. He has undergone biv upgrade. AS - he is s/p mechanical valve and probably not a good candidate for a redo. His gradient by echo was 26 by TEE and 33 by TTE.  PPM - his St. Jude DDD PM is working normally. He is pacing about a 1/3 of the time.   Sharlot GowdaGregg Ainara Eldridge,MD

## 2022-10-26 ENCOUNTER — Ambulatory Visit (INDEPENDENT_AMBULATORY_CARE_PROVIDER_SITE_OTHER): Payer: Medicare PPO

## 2022-10-26 DIAGNOSIS — Z7901 Long term (current) use of anticoagulants: Secondary | ICD-10-CM

## 2022-10-26 LAB — POCT INR: INR: 2.9 (ref 2.0–3.0)

## 2022-10-26 NOTE — Progress Notes (Signed)
Continue 1/2 tablet daily except take 1 tablet on Mondays, Wednesdays and Fridays. Recheck in 4 weeks.

## 2022-10-26 NOTE — Patient Instructions (Addendum)
Pre visit review using our clinic review tool, if applicable. No additional management support is needed unless otherwise documented below in the visit note.  Continue 1/2 tablet daily except take 1 tablet on Mondays, Wednesdays and Fridays. Recheck in 4 weeks. 

## 2022-11-07 NOTE — Progress Notes (Signed)
Remote pacemaker transmission.   

## 2022-11-14 ENCOUNTER — Other Ambulatory Visit: Payer: Self-pay | Admitting: Surgery

## 2022-11-14 DIAGNOSIS — I712 Thoracic aortic aneurysm, without rupture, unspecified: Secondary | ICD-10-CM

## 2022-11-23 ENCOUNTER — Ambulatory Visit (INDEPENDENT_AMBULATORY_CARE_PROVIDER_SITE_OTHER): Payer: Medicare PPO

## 2022-11-23 DIAGNOSIS — Z7901 Long term (current) use of anticoagulants: Secondary | ICD-10-CM

## 2022-11-23 LAB — POCT INR: INR: 3 (ref 2.0–3.0)

## 2022-11-23 NOTE — Patient Instructions (Addendum)
Pre visit review using our clinic review tool, if applicable. No additional management support is needed unless otherwise documented below in the visit note.  Continue 1/2 tablet daily except take 1 tablet on Mondays, Wednesdays and Fridays. Recheck in 4 weeks. 

## 2022-11-23 NOTE — Progress Notes (Signed)
Continue 1/2 tablet daily except take 1 tablet on Mondays, Wednesdays and Fridays. Recheck in 4 weeks. 

## 2022-12-21 ENCOUNTER — Ambulatory Visit: Payer: Medicare PPO

## 2022-12-22 ENCOUNTER — Telehealth: Payer: Self-pay

## 2022-12-22 NOTE — Telephone Encounter (Signed)
Pt NS coumadin clinic apt yesterday. LVM for pt to return call to RS apt.

## 2022-12-27 ENCOUNTER — Other Ambulatory Visit: Payer: Medicare PPO

## 2022-12-27 ENCOUNTER — Ambulatory Visit: Payer: Medicare PPO | Admitting: Surgery

## 2022-12-28 ENCOUNTER — Ambulatory Visit (INDEPENDENT_AMBULATORY_CARE_PROVIDER_SITE_OTHER): Payer: Medicare PPO

## 2022-12-28 DIAGNOSIS — Z7901 Long term (current) use of anticoagulants: Secondary | ICD-10-CM

## 2022-12-28 LAB — POCT INR: INR: 3.1 — AB (ref 2.0–3.0)

## 2022-12-28 NOTE — Progress Notes (Signed)
Continue 1/2 tablet daily except take 1 tablet on Mondays, Wednesdays and Fridays. Recheck in 4 weeks. 

## 2022-12-28 NOTE — Patient Instructions (Addendum)
Pre visit review using our clinic review tool, if applicable. No additional management support is needed unless otherwise documented below in the visit note.  Continue 1/2 tablet daily except take 1 tablet on Mondays, Wednesdays and Fridays. Recheck in 4 weeks. 

## 2023-01-05 ENCOUNTER — Ambulatory Visit (INDEPENDENT_AMBULATORY_CARE_PROVIDER_SITE_OTHER): Payer: Medicare PPO

## 2023-01-05 DIAGNOSIS — I441 Atrioventricular block, second degree: Secondary | ICD-10-CM

## 2023-01-05 LAB — CUP PACEART REMOTE DEVICE CHECK
Battery Remaining Longevity: 75 mo
Battery Remaining Percentage: 95 %
Battery Voltage: 2.99 V
Brady Statistic AP VP Percent: 86 %
Brady Statistic AP VS Percent: 1 %
Brady Statistic AS VP Percent: 12 %
Brady Statistic AS VS Percent: 1 %
Brady Statistic RA Percent Paced: 85 %
Date Time Interrogation Session: 20240705033317
Implantable Lead Connection Status: 753985
Implantable Lead Connection Status: 753985
Implantable Lead Connection Status: 753985
Implantable Lead Implant Date: 19930223
Implantable Lead Implant Date: 19930223
Implantable Lead Implant Date: 20240104
Implantable Lead Location: 753858
Implantable Lead Location: 753859
Implantable Lead Location: 753860
Implantable Pulse Generator Implant Date: 20240104
Lead Channel Impedance Value: 480 Ohm
Lead Channel Impedance Value: 490 Ohm
Lead Channel Impedance Value: 560 Ohm
Lead Channel Pacing Threshold Amplitude: 0.75 V
Lead Channel Pacing Threshold Amplitude: 1.25 V
Lead Channel Pacing Threshold Amplitude: 1.25 V
Lead Channel Pacing Threshold Pulse Width: 0.5 ms
Lead Channel Pacing Threshold Pulse Width: 0.5 ms
Lead Channel Pacing Threshold Pulse Width: 0.7 ms
Lead Channel Sensing Intrinsic Amplitude: 11.5 mV
Lead Channel Sensing Intrinsic Amplitude: 3.8 mV
Lead Channel Setting Pacing Amplitude: 2 V
Lead Channel Setting Pacing Amplitude: 2.5 V
Lead Channel Setting Pacing Amplitude: 2.5 V
Lead Channel Setting Pacing Pulse Width: 0.5 ms
Lead Channel Setting Pacing Pulse Width: 0.5 ms
Lead Channel Setting Sensing Sensitivity: 2 mV
Pulse Gen Model: 3562
Pulse Gen Serial Number: 8134139

## 2023-01-22 NOTE — Progress Notes (Signed)
Remote pacemaker transmission.   

## 2023-01-25 ENCOUNTER — Ambulatory Visit (INDEPENDENT_AMBULATORY_CARE_PROVIDER_SITE_OTHER): Payer: Medicare PPO

## 2023-01-25 DIAGNOSIS — Z7901 Long term (current) use of anticoagulants: Secondary | ICD-10-CM

## 2023-01-25 LAB — POCT INR: INR: 4.2 — AB (ref 2.0–3.0)

## 2023-01-25 NOTE — Progress Notes (Signed)
Pt has been eating cherries and cherry juice as well as a new drink that may contain cranberry.  Hold dose today and then change weekly dose to take 1/2 tablet daily except take 1 tablet on Mondays and Fridays. Recheck in 2 weeks.

## 2023-01-25 NOTE — Patient Instructions (Addendum)
Pre visit review using our clinic review tool, if applicable. No additional management support is needed unless otherwise documented below in the visit note.  Hold dose today and then change weekly dose to take 1/2 tablet daily except take 1 tablet on Mondays and Fridays. Recheck in 2 weeks.

## 2023-02-08 ENCOUNTER — Ambulatory Visit (INDEPENDENT_AMBULATORY_CARE_PROVIDER_SITE_OTHER): Payer: Medicare PPO

## 2023-02-08 DIAGNOSIS — Z7901 Long term (current) use of anticoagulants: Secondary | ICD-10-CM | POA: Diagnosis not present

## 2023-02-08 LAB — POCT INR: INR: 2.9 (ref 2.0–3.0)

## 2023-02-08 NOTE — Patient Instructions (Addendum)
Pre visit review using our clinic review tool, if applicable. No additional management support is needed unless otherwise documented below in the visit note.  Continue 1/2 tablet daily except take 1 tablet on Mondays and Fridays. Recheck in 5 weeks.

## 2023-02-08 NOTE — Progress Notes (Signed)
Continue 1/2 tablet daily except take 1 tablet on Mondays and Fridays. Recheck in 5 weeks.

## 2023-03-14 DIAGNOSIS — C44319 Basal cell carcinoma of skin of other parts of face: Secondary | ICD-10-CM | POA: Diagnosis not present

## 2023-03-14 DIAGNOSIS — L820 Inflamed seborrheic keratosis: Secondary | ICD-10-CM | POA: Diagnosis not present

## 2023-03-14 DIAGNOSIS — D485 Neoplasm of uncertain behavior of skin: Secondary | ICD-10-CM | POA: Diagnosis not present

## 2023-03-14 DIAGNOSIS — C44311 Basal cell carcinoma of skin of nose: Secondary | ICD-10-CM | POA: Diagnosis not present

## 2023-03-15 ENCOUNTER — Ambulatory Visit (INDEPENDENT_AMBULATORY_CARE_PROVIDER_SITE_OTHER): Payer: Medicare PPO

## 2023-03-15 DIAGNOSIS — Z7901 Long term (current) use of anticoagulants: Secondary | ICD-10-CM

## 2023-03-15 LAB — POCT INR: INR: 2.1 (ref 2.0–3.0)

## 2023-03-15 NOTE — Progress Notes (Addendum)
Pt reports taking a melatonin supplement for sleep. Melatonin will increase INR and not decrease it.  He also reports eating more vitamin K rich foods. Increase dose today to take 1 tablet and increase dose tomorrow to take 1 1/2 tablets and then continue 1/2 tablet daily except take 1 tablet on Mondays and Fridays. Recheck in 3 weeks.

## 2023-03-15 NOTE — Patient Instructions (Addendum)
Pre visit review using our clinic review tool, if applicable. No additional management support is needed unless otherwise documented below in the visit note.  Increase dose today to take 1 tablet and increase dose tomorrow to take 1 1/2 tablets and then continue 1/2 tablet daily except take 1 tablet on Mondays and Fridays. Recheck in 3 weeks.

## 2023-04-05 ENCOUNTER — Ambulatory Visit: Payer: Medicare PPO

## 2023-04-05 DIAGNOSIS — Z7901 Long term (current) use of anticoagulants: Secondary | ICD-10-CM | POA: Diagnosis not present

## 2023-04-05 LAB — POCT INR: INR: 2.3 (ref 2.0–3.0)

## 2023-04-05 NOTE — Patient Instructions (Addendum)
Pre visit review using our clinic review tool, if applicable. No additional management support is needed unless otherwise documented below in the visit note.  Increase dose today to take 1 tablet and then continue 1/2 tablet daily except take 1 tablet on Mondays and Fridays. Recheck in 2 weeks.

## 2023-04-05 NOTE — Progress Notes (Signed)
Pt has been eating a lot of beets, tomatoes and pumpkin in the last month. He did not know these contain high amounts of vitamin K. He reports he has been eating beets about every 2 days. Educated pt on vitamin K containing foods and provided pt with a book from [employer] with further information on foods that contain vitamin K and their amounts. Pt verbalized understanding.  Due to this, and pt stating he will stop eating the large quantity of these foods, there will not be a change in weekly dosing. Pt has been stable on this dosing in the past.  Increase dose today to take 1 tablet and then continue 1/2 tablet daily except take 1 tablet on Mondays and Fridays. Recheck in 2 weeks.

## 2023-04-06 ENCOUNTER — Ambulatory Visit (INDEPENDENT_AMBULATORY_CARE_PROVIDER_SITE_OTHER): Payer: Medicare PPO

## 2023-04-06 DIAGNOSIS — I441 Atrioventricular block, second degree: Secondary | ICD-10-CM

## 2023-04-06 LAB — CUP PACEART REMOTE DEVICE CHECK
Battery Remaining Longevity: 73 mo
Battery Remaining Percentage: 91 %
Battery Voltage: 2.99 V
Brady Statistic AP VP Percent: 86 %
Brady Statistic AP VS Percent: 1 %
Brady Statistic AS VP Percent: 12 %
Brady Statistic AS VS Percent: 1 %
Brady Statistic RA Percent Paced: 85 %
Date Time Interrogation Session: 20241004020015
Implantable Lead Connection Status: 753985
Implantable Lead Connection Status: 753985
Implantable Lead Connection Status: 753985
Implantable Lead Implant Date: 19930223
Implantable Lead Implant Date: 19930223
Implantable Lead Implant Date: 20240104
Implantable Lead Location: 753858
Implantable Lead Location: 753859
Implantable Lead Location: 753860
Implantable Pulse Generator Implant Date: 20240104
Lead Channel Impedance Value: 480 Ohm
Lead Channel Impedance Value: 510 Ohm
Lead Channel Impedance Value: 560 Ohm
Lead Channel Pacing Threshold Amplitude: 0.75 V
Lead Channel Pacing Threshold Amplitude: 1.25 V
Lead Channel Pacing Threshold Amplitude: 1.25 V
Lead Channel Pacing Threshold Pulse Width: 0.5 ms
Lead Channel Pacing Threshold Pulse Width: 0.5 ms
Lead Channel Pacing Threshold Pulse Width: 0.7 ms
Lead Channel Sensing Intrinsic Amplitude: 11.8 mV
Lead Channel Sensing Intrinsic Amplitude: 3.4 mV
Lead Channel Setting Pacing Amplitude: 2 V
Lead Channel Setting Pacing Amplitude: 2.5 V
Lead Channel Setting Pacing Amplitude: 2.5 V
Lead Channel Setting Pacing Pulse Width: 0.5 ms
Lead Channel Setting Pacing Pulse Width: 0.5 ms
Lead Channel Setting Sensing Sensitivity: 2 mV
Pulse Gen Model: 3562
Pulse Gen Serial Number: 8134139

## 2023-04-10 ENCOUNTER — Telehealth: Payer: Self-pay | Admitting: Internal Medicine

## 2023-04-10 MED ORDER — METOPROLOL SUCCINATE ER 50 MG PO TB24: 50.0000 mg | ORAL_TABLET | Freq: Every day | ORAL | 1 refills | Status: DC

## 2023-04-10 NOTE — Telephone Encounter (Signed)
Pt's medication was sent to pt's pharmacy as requested. Confirmation received.  °

## 2023-04-10 NOTE — Telephone Encounter (Signed)
*  STAT* If patient is at the pharmacy, call can be transferred to refill team.   1. Which medications need to be refilled? (please list name of each medication and dose if known) metoprolol succinate (TOPROL-XL) 50 MG 24 hr tablet    2. Would you like to learn more about the convenience, safety, & potential cost savings by using the [facility]? No      3. Are you open to using the [facility] (Type [facility]. No ).   4. Which pharmacy/location (including street and city if local pharmacy) is medication to be sent to? [facility] [address] [place], [place] - [address]    5. Do they need a 30 day or 90 day supply? 90

## 2023-04-13 NOTE — Progress Notes (Signed)
Remote pacemaker transmission.   

## 2023-04-18 DIAGNOSIS — C44319 Basal cell carcinoma of skin of other parts of face: Secondary | ICD-10-CM | POA: Diagnosis not present

## 2023-04-18 DIAGNOSIS — D485 Neoplasm of uncertain behavior of skin: Secondary | ICD-10-CM | POA: Diagnosis not present

## 2023-04-19 ENCOUNTER — Ambulatory Visit: Payer: Medicare PPO

## 2023-04-19 DIAGNOSIS — Z7901 Long term (current) use of anticoagulants: Secondary | ICD-10-CM | POA: Diagnosis not present

## 2023-04-19 LAB — POCT INR: INR: 2.8 (ref 2.0–3.0)

## 2023-04-19 NOTE — Patient Instructions (Addendum)
Pre visit review using our clinic review tool, if applicable. No additional management support is needed unless otherwise documented below in the visit note.  Continue 1/2 tablet daily except take 1 tablet on Mondays and Fridays. Recheck in 4 weeks.

## 2023-04-19 NOTE — Progress Notes (Signed)
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 Warfarin was not stopped. Pt is having some pain but doing well. Denies bleeding.  Continue 1/2 tablet daily except take 1 tablet on Mondays and Fridays. Recheck in 4 weeks.

## 2023-04-30 ENCOUNTER — Ambulatory Visit: Payer: Medicare PPO

## 2023-04-30 DIAGNOSIS — Z23 Encounter for immunization: Secondary | ICD-10-CM

## 2023-05-03 DIAGNOSIS — C44311 Basal cell carcinoma of skin of nose: Secondary | ICD-10-CM | POA: Diagnosis not present

## 2023-05-17 ENCOUNTER — Ambulatory Visit: Payer: Medicare PPO

## 2023-05-17 DIAGNOSIS — Z7901 Long term (current) use of anticoagulants: Secondary | ICD-10-CM | POA: Diagnosis not present

## 2023-05-17 LAB — POCT INR: INR: 2.1 (ref 2.0–3.0)

## 2023-05-17 NOTE — Progress Notes (Signed)
Increase does today to take 1 tablet and increase dose tomorrow to take 1 1/2 tablets and then change weekly dose to take 1/2 tablet daily except take 1 tablet on Mondays, Wednesdays and Fridays. Recheck in 3 weeks.

## 2023-05-17 NOTE — Patient Instructions (Addendum)
Pre visit review using our clinic review tool, if applicable. No additional management support is needed unless otherwise documented below in the visit note.  Increase does today to take 1 tablet and increase dose tomorrow to take 1 1/2 tablets and then change weekly dose to take 1/2 tablet daily except take 1 tablet on Mondays, Wednesdays and Fridays. Recheck in 3 weeks.

## 2023-05-29 ENCOUNTER — Telehealth: Payer: Self-pay | Admitting: Family Medicine

## 2023-05-29 NOTE — Telephone Encounter (Signed)
Contacted [name] and moved apt for later that day.

## 2023-05-29 NOTE — Telephone Encounter (Signed)
[name], [name], called to r/s the pt's visit for Coumadin with Carollee Herter from 12/5 to the next available date. [name] states the pt cannot make the date/time of the appt. Call back # [phone]

## 2023-06-06 ENCOUNTER — Telehealth: Payer: Self-pay | Admitting: Internal Medicine

## 2023-06-06 NOTE — Telephone Encounter (Signed)
[name] is calling to get some help on their patient assistance paperwork. Please advise

## 2023-06-07 ENCOUNTER — Ambulatory Visit: Payer: Medicare PPO

## 2023-06-07 DIAGNOSIS — Z7901 Long term (current) use of anticoagulants: Secondary | ICD-10-CM | POA: Diagnosis not present

## 2023-06-07 LAB — POCT INR: INR: 2.9 (ref 2.0–3.0)

## 2023-06-07 MED ORDER — WARFARIN SODIUM 5 MG PO TABS: ORAL_TABLET | ORAL | 1 refills | Status: DC

## 2023-06-07 NOTE — Progress Notes (Signed)
Continue 1/2 tablet daily except take 1 tablet on Mondays, Wednesdays and Fridays.  Recheck in 4 weeks.   Pt is compliant with warfarin management and PCP apts.  Sent in refill of warfarin to requested pharmacy.

## 2023-06-07 NOTE — Patient Instructions (Addendum)
Pre visit review using our clinic review tool, if applicable. No additional management support is needed unless otherwise documented below in the visit note.  Continue 1/2 tablet daily except take 1 tablet on Mondays, Wednesdays and Fridays. Recheck in 4 weeks. 

## 2023-06-08 ENCOUNTER — Telehealth: Payer: Self-pay

## 2023-06-08 ENCOUNTER — Other Ambulatory Visit (HOSPITAL_COMMUNITY): Payer: Self-pay

## 2023-06-08 NOTE — Telephone Encounter (Signed)
Patient needs provider portion completed for PAP. Dropping by the office next week. Form will need provider signature before it's forwarded to med assist team. Please reach out when documentation has been sent.

## 2023-06-08 NOTE — Telephone Encounter (Signed)
Patient is dropping forms off at office next week.

## 2023-06-12 NOTE — Telephone Encounter (Signed)
Paper work complete sent to [employer].

## 2023-06-12 NOTE — Telephone Encounter (Signed)
Wife [name]) called to follow-up on the status of patient's paperwork.

## 2023-06-12 NOTE — Telephone Encounter (Signed)
Paper Work Dropped Off:  Provider portion of patient assistance paperwork  Date: 06/12/23  Location of paper:  MD BOX

## 2023-06-12 NOTE — Telephone Encounter (Signed)
MD page complete with MD signature.  Page scanned to [employer].

## 2023-06-13 DIAGNOSIS — C44319 Basal cell carcinoma of skin of other parts of face: Secondary | ICD-10-CM | POA: Diagnosis not present

## 2023-06-13 NOTE — Telephone Encounter (Signed)
PAP: Application for Sherryll Burger has been submitted to PAP Companies: [employer], via fax. If patient requests status update in the meantime, please refer them to [phone]

## 2023-06-13 NOTE — Telephone Encounter (Signed)
Received, provider portion as been faxed to [employer].

## 2023-06-25 ENCOUNTER — Other Ambulatory Visit (HOSPITAL_COMMUNITY): Payer: Self-pay

## 2023-06-25 NOTE — Telephone Encounter (Signed)
PAP: Patient application pending due to COMPANY MISSING PROOF OF INCOME, COPY OF INSURANCE CARDS, AND PATIENT'S SIGNATURE ON AUTHORIZATION FORM. LETTER HAS BEEN MAILED TO PATIENT

## 2023-07-05 ENCOUNTER — Ambulatory Visit: Payer: Medicare PPO

## 2023-07-05 DIAGNOSIS — Z7901 Long term (current) use of anticoagulants: Secondary | ICD-10-CM

## 2023-07-05 LAB — POCT INR: INR: 3.3 — AB (ref 2.0–3.0)

## 2023-07-05 NOTE — Progress Notes (Signed)
Continue 1/2 tablet daily except take 1 tablet on Mondays, Wednesdays and Fridays. Recheck in 4 weeks. 

## 2023-07-05 NOTE — Patient Instructions (Addendum)
 Pre visit review using our clinic review tool, if applicable. No additional management support is needed unless otherwise documented below in the visit note.  Continue 1/2 tablet daily except take 1 tablet on Mondays, Wednesdays and Fridays. Recheck in 4 weeks.

## 2023-07-06 ENCOUNTER — Ambulatory Visit (INDEPENDENT_AMBULATORY_CARE_PROVIDER_SITE_OTHER): Payer: Medicare Other

## 2023-07-06 DIAGNOSIS — I441 Atrioventricular block, second degree: Secondary | ICD-10-CM

## 2023-07-06 LAB — CUP PACEART REMOTE DEVICE CHECK
Battery Remaining Longevity: 71 mo
Battery Remaining Percentage: 88 %
Battery Voltage: 2.98 V
Brady Statistic AP VP Percent: 86 %
Brady Statistic AP VS Percent: 1 %
Brady Statistic AS VP Percent: 12 %
Brady Statistic AS VS Percent: 1 %
Brady Statistic RA Percent Paced: 85 %
Date Time Interrogation Session: 20250103035904
Implantable Lead Connection Status: 753985
Implantable Lead Connection Status: 753985
Implantable Lead Connection Status: 753985
Implantable Lead Implant Date: 19930223
Implantable Lead Implant Date: 19930223
Implantable Lead Implant Date: 20240104
Implantable Lead Location: 753858
Implantable Lead Location: 753859
Implantable Lead Location: 753860
Implantable Pulse Generator Implant Date: 20240104
Lead Channel Impedance Value: 480 Ohm
Lead Channel Impedance Value: 510 Ohm
Lead Channel Impedance Value: 600 Ohm
Lead Channel Pacing Threshold Amplitude: 0.75 V
Lead Channel Pacing Threshold Amplitude: 1.25 V
Lead Channel Pacing Threshold Amplitude: 1.25 V
Lead Channel Pacing Threshold Pulse Width: 0.5 ms
Lead Channel Pacing Threshold Pulse Width: 0.5 ms
Lead Channel Pacing Threshold Pulse Width: 0.7 ms
Lead Channel Sensing Intrinsic Amplitude: 12 mV
Lead Channel Sensing Intrinsic Amplitude: 4.2 mV
Lead Channel Setting Pacing Amplitude: 2 V
Lead Channel Setting Pacing Amplitude: 2.5 V
Lead Channel Setting Pacing Amplitude: 2.5 V
Lead Channel Setting Pacing Pulse Width: 0.5 ms
Lead Channel Setting Pacing Pulse Width: 0.5 ms
Lead Channel Setting Sensing Sensitivity: 2 mV
Pulse Gen Model: 3562
Pulse Gen Serial Number: 8134139

## 2023-07-06 NOTE — Telephone Encounter (Signed)
 PAP: Patient assistance application for Entresto  has been approved by PAP Companies: [employer] from 07/04/22 to 07/05/23. Medication should be delivered to PAP Delivery: Home For further shipping updates, please contact [employer] at [phone] Pt ID is: [id number]

## 2023-07-12 ENCOUNTER — Telehealth: Payer: Self-pay

## 2023-07-12 NOTE — Telephone Encounter (Signed)
 Pt reports he missed warfarin dose last night. His current dosing is 1/2 tablet (2.5 mg) daily except take 1 tablet (5 mg) on Monday, Wednesday and Friday.  So, pt missed a full tablet yesterday. Advised to increase today to 1 full tablet and increase tomorrow to take 1 1/2 tablets. Pt verbalized understanding.

## 2023-08-02 ENCOUNTER — Ambulatory Visit: Payer: Medicare PPO

## 2023-08-02 DIAGNOSIS — Z7901 Long term (current) use of anticoagulants: Secondary | ICD-10-CM | POA: Diagnosis not present

## 2023-08-02 LAB — POCT INR: INR: 2.9 (ref 2.0–3.0)

## 2023-08-02 NOTE — Progress Notes (Addendum)
Pt reports he missed warfarin dose on 1/8. His current dosing is 1/2 tablet (2.5 mg) daily except take 1 tablet (5 mg) on Monday, Wednesday and Friday.   So, pt missed a full tablet yesterday. Advised to increase today to 1 full tablet and increase tomorrow to take 1 1/2 tablets. Pt verbalized understanding. Continue 1/2 tablet daily except take 1 tablet on Mondays, Wednesdays and Fridays. Recheck in 4 weeks.

## 2023-08-02 NOTE — Patient Instructions (Addendum)
Pre visit review using our clinic review tool, if applicable. No additional management support is needed unless otherwise documented below in the visit note.  Continue 1/2 tablet daily except take 1 tablet on Mondays, Wednesdays and Fridays. Recheck in 4 weeks.

## 2023-08-03 ENCOUNTER — Ambulatory Visit: Payer: Medicare PPO | Attending: Internal Medicine | Admitting: Internal Medicine

## 2023-08-03 VITALS — BP 126/68 | HR 60 | Ht 71.5 in | Wt 182.0 lb

## 2023-08-03 DIAGNOSIS — I441 Atrioventricular block, second degree: Secondary | ICD-10-CM

## 2023-08-03 DIAGNOSIS — I340 Nonrheumatic mitral (valve) insufficiency: Secondary | ICD-10-CM | POA: Diagnosis not present

## 2023-08-03 DIAGNOSIS — I5042 Chronic combined systolic (congestive) and diastolic (congestive) heart failure: Secondary | ICD-10-CM | POA: Insufficient documentation

## 2023-08-03 DIAGNOSIS — Z952 Presence of prosthetic heart valve: Secondary | ICD-10-CM | POA: Diagnosis not present

## 2023-08-03 DIAGNOSIS — I7121 Aneurysm of the ascending aorta, without rupture: Secondary | ICD-10-CM | POA: Insufficient documentation

## 2023-08-03 DIAGNOSIS — I428 Other cardiomyopathies: Secondary | ICD-10-CM

## 2023-08-03 NOTE — Progress Notes (Signed)
Cardiology Office Note:  .    Date:  08/03/2023  ID:  Noah Thompson, DOB [birth year], MRN [id number] PCP: [name], MD  [facility] Providers Cardiologist:  Christell Constant, MD     CC: Mitral and Aortic valve prosthesis   History of Present Illness: .    Noah Thompson is a 83 y.o. 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 Jude PPM.   Known LBBB.  Seen 07/12/21. 2023: had evidence of significant MR and prosthetic stenosis.  After evaluation moderate MR and likely PPM of prosthetic aortic valve. Saw EP with no changes, thought he is approaching ERI.  AT upgrade me may get CRT lead.       The patient is an 83 year old male with a mechanical aortic valve and aortic aneurysm who presents for routine follow-up.  He has a mechanical aortic valve and a pacemaker with an additional wire for left bundle branch block. He underwent surgery last year and had a follow-up in April. He has been monitored regularly with echocardiograms every six months to a year. A week-long monitoring period with a device to check for pacemaker issues did not reveal any problems. No chest pain, breathing issues, palpitations, or any 'funny heartbeats'.  He has a moderate aortic aneurysm. He is aware of symptoms that would require immediate medical attention, such as severe chest, back, or abdominal pain. He mentions lifting heavier objects despite being advised not to lift more than ten pounds.  He is on medications for heart failure, and his heart function has been stable with the current treatment regimen, which includes the pacemaker and the additional wire. He feels good overall and remains active, continuing to work on cars outside.  Relevant histories: .  Social- comes with wife; works on cars, prefers conservative threapy ROS: As per HPI.   Studies Reviewed: .   Cardiac Studies & Procedures       ECHOCARDIOGRAM  ECHOCARDIOGRAM COMPLETE 04/19/2021  Narrative ECHOCARDIOGRAM REPORT    Patient Name:   Noah Thompson Date of Exam: 04/19/2021 Medical Rec #:  [id number]       Height:       71.5 in Accession #:    [id number]      Weight:       180.8 lb Date of Birth:  Jan 03, 1941       BSA:          2.031 m Patient Age:    80 years        BP:           142/80 mmHg Patient Gender: M               HR:           53 bpm. 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 Pacemaker, Non ischemic cardiomyopathy, Signs/Symptoms:AVR (mechanical); Risk Factors:Hypertension and Dyslipidemia.  Sonographer:    Samule Ohm RDCS Referring Phys: [id number] RENEE LYNN URSUY   Sonographer Comments: Technically difficult study due to poor echo windows. Image acquisition challenging due to respiratory motion. IMPRESSIONS   1. There is a mechanical aortic valve prosthesis present (unclear surgical details). 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 Vmax 3.9 m/s,  MG, 33 mmHG, EOA 0.63 cm2, DI 0.16. The AT is prolonged ~112msec. There is trivial AI which is likely washing jets (normal finding). All other findings are suggestive of severe prosthetic valve stenosis. Recent INRs are within limits. Would recommend a cardiac CT or TEE for clarification of valve dysfunction. The aortic valve has been repaired/replaced. Aortic valve regurgitation is trivial. Severe aortic valve stenosis. 2. Left ventricular ejection fraction, by estimation, is 30 to 35%. The left ventricle has moderately decreased function. The left ventricle demonstrates global hypokinesis. Left ventricular diastolic parameters are consistent with Grade II diastolic dysfunction (pseudonormalization). Elevated left ventricular end-diastolic pressure. The E/e'  is 25.3. 3. Right ventricular systolic function is mildly reduced. The right ventricular size is moderately enlarged. There is normal pulmonary artery systolic pressure. 4. The mitral valve is grossly normal. Moderate to severe mitral valve regurgitation. No evidence of mitral stenosis. 5. Aneurysm of the ascending aorta, measuring 47 mm. 6. The inferior vena cava is normal in size with greater than 50% respiratory variability, suggesting right atrial pressure of 3 mmHg.  Comparison(s): Changes from prior study are noted. EF 30-35%. Concerns for severe prosthetic valve stenosis.  FINDINGS Left Ventricle: Left ventricular ejection fraction, by estimation, is 30 to 35%. The left ventricle has moderately decreased function. The left ventricle demonstrates global hypokinesis. The left ventricular internal cavity size was normal in size. There is no left ventricular hypertrophy. Left ventricular diastolic parameters are consistent with Grade II diastolic dysfunction (pseudonormalization). Elevated left ventricular end-diastolic pressure. The E/e' is 25.3.  Right Ventricle: The right ventricular size is moderately enlarged. No increase in right ventricular wall thickness. Right ventricular systolic function is mildly reduced. There is normal pulmonary artery systolic pressure. The tricuspid regurgitant velocity is 2.47 m/s, and with an assumed right atrial pressure of 3 mmHg, the estimated right ventricular systolic pressure is 27.4 mmHg.  Left Atrium: Left atrial size was normal in size.  Right Atrium: Right atrial size was normal in size.  Pericardium: There is no evidence of pericardial effusion.  Mitral Valve: The mitral valve is grossly normal. Moderate to severe mitral valve regurgitation. No evidence of mitral valve stenosis.  Tricuspid Valve: The tricuspid valve is grossly normal. Tricuspid valve regurgitation is trivial. No evidence of tricuspid stenosis.  Aortic Valve: There is a  mechanical aortic valve prosthesis present (unclear surgical details). 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 Vmax 3.9 m/s, MG, 33 mmHG, EOA 0.63 cm2, DI 0.16. The AT is prolonged ~187msec. There is trivial AI which is likely washing jets (normal finding). All other findings are suggestive of severe prosthetic valve stenosis. Recent INRs are within limits. Would recommend a cardiac CT or TEE for clarification of valve dysfunction. The aortic valve has been repaired/replaced. Aortic valve regurgitation is trivial. Severe aortic stenosis is present. Aortic valve mean gradient measures 33.0 mmHg. Aortic valve peak gradient measures 60.8 mmHg. Aortic valve area, by VTI measures 0.63 cm. There is a mechanical valve present in the aortic position.  Pulmonic Valve: The pulmonic valve was grossly normal. Pulmonic valve regurgitation is trivial. No evidence of pulmonic stenosis.  Aorta: The aortic root is normal in size and structure. There is an aneurysm involving the ascending aorta measuring 47 mm.  Venous: The inferior vena cava is normal in size with greater than 50% respiratory variability, suggesting right atrial pressure of 3 mmHg.  IAS/Shunts: The atrial septum is grossly normal.  Additional Comments: A device lead is visualized in the right atrium and right ventricle. There  is a small pleural effusion in the left lateral region.   LEFT VENTRICLE PLAX 2D LVIDd:         5.50 cm   Diastology LVIDs:         4.50 cm   LV e' medial:    4.82 cm/s LV PW:         0.90 cm   LV E/e' medial:  24.9 LV IVS:        1.10 cm   LV e' lateral:   6.27 cm/s LVOT diam:     2.20 cm   LV E/e' lateral: 19.1 LV SV:         57 LV SV Index:   28 LVOT Area:     3.80 cm   RIGHT VENTRICLE             IVC RV S prime:     12.20 cm/s  IVC diam: 1.40 cm TAPSE (M-mode): 1.7 cm RVSP:           27.4 mmHg  LEFT ATRIUM             Index        RIGHT ATRIUM           Index LA diam:        3.30 cm 1.63 cm/m   RA  Pressure: 3.00 mmHg LA Vol (A2C):   67.1 ml 33.04 ml/m  RA Area:     16.50 cm LA Vol (A4C):   50.3 ml 24.77 ml/m  RA Volume:   44.70 ml  22.01 ml/m LA Biplane Vol: 61.5 ml 30.29 ml/m AORTIC VALVE AV Area (Vmax):    0.61 cm AV Area (Vmean):   0.68 cm AV Area (VTI):     0.63 cm AV Vmax:           390.00 cm/s AV Vmean:          252.600 cm/s AV VTI:            0.911 m AV Peak Grad:      60.8 mmHg AV Mean Grad:      33.0 mmHg LVOT Vmax:         62.50 cm/s LVOT Vmean:        45.400 cm/s LVOT VTI:          0.150 m LVOT/AV VTI ratio: 0.16  AORTA Ao Root diam: 3.60 cm Ao Asc diam:  4.70 cm  MITRAL VALVE                TRICUSPID VALVE MV Area (PHT): 2.26 cm     TR Peak grad:   24.4 mmHg MV Decel Time: 336 msec     TR Vmax:        247.00 cm/s MV E velocity: 120.00 cm/s  Estimated RAP:  3.00 mmHg MV A velocity: 105.22 cm/s  RVSP:           27.4 mmHg MV E/A ratio:  1.14 SHUNTS Systemic VTI:  0.15 m Systemic Diam: 2.20 cm  Lennie Odor MD Electronically signed by Lennie Odor MD Signature Date/Time: 04/19/2021/8:50:13 PM    Final  TEE  ECHO TEE 08/08/2021  Narrative TRANSESOPHOGEAL ECHO REPORT    Patient Name:   Noah Thompson Date of Exam: 08/08/2021 Medical Rec #:  [id number]       Height:       71.0 in Accession #:    [id number]      Weight:  170.0 lb Date of Birth:  1941-03-26       BSA:          1.968 m Patient Age:    80 years        BP:           151/90 mmHg Patient Gender: M               HR:           58 bpm. Exam Location:  Inpatient  Procedure: Transesophageal Echo, 3D Echo, Color Doppler and Cardiac Doppler  Indications:    Mitral Regurgitation i34.0  History:        Patient has prior history of Echocardiogram examinations, most recent 04/19/2021. Pacemaker; Risk Factors:Hypertension and Dyslipidemia.  Sonographer:    Irving Burton Senior RDCS Referring Phys: [id number] [facility] A Braeson Rupe  PROCEDURE: After discussion of the risks and benefits of a  TEE, an informed consent was obtained from the patient. The transesophogeal probe was passed without difficulty through the esophogus of the patient. Sedation performed by different physician. The patient was monitored while under deep sedation. Anesthestetic sedation was provided intravenously by Anesthesiology: 174mg  of Propofol. The patient developed no complications during the procedure.  IMPRESSIONS   1. The aortic valve has been replaced by a St. Jude Mechanical Aortic Valve. Aortic valve regurgitation is trivial. Effective orifice area, by VTI measures 0.73 cm. Aortic valve mean gradient measures 26.0 mmHg. Aortic valve Vmax measures 3.51 m/s. DVI 0.19. Acceleration time 112 ms. Severe prosthetic dysfunction noted. 2. Left ventricular ejection fraction, by estimation, is 30 to 35%. The left ventricle has moderately decreased function. The left ventricle has no regional wall motion abnormalities. 3. Right ventricular systolic function is low normal. The right ventricular size is mildly enlarged. 4. No left atrial/left atrial appendage thrombus was detected. 5. The mitral valve is grossly normal. Moderate mitral valve regurgitation. No evidence of mitral stenosis. Pulmonary vein blunting without reversal. VCA of largest MR jet 0.21 cm2. 6. Aortic dilatation noted. There is moderate dilatation of the ascending aorta, measuring 49 mm.  Conclusion(s)/Recommendation(s): Will send to heart team for multi-disciplinary discussion.  FINDINGS Left Ventricle: Left ventricular ejection fraction, by estimation, is 30 to 35%. The left ventricle has moderately decreased function. The left ventricle has no regional wall motion abnormalities. The left ventricular internal cavity size was normal in size.  Right Ventricle: The right ventricular size is mildly enlarged. Right vetricular wall thickness was not assessed. Right ventricular systolic function is low normal.  Left Atrium: Left atrial size was  normal in size. No left atrial/left atrial appendage thrombus was detected.  Right Atrium: Right atrial size was normal in size.  Pericardium: There is no evidence of pericardial effusion.  Mitral Valve: The mitral valve is grossly normal. Moderate mitral valve regurgitation. No evidence of mitral valve stenosis.  Tricuspid Valve: The tricuspid valve is normal in structure. Tricuspid valve regurgitation is mild . No evidence of tricuspid stenosis.  Aortic Valve: The aortic valve has been repaired/replaced. Aortic valve regurgitation is trivial. Aortic valve mean gradient measures 26.0 mmHg. Aortic valve peak gradient measures 49.3 mmHg. Aortic valve area, by VTI measures 0.73 cm.  Pulmonic Valve: The pulmonic valve was normal in structure. Pulmonic valve regurgitation is not visualized.  Aorta: Aortic dilatation noted and the aortic root is normal in size and structure. There is moderate dilatation of the ascending aorta, measuring 49 mm.  IAS/Shunts: No atrial level shunt detected by color flow Doppler.  Additional Comments:  A device lead is visualized.   LEFT VENTRICLE PLAX 2D LVOT diam:     2.10 cm LV SV:         59 LV SV Index:   30 LVOT Area:     3.46 cm   AORTIC VALVE AV Area (Vmax):    0.66 cm AV Area (Vmean):   0.72 cm AV Area (VTI):     0.73 cm AV Vmax:           351.00 cm/s AV Vmean:          239.000 cm/s AV VTI:            0.805 m AV Peak Grad:      49.3 mmHg AV Mean Grad:      26.0 mmHg LVOT Vmax:         66.90 cm/s LVOT Vmean:        50.000 cm/s LVOT VTI:          0.170 m LVOT/AV VTI ratio: 0.21  MR Peak grad:    101.6 mmHg MR Mean grad:    46.0 mmHg    SHUNTS MR Vmax:         504.00 cm/s  Systemic VTI:  0.17 m MR Vmean:        307.0 cm/s   Systemic Diam: 2.10 cm MR PISA:         3.08 cm MR PISA Eff ROA: 24 mm MR PISA Radius:  0.70 cm  Riley Lam MD Electronically signed by Riley Lam MD Signature Date/Time: 08/08/2021/4:53:53  PM    Final   CT SCANS  CT CORONARY MORPH W/CTA COR W/SCORE 08/08/2021  Addendum 08/09/2021  4:01 PM ADDENDUM REPORT: 08/09/2021 15:58  CLINICAL DATA:  Aortic Valve pathology with assessment of prosthetic valve  EXAM: Cardiac TAVR CT  TECHNIQUE: The patient was scanned on a Siemens Force 192 slice scanner. A 120 kV retrospective scan was triggered in the descending thoracic aorta at 111 HU's. Gantry rotation speed was 270 msecs and collimation was .9 mm. No beta blockade or nitro were given. The 3D data set was reconstructed in 5% intervals of the R-R cycle. Systolic and diastolic phases were analyzed on a dedicated work station using MPR, MIP and VRT modes. The patient received 95 cc of contrast.  FINDINGS: Prosthetic Valve: There is a Clinical research associate Aortic Valve  Opening angle: 13 degrees (normal < 20 degrees) without evidence leaflet restriction.  Closing angle: 120 degrees (normal 120 degrees)  Level of coronary ostia above tilting disks: 9 mm (left), 14 mm (right)  No significant pannus within the valve.  Aortic Annulus Measurements- 20% Phase  Major annulus diameter: 18 mm  Minor annulus diameter:18 mm  Annular perimeter: 56 mm  Annular area: Area 2.41 cm2  Suspect 19 mm St. Jude Bileaflet Valve as the native valve  Aortic Root Measurements  Sinotubular Junction: 38 m  Ascending Thoracic Aorta: 49 mm  Aortic Arch: 39 mm  Descending Thoracic Aorta: 23 mm  Sinus of Valsalva Measurements:  Right coronary cusp width: 32 mm  Left coronary cusp width: 31 mm  Non coronary cusp width: 34 mm  Mean diameter: 34 mm  Non SAVR Valve Findings:  There is a dual chamber pacemaker with leads noted into the SVC.  The atrial lead terminates in the right atrial appendage.  The ventricular lead terminates in the right ventricular apex.  No interatrial shunting.  Normal pulmonary artery size.  Normal pulmonary vein drainage.  Patent  left atrial appendage.  Coronary Calcium Score:  Left main: 1  Left anterior descending artery: 65  Left circumflex artery: 140  Right coronary artery: 0  Total: 206  Percentile: 37 th for age, sex, and race matched control.  IMPRESSION: 1. Mechanical Aortic valve. There is normal opening and closing angle of the prosthesis, with no significant pannus or evidence of pannus.  2. Patient's total coronary artery calcium score is 206, which is 37th percentile for subjects of the same age, gender, and race based populations.  3. 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 Will need 6 month follow up study of aorta.  4. Study consistent with patient prosthesis mismatch.  RECOMMENDATIONS:  Coronary artery calcium (CAC) score is a strong predictor of incident coronary heart disease (CHD) and provides predictive information beyond traditional risk factors. CAC scoring is reasonable to use in the decision to withhold, postpone, or initiate statin therapy in intermediate-risk or selected borderline-risk asymptomatic adults (age 28-75 years and LDL-C >=70 to <190 mg/dL) who do not have diabetes or established atherosclerotic cardiovascular disease (ASCVD).* In intermediate-risk (10-year ASCVD risk >=7.5% to <20%) adults or selected borderline-risk (10-year ASCVD risk >=5% to <7.5%) adults in whom a CAC score is measured for the purpose of making a treatment decision the following recommendations have been made:  If CAC = 0, it is reasonable to withhold statin therapy and reassess in 5 to 10 years, as long as higher risk conditions are absent (diabetes mellitus, family history of premature CHD in first degree relatives (males <55 years; females <65 years), cigarette smoking, LDL >=190 mg/dL or other independent risk factors).  If CAC is 1 to 99, it is reasonable to initiate statin therapy for patients >=52 years of age.  If CAC is >=100 or >=75th percentile, it is  reasonable to initiate statin therapy at any age.  Cardiology referral should be considered for patients with CAC scores >=400 or >=75th percentile.  *2018 AHA/ACC/AACVPR/AAPA/ABC/ACPM/ADA/AGS/APhA/ASPC/NLA/PCNA Guideline on the Management of Blood Cholesterol: A Report of the American College of Cardiology/American Heart Association Task Force on Clinical Practice Guidelines. J Am Coll Cardiol. 2019;73(24):3168-3209.  Neiva Maenza   Electronically Signed By: Riley Lam M.D. On: 08/09/2021 15:58  Narrative EXAM: OVER-READ INTERPRETATION  CT CHEST  The following report is an over-read performed by radiologist Dr. Trudie Reed of [facility] on 08/08/2021. This over-read does not include interpretation of cardiac or coronary anatomy or pathology. The coronary calcium score/coronary CTA interpretation by the cardiologist is attached.  COMPARISON:  None.  FINDINGS: Extracardiac findings will be described separately under dictation for contemporaneously obtained CTA chest, abdomen and pelvis.  IMPRESSION: Please see separate dictation for contemporaneously obtained CTA chest, abdomen and pelvis dated 08/08/2021 for full description of relevant extracardiac findings.  Electronically Signed: By: Trudie Reed M.D. On: 08/08/2021 11:20           Physical Exam:    VS:  BP 126/68 (BP Location: Right Arm)   Pulse 60   Ht 5' 11.5" (1.816 m)   Wt 182 lb (82.6 kg)   SpO2 98%   BMI 25.03 kg/m    Wt Readings from Last 3 Encounters:  08/03/23 182 lb (82.6 kg)  10/11/22 189 lb (85.7 kg)  07/06/22 180 lb (81.6 kg)    Gen: no distress, Cardiac: No Rubs or Gallops,Mechanical Heart sounds, +2 radial pulses Respiratory: Clear to auscultation bilaterally, normal effort, normal  respiratory rate GI: Soft, nontender, non-distended  MS: No  edema;  moves all extremities Integument: Skin feels warm Neuro:  At time of evaluation, alert and  oriented to person/place/time/situation  Psych: Normal affect, patient feels well   ASSESSMENT AND PLAN: .    Aortic Aneurysm Moderate aortic aneurysm with high risk for complications due to age (82 years) and previous heart surgeries. Emphasized need for immediate emergency care if experiencing severe chest, back, or abdominal pain. Patient understands high surgical risk and need for immediate action if severe symptoms occur. - Order echocardiogram to monitor aneurysm and assess aortic valve.  Mechanical Aortic Valve Mechanical aortic valve with leaky valve. Monitoring valve function to ensure it is not contributing to symptoms such as dyspnea or heart failure. Discussed potential need for valve repair if symptoms worsen. - Order echocardiogram to assess mechanical aortic valve and check for mitral regurgitation. - continue coumadin  Heart Failure (Chronic Combined) On heart failure medications with left bundle branch block. Addition of extra wire has improved heart function. No current symptoms of heart failure. Emphasized importance of continued medication adherence and monitoring. - Continue current heart failure medications. - Monitor heart function with echocardiogram. - s/p CRT  Pacemaker Management St. Jude pacemaker with AV nodal pacing. Device is regularly monitored with no issues detected.   One year with me unless severe TAA or severe MR   Riley Lam, MD FASE [facility] Cardiologist [facility]  [address] [place] [place], [place] [address] [phone]  4:30 PM

## 2023-08-03 NOTE — Patient Instructions (Signed)
 Medication Instructions:  Your physician recommends that you continue on your current medications as directed. Please refer to the Current Medication list given to you today.  *If you need a refill on your cardiac medications before your next appointment, please call your pharmacy*   Lab Work: NONE If you have labs (blood work) drawn today and your tests are completely normal, you will receive your results only by: MyChart Message (if you have MyChart) OR A paper copy in the mail If you have any lab test that is abnormal or we need to change your treatment, we will call you to review the results.   Testing/Procedures: Your physician has requested that you have an echocardiogram. Echocardiography is a painless test that uses sound waves to create images of your heart. It provides your doctor with information about the size and shape of your heart and how well your heart's chambers and valves are working. This procedure takes approximately one hour. There are no restrictions for this procedure. Please do NOT wear cologne, perfume, aftershave, or lotions (deodorant is allowed). Please arrive 15 minutes prior to your appointment time.  Please note: We ask at that you not bring children with you during ultrasound (echo/ vascular) testing. Due to room size and safety concerns, children are not allowed in the ultrasound rooms during exams. Our front office staff cannot provide observation of children in our lobby area while testing is being conducted. An adult accompanying a patient to their appointment will only be allowed in the ultrasound room at the discretion of the ultrasound technician under special circumstances. We apologize for any inconvenience.    Follow-Up: At [facility], you and your health needs are our priority.  As part of our continuing mission to provide you with exceptional heart care, we have created designated Provider Care Teams.  These Care Teams include your  primary Cardiologist (physician) and Advanced Practice Providers (APPs -  Physician Assistants and Nurse Practitioners) who all work together to provide you with the care you need, when you need it.  We recommend signing up for the patient portal called "MyChart".  Sign up information is provided on this After Visit Summary.  MyChart is used to connect with patients for Virtual Visits (Telemedicine).  Patients are able to view lab/test results, encounter notes, upcoming appointments, etc.  Non-urgent messages can be sent to your provider as well.   To learn more about what you can do with MyChart, go to [email or web address].    Your next appointment:   1 year(s)  Provider:   Christell Constant, MD

## 2023-08-10 NOTE — Progress Notes (Signed)
 Remote pacemaker transmission.

## 2023-08-28 ENCOUNTER — Ambulatory Visit (HOSPITAL_COMMUNITY): Payer: Medicare PPO

## 2023-08-30 ENCOUNTER — Ambulatory Visit (INDEPENDENT_AMBULATORY_CARE_PROVIDER_SITE_OTHER): Payer: Medicare PPO

## 2023-08-30 DIAGNOSIS — Z7901 Long term (current) use of anticoagulants: Secondary | ICD-10-CM | POA: Diagnosis not present

## 2023-08-30 LAB — POCT INR: INR: 2.6 (ref 2.0–3.0)

## 2023-08-30 NOTE — Patient Instructions (Addendum)
 Pre visit review using our clinic review tool, if applicable. No additional management support is needed unless otherwise documented below in the visit note.  Continue 1/2 tablet daily except take 1 tablet on Mondays, Wednesdays and Fridays. Recheck in 4 weeks.

## 2023-08-30 NOTE — Progress Notes (Signed)
Continue 1/2 tablet daily except take 1 tablet on Mondays, Wednesdays and Fridays. Recheck in 4 weeks. 

## 2023-09-27 ENCOUNTER — Ambulatory Visit (INDEPENDENT_AMBULATORY_CARE_PROVIDER_SITE_OTHER): Payer: Medicare PPO

## 2023-09-27 DIAGNOSIS — Z7901 Long term (current) use of anticoagulants: Secondary | ICD-10-CM

## 2023-09-27 LAB — POCT INR: INR: 2.8 (ref 2.0–3.0)

## 2023-09-27 NOTE — Progress Notes (Signed)
Continue 1/2 tablet daily except take 1 tablet on Mondays, Wednesdays and Fridays. Recheck in 4 weeks. 

## 2023-09-27 NOTE — Patient Instructions (Addendum)
 Pre visit review using our clinic review tool, if applicable. No additional management support is needed unless otherwise documented below in the visit note.  Continue 1/2 tablet daily except take 1 tablet on Mondays, Wednesdays and Fridays. Recheck in 4 weeks.

## 2023-10-05 ENCOUNTER — Ambulatory Visit (INDEPENDENT_AMBULATORY_CARE_PROVIDER_SITE_OTHER): Payer: Medicare Other

## 2023-10-05 DIAGNOSIS — I441 Atrioventricular block, second degree: Secondary | ICD-10-CM

## 2023-10-10 LAB — CUP PACEART REMOTE DEVICE CHECK
Battery Remaining Longevity: 66 mo
Battery Remaining Percentage: 84 %
Battery Voltage: 2.98 V
Brady Statistic AP VP Percent: 86 %
Brady Statistic AP VS Percent: 1 %
Brady Statistic AS VP Percent: 12 %
Brady Statistic AS VS Percent: 1 %
Brady Statistic RA Percent Paced: 85 %
Date Time Interrogation Session: 20250404033747
Implantable Lead Connection Status: 753985
Implantable Lead Connection Status: 753985
Implantable Lead Connection Status: 753985
Implantable Lead Implant Date: 19930223
Implantable Lead Implant Date: 19930223
Implantable Lead Implant Date: 20240104
Implantable Lead Location: 753858
Implantable Lead Location: 753859
Implantable Lead Location: 753860
Implantable Pulse Generator Implant Date: 20240104
Lead Channel Impedance Value: 480 Ohm
Lead Channel Impedance Value: 490 Ohm
Lead Channel Impedance Value: 560 Ohm
Lead Channel Pacing Threshold Amplitude: 0.75 V
Lead Channel Pacing Threshold Amplitude: 1.25 V
Lead Channel Pacing Threshold Amplitude: 1.25 V
Lead Channel Pacing Threshold Pulse Width: 0.5 ms
Lead Channel Pacing Threshold Pulse Width: 0.5 ms
Lead Channel Pacing Threshold Pulse Width: 0.7 ms
Lead Channel Sensing Intrinsic Amplitude: 12 mV
Lead Channel Sensing Intrinsic Amplitude: 3.5 mV
Lead Channel Setting Pacing Amplitude: 2 V
Lead Channel Setting Pacing Amplitude: 2.5 V
Lead Channel Setting Pacing Amplitude: 2.5 V
Lead Channel Setting Pacing Pulse Width: 0.5 ms
Lead Channel Setting Pacing Pulse Width: 0.5 ms
Lead Channel Setting Sensing Sensitivity: 2 mV
Pulse Gen Model: 3562
Pulse Gen Serial Number: 8134139

## 2023-10-17 DIAGNOSIS — L821 Other seborrheic keratosis: Secondary | ICD-10-CM | POA: Diagnosis not present

## 2023-10-17 DIAGNOSIS — Z85828 Personal history of other malignant neoplasm of skin: Secondary | ICD-10-CM | POA: Diagnosis not present

## 2023-10-17 DIAGNOSIS — Z08 Encounter for follow-up examination after completed treatment for malignant neoplasm: Secondary | ICD-10-CM | POA: Diagnosis not present

## 2023-10-17 DIAGNOSIS — C44319 Basal cell carcinoma of skin of other parts of face: Secondary | ICD-10-CM | POA: Diagnosis not present

## 2023-10-17 DIAGNOSIS — L570 Actinic keratosis: Secondary | ICD-10-CM | POA: Diagnosis not present

## 2023-10-17 DIAGNOSIS — D485 Neoplasm of uncertain behavior of skin: Secondary | ICD-10-CM | POA: Diagnosis not present

## 2023-10-22 ENCOUNTER — Ambulatory Visit (HOSPITAL_COMMUNITY): Payer: Medicare PPO

## 2023-10-25 ENCOUNTER — Ambulatory Visit

## 2023-10-25 DIAGNOSIS — Z7901 Long term (current) use of anticoagulants: Secondary | ICD-10-CM

## 2023-10-25 LAB — POCT INR: INR: 3 (ref 2.0–3.0)

## 2023-10-25 NOTE — Progress Notes (Signed)
 Continue 1/2 tablet daily except take 1 tablet on Mondays, Wednesdays and Fridays.  Recheck in 6 weeks.

## 2023-10-25 NOTE — Patient Instructions (Addendum)
 Pre visit review using our clinic review tool, if applicable. No additional management support is needed unless otherwise documented below in the visit note. ? ?Continue 1/2 tablet daily except take 1 tablet on Mondays, Wednesdays and Fridays. Recheck in 6 weeks ?

## 2023-11-12 NOTE — Addendum Note (Signed)
 Addended by: Lott Rouleau A on: 11/12/2023 09:30 AM   Modules accepted: Orders

## 2023-11-12 NOTE — Progress Notes (Signed)
 Remote pacemaker transmission.

## 2023-11-16 ENCOUNTER — Other Ambulatory Visit: Payer: Self-pay | Admitting: Internal Medicine

## 2023-11-19 ENCOUNTER — Telehealth (HOSPITAL_COMMUNITY): Payer: Self-pay | Admitting: Internal Medicine

## 2023-11-19 NOTE — Telephone Encounter (Signed)
 Will send this message to the pts ordering MD and covering RN, as a general FYI.

## 2023-11-19 NOTE — Telephone Encounter (Signed)
 pt wife called and cancelled  Echocardiogram scheduled for  11/22/23. PT wife  states he will call back to reschedule. Order will be removed from the echo wq and when pat calls back we will reinstate the order. Thank you.

## 2023-11-22 ENCOUNTER — Ambulatory Visit (HOSPITAL_COMMUNITY)

## 2023-11-30 DIAGNOSIS — H2513 Age-related nuclear cataract, bilateral: Secondary | ICD-10-CM | POA: Diagnosis not present

## 2023-12-06 ENCOUNTER — Ambulatory Visit

## 2023-12-06 DIAGNOSIS — Z7901 Long term (current) use of anticoagulants: Secondary | ICD-10-CM | POA: Diagnosis not present

## 2023-12-06 LAB — POCT INR: INR: 4.8 — AB (ref 2.0–3.0)

## 2023-12-06 NOTE — Patient Instructions (Addendum)
 Pre visit review using our clinic review tool, if applicable. No additional management support is needed unless otherwise documented below in the visit note.  Hold dose today and then reduce dose tomorrow to take 1/2 tablet and then change weekly dose to take 1/2 tablet daily except take 1 tablet on Mondays and Fridays. Recheck in 2 weeks.

## 2023-12-06 NOTE — Progress Notes (Signed)
 Pt has stopped eating all vitamin K containing foods. Has been eating pineapple which will increase INR. Pt has also been eating a lot of dark chocolate. The cocoa in this will increase INR. Hold dose today and then reduce dose tomorrow to take 1/2 tablet and then change weekly dose to take 1/2 tablet daily except take 1 tablet on Mondays and Fridays. Recheck in 2 weeks.

## 2023-12-11 DIAGNOSIS — H26493 Other secondary cataract, bilateral: Secondary | ICD-10-CM | POA: Diagnosis not present

## 2023-12-17 NOTE — Progress Notes (Signed)
  Electrophysiology Office Note:   Date:  12/18/2023  ID:  Noah Thompson, DOB February 02, 1941, MRN [id number]  Primary Cardiologist: Jann Melody, MD Primary Heart Failure: None Electrophysiologist: Manya Sells, MD      History of Present Illness:   Noah Thompson is a 83 y.o. 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   Since last being seen in our clinic the patient reports doing well overall.  His wife accompanies him today for the visit.  No device related concerns.  He feels he has been doing well in terms of monitoring his sodium and his fluid status has been good.  He denies chest pain, palpitations, dyspnea, PND, orthopnea, nausea, vomiting, dizziness, syncope, edema, weight gain, or early satiety.   Review of systems complete and found to be negative unless listed in HPI.   EP Information / Studies Reviewed:    EKG is ordered today. Personal review as below.  EKG Interpretation Date/Time:  Tuesday December 18 2023 13:50:55 EDT Ventricular Rate:  66 PR Interval:  186 QRS Duration:  182 QT Interval:  538 QTC Calculation: 564 R Axis:   233  Text Interpretation: AV dual-paced rhythm with occasional ventricular-paced complexes Confirmed by Creighton Doffing ([id number]) on 12/18/2023 2:05:32 PM   PPM Interrogation-  reviewed in detail today,  See PACEART report.  Device History: Abbott Dual Chamber PPM implanted 08/26/1991 for CHB, generator change 01/25/12, upgrade to CRT-P 07/06/22   Studies:  TEE 08/08/21 > LVEF 30-35%, [employer] mechanical AV, severe prosthetic dysfunction  CT Coronary Morphology w/CTA 08/08/21 > mechanical AV, CAC score 206 / 37th percentile for matched controls, mod-severe thoracic aortic aneurysm, study consistent with patient prosthesis mismatch            Physical Exam:   VS:  BP 124/72   Pulse 66   Ht 5' 11.5 (1.816 m)   Wt 184 lb 3.2 oz  (83.6 kg)   SpO2 97%   BMI 25.33 kg/m    Wt Readings from Last 3 Encounters:  12/18/23 184 lb 3.2 oz (83.6 kg)  08/03/23 182 lb (82.6 kg)  10/11/22 189 lb (85.7 kg)     GEN: Well nourished, well developed in no acute distress NECK: No JVD; No carotid bruits CARDIAC: Regular rate and rhythm, no murmurs, rubs, gallops RESPIRATORY:  Clear to auscultation without rales, wheezing or rhonchi  ABDOMEN: Soft, non-tender, non-distended EXTREMITIES:  No edema; No deformity   ASSESSMENT AND PLAN:    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 Carolynne Citron or EP APP in 6 months  Signed, Creighton Doffing, NP-C, AGACNP-BC [facility] - Electrophysiology  12/18/2023, 5:20 PM

## 2023-12-18 ENCOUNTER — Ambulatory Visit: Attending: Pulmonary Disease | Admitting: Pulmonary Disease

## 2023-12-18 ENCOUNTER — Encounter: Payer: Self-pay | Admitting: Pulmonary Disease

## 2023-12-18 VITALS — BP 124/72 | HR 66 | Ht 71.5 in | Wt 184.2 lb

## 2023-12-18 DIAGNOSIS — Z950 Presence of cardiac pacemaker: Secondary | ICD-10-CM | POA: Diagnosis not present

## 2023-12-18 DIAGNOSIS — D6869 Other thrombophilia: Secondary | ICD-10-CM

## 2023-12-18 DIAGNOSIS — I428 Other cardiomyopathies: Secondary | ICD-10-CM

## 2023-12-18 DIAGNOSIS — I5042 Chronic combined systolic (congestive) and diastolic (congestive) heart failure: Secondary | ICD-10-CM | POA: Diagnosis not present

## 2023-12-18 DIAGNOSIS — Z952 Presence of prosthetic heart valve: Secondary | ICD-10-CM

## 2023-12-18 DIAGNOSIS — R001 Bradycardia, unspecified: Secondary | ICD-10-CM

## 2023-12-18 DIAGNOSIS — I441 Atrioventricular block, second degree: Secondary | ICD-10-CM | POA: Diagnosis not present

## 2023-12-18 LAB — CUP PACEART INCLINIC DEVICE CHECK
Date Time Interrogation Session: 20250617171750
Implantable Lead Connection Status: 753985
Implantable Lead Connection Status: 753985
Implantable Lead Connection Status: 753985
Implantable Lead Implant Date: 19930223
Implantable Lead Implant Date: 19930223
Implantable Lead Implant Date: 20240104
Implantable Lead Location: 753858
Implantable Lead Location: 753859
Implantable Lead Location: 753860
Implantable Pulse Generator Implant Date: 20240104
Pulse Gen Model: 3562
Pulse Gen Serial Number: 8134139

## 2023-12-18 MED ORDER — METOPROLOL SUCCINATE ER 50 MG PO TB24: ORAL_TABLET | ORAL | 1 refills | Status: DC

## 2023-12-18 NOTE — Patient Instructions (Signed)
 Medication Instructions:  Your physician recommends that you continue on your current medications as directed. Please refer to the Current Medication list given to you today.  *If you need a refill on your cardiac medications before your next appointment, please call your pharmacy*  Lab Work: None ordered If you have labs (blood work) drawn today and your tests are completely normal, you will receive your results only by: MyChart Message (if you have MyChart) OR A paper copy in the mail If you have any lab test that is abnormal or we need to change your treatment, we will call you to review the results.  Follow-Up: At [facility], you and your health needs are our priority.  As part of our continuing mission to provide you with exceptional heart care, our providers are all part of one team.  This team includes your primary Cardiologist (physician) and Advanced Practice Providers or APPs (Physician Assistants and Nurse Practitioners) who all work together to provide you with the care you need, when you need it.  Your next appointment:   6 month(s)  Provider:   Creighton Doffing, NP    We recommend signing up for the patient portal called "MyChart".  Sign up information is provided on this After Visit Summary.  MyChart is used to connect with patients for Virtual Visits (Telemedicine).  Patients are able to view lab/test results, encounter notes, upcoming appointments, etc.  Non-urgent messages can be sent to your provider as well.   To learn more about what you can do with MyChart, go to [email or web address].

## 2023-12-20 ENCOUNTER — Ambulatory Visit (INDEPENDENT_AMBULATORY_CARE_PROVIDER_SITE_OTHER)

## 2023-12-20 DIAGNOSIS — Z7901 Long term (current) use of anticoagulants: Secondary | ICD-10-CM | POA: Diagnosis not present

## 2023-12-20 LAB — POCT INR: INR: 3.6 — AB (ref 2.0–3.0)

## 2023-12-20 NOTE — Patient Instructions (Addendum)
 Pre visit review using our clinic review tool, if applicable. No additional management support is needed unless otherwise documented below in the visit note.  Change weekly dose to take 1/2 tablet daily except take 1 tablet on Mondays. Recheck in 3 weeks.

## 2023-12-20 NOTE — Progress Notes (Signed)
 Pt has been incorporating more salads into his diet, as directed at his last visit. He has stopped eating pineapple and has reduced his dark chocolate consumption.  Change weekly dose to take 1/2 tablet daily except take 1 tablet on Mondays. Recheck in 3 weeks.

## 2024-01-03 ENCOUNTER — Ambulatory Visit (INDEPENDENT_AMBULATORY_CARE_PROVIDER_SITE_OTHER): Payer: Medicare Other

## 2024-01-03 DIAGNOSIS — I441 Atrioventricular block, second degree: Secondary | ICD-10-CM | POA: Diagnosis not present

## 2024-01-03 LAB — CUP PACEART REMOTE DEVICE CHECK
Battery Remaining Longevity: 51 mo
Battery Remaining Percentage: 80 %
Battery Voltage: 2.98 V
Brady Statistic AP VP Percent: 77 %
Brady Statistic AP VS Percent: 1 %
Brady Statistic AS VP Percent: 18 %
Brady Statistic AS VS Percent: 1 %
Brady Statistic RA Percent Paced: 74 %
Date Time Interrogation Session: 20250703021635
Implantable Lead Connection Status: 753985
Implantable Lead Connection Status: 753985
Implantable Lead Connection Status: 753985
Implantable Lead Implant Date: 19930223
Implantable Lead Implant Date: 19930223
Implantable Lead Implant Date: 20240104
Implantable Lead Location: 753858
Implantable Lead Location: 753859
Implantable Lead Location: 753860
Implantable Pulse Generator Implant Date: 20240104
Lead Channel Impedance Value: 480 Ohm
Lead Channel Impedance Value: 480 Ohm
Lead Channel Impedance Value: 560 Ohm
Lead Channel Pacing Threshold Amplitude: 0.75 V
Lead Channel Pacing Threshold Amplitude: 1 V
Lead Channel Pacing Threshold Amplitude: 1.5 V
Lead Channel Pacing Threshold Pulse Width: 0.5 ms
Lead Channel Pacing Threshold Pulse Width: 0.5 ms
Lead Channel Pacing Threshold Pulse Width: 0.7 ms
Lead Channel Sensing Intrinsic Amplitude: 12 mV
Lead Channel Sensing Intrinsic Amplitude: 4.1 mV
Lead Channel Setting Pacing Amplitude: 2 V
Lead Channel Setting Pacing Amplitude: 2.5 V
Lead Channel Setting Pacing Amplitude: 2.75 V
Lead Channel Setting Pacing Pulse Width: 0.5 ms
Lead Channel Setting Pacing Pulse Width: 0.5 ms
Lead Channel Setting Sensing Sensitivity: 2 mV
Pulse Gen Model: 3562
Pulse Gen Serial Number: 8134139

## 2024-01-09 ENCOUNTER — Ambulatory Visit: Payer: Self-pay | Admitting: Internal Medicine

## 2024-01-10 ENCOUNTER — Ambulatory Visit

## 2024-01-10 DIAGNOSIS — Z7901 Long term (current) use of anticoagulants: Secondary | ICD-10-CM

## 2024-01-10 LAB — POCT INR: INR: 2.2 (ref 2.0–3.0)

## 2024-01-10 NOTE — Progress Notes (Signed)
 Pt reports he stopped eating all dark chocolate which will increase INR. He has also increased intake of vitamin K containing foods. Advised to keep these both consistent in his diet. Pt verbalized understanding.  Increase dose today to take 1 tablet and then continue 1/2 tablet daily except take 1 tablet on Mondays. Recheck in 3 weeks.

## 2024-01-10 NOTE — Patient Instructions (Addendum)
 Pre visit review using our clinic review tool, if applicable. No additional management support is needed unless otherwise documented below in the visit note.  Increase dose today to take 1 tablet and then continue 1/2 tablet daily except take 1 tablet on Mondays. Recheck in 3 weeks.

## 2024-01-22 ENCOUNTER — Ambulatory Visit (INDEPENDENT_AMBULATORY_CARE_PROVIDER_SITE_OTHER)

## 2024-01-22 VITALS — BP 124/72 | Ht 71.5 in | Wt 184.0 lb

## 2024-01-22 DIAGNOSIS — Z0000 Encounter for general adult medical examination without abnormal findings: Secondary | ICD-10-CM | POA: Diagnosis not present

## 2024-01-22 DIAGNOSIS — Z2821 Immunization not carried out because of patient refusal: Secondary | ICD-10-CM

## 2024-01-22 NOTE — Patient Instructions (Signed)
 Mr. Litsey , Thank you for taking time out of your busy schedule to complete your Annual Wellness Visit with me. I enjoyed our conversation and look forward to speaking with you again next year. I, as well as your care team,  appreciate your ongoing commitment to your health goals. Please review the following plan we discussed and let me know if I can assist you in the future. Your Game plan/ To Do List    Referrals: If you haven't heard from the office you've been referred to, please reach out to them at the phone provided.  none Follow up Visits: Next Medicare AWV with our clinical staff: 01/22/2025   Have you seen your provider in the last 6 months (3 months if uncontrolled diabetes)? No Next Office Visit with your provider: 02/26/2024  Clinician Recommendations:  Aim for 30 minutes of exercise or brisk walking, 6-8 glasses of water, and 5 servings of fruits and vegetables each day.       This is a list of the screening recommended for you and due dates:  Health Maintenance  Topic Date Due   COVID-19 Vaccine (1) Never done   Zoster (Shingles) Vaccine (1 of 2) Never done   DTaP/Tdap/Td vaccine (1 - Tdap) 04/10/2002   Pneumococcal Vaccine for age over 33 (3 of 3 - PCV20 or PCV21) 04/12/2016   Flu Shot  02/01/2024   Medicare Annual Wellness Visit  01/21/2025   Hepatitis B Vaccine  Aged Out   HPV Vaccine  Aged Out   Meningitis B Vaccine  Aged Out   Hepatitis C Screening  Discontinued    Advanced directives: (Declined) Advance directive discussed with you today. Even though you declined this today, please call our office should you change your mind, and we can give you the proper paperwork for you to fill out. Advance Care Planning is important because it:  [x]  Makes sure you receive the medical care that is consistent with your values, goals, and preferences  [x]  It provides guidance to your family and loved ones and reduces their decisional burden about whether or not they are  making the right decisions based on your wishes.  Follow the link provided in your after visit summary or read over the paperwork we have mailed to you to help you started getting your Advance Directives in place. If you need assistance in completing these, please reach out to us  so that we can help you!  See attachments for Preventive Care and Fall Prevention Tips.

## 2024-01-22 NOTE — Progress Notes (Signed)
 Because this visit was a virtual/telehealth visit,  certain criteria was not obtained, such a blood pressure, CBG if applicable, and timed get up and go. Any medications not marked as taking were not mentioned during the medication reconciliation part of the visit. Any vitals not documented were not able to be obtained due to this being a telehealth visit or patient was unable to self-report a recent blood pressure reading due to a lack of equipment at home via telehealth. Vitals that have been documented are verbally provided by the patient.  This visit was performed by a medical professional under my direct supervision. I was immediately available for consultation/collaboration. I have reviewed and agree with the Annual Wellness Visit documentation.  Subjective:   Noah Thompson is a 83 y.o. who presents for a Medicare Wellness preventive visit.  As a reminder, Annual Wellness Visits don't include a physical exam, and some assessments may be limited, especially if this visit is performed virtually. We may recommend an in-person follow-up visit with your provider if needed.  Visit Complete: Virtual I connected with  Noah Thompson on 01/22/24 by a audio enabled telemedicine application and verified that I am speaking with the correct person using two identifiers.  Patient Location: Home  Provider Location: Home Office  I discussed the limitations of evaluation and management by telemedicine. The patient expressed understanding and agreed to proceed.  Vital Signs: Because this visit was a virtual/telehealth visit, some criteria may be missing or patient reported. Any vitals not documented were not able to be obtained and vitals that have been documented are patient reported.  VideoDeclined- This patient declined Librarian, academic. Therefore the visit was completed with audio only.  Persons Participating in Visit: Patient.  AWV Questionnaire: No: Patient  Medicare AWV questionnaire was not completed prior to this visit.  Cardiac Risk Factors include: advanced age (>88men, >51 women);male gender;hypertension     Objective:    Today's Vitals   01/22/24 1604  BP: 124/72  Weight: 184 lb (83.5 kg)  Height: 5' 11.5 (1.816 m)   Body mass index is 25.31 kg/m.     01/22/2024    4:12 PM 07/06/2022   12:51 PM 08/08/2021   10:34 AM 11/08/2018    1:26 PM 07/27/2017    4:15 PM 09/17/2014    5:26 PM 01/25/2012    8:57 AM  Advanced Directives  Does Patient Have a Medical Advance Directive? No No No No No  No  Patient does not have advance directive   Would patient like information on creating a medical advance directive? No - Patient declined No - Patient declined No - Patient declined No - Patient declined  No - Patient declined  Yes - Educational materials given    Pre-existing out of facility DNR order (yellow form or pink MOST form)       No      Data saved with a previous flowsheet row definition    Current Medications (verified) Outpatient Encounter Medications as of 01/22/2024  Medication Sig   acetaminophen  (TYLENOL ) 500 MG tablet Take 500-1,000 mg by mouth every 6 (six) hours as needed (for pain.).   alum & mag hydroxide-simeth (MAALOX/MYLANTA) 200-200-20 MG/5ML suspension Take 15 mLs by mouth every 6 (six) hours as needed for indigestion or heartburn.   amoxicillin (AMOXIL) 500 MG capsule Take 2,000 mg by mouth See admin instructions. Take 4 capsules (2000 mg) by mouth 1 hour prior to dental appointment   cyanocobalamin  (V-R VITAMIN B-12)  500 MCG tablet Take 1 tablet (500 mcg total) by mouth in the morning and at bedtime.   diphenhydrAMINE (BENADRYL) 25 MG tablet Take 25-50 mg by mouth daily as needed for allergies.   famotidine  (PEPCID ) 20 MG tablet Take 1 tablet (20 mg total) by mouth at bedtime.   guaiFENesin  (MUCINEX ) 600 MG 12 hr tablet Take 600 mg by mouth 2 (two) times daily as needed (congestion/cough).   loperamide  (IMODIUM ) 2 MG  capsule Take 1 capsule (2 mg total) by mouth 4 (four) times daily as needed for diarrhea or loose stools.   loratadine  (CLARITIN ) 10 MG tablet Take 1 tablet (10 mg total) by mouth daily as needed for allergies.   metoprolol  succinate (TOPROL -XL) 50 MG 24 hr tablet Take one tablet by mouth daily. Take with or immediately following a meal.   Misc Natural Products (IMMUNE FORMULA PO) Take 1-2 tablets by mouth in the morning. Vitafusion Triple Immune Power Gummy Vitamins   omeprazole  (PRILOSEC) 40 MG capsule Take 1 capsule (40 mg total) by mouth daily. Take daily for 3 days then as needed   sacubitril -valsartan  (ENTRESTO ) 97-103 MG Take 1 tablet by mouth 2 (two) times daily.   triamcinolone  cream (KENALOG ) 0.1 % Apply 1 Application topically in the morning and at bedtime. For max 2 weeks at a time   warfarin (COUMADIN ) 5 MG tablet TAKE 1/2 TABLET BY MOUTH DAILY EXCEPT TAKE 1 TABLET ON MONDAYS, WEDNESDAYS AND FRIDAYS OR AS DIRECTED BY ANTICOAGULATION CLINIC   No facility-administered encounter medications on file as of 01/22/2024.    Allergies (verified) Coreg  [carvedilol ], Lisinopril , and Tessalon  [benzonatate ]   History: Past Medical History:  Diagnosis Date   CAP (community acquired pneumonia) 07/25/2017   Cataract    left eye   CHF (congestive heart failure) (HCC)    Complete heart block (HCC)    a. 1993 s/p PPM 2/2 perioperative heart block;  b. 01/2012 s/p SJM Accent DC PPM upgrade.   Depression    GERD (gastroesophageal reflux disease)    History of nephritis    a. as child.   History of pneumonia    Hyperlipidemia    Hypertension    NICM (nonischemic cardiomyopathy) (HCC)    a. 06/2012 Echo: EF 45-50%;  b. 08/2013 Echo: EF 20-25%;  c. 10/2013 Adenosine  MV: Fixed septal defect likely representing LBBB-related artifact, no reversible ischemia, EF 43%, low risk.   Paroxysmal atrial tachycardia (HCC)    a. 07/2013 noted on device interrogation.   Presence of permanent cardiac pacemaker  2013   RA (rheumatoid arthritis) (HCC)    S/P aortic valve replacement    a. 1993 s/p SJM mechanical AVR for aortic stenosis - chronic coumadin .   Shortness of breath dyspnea    Past Surgical History:  Procedure Laterality Date   ANAL FISSURE REPAIR  1970s   AORTIC VALVE REPLACEMENT  1993   St. Jude Mechanical Valve   CATARACT EXTRACTION, BILATERAL Bilateral 02/2020   1 wk apart   INSERT / REPLACE / REMOVE PACEMAKER     PACEMAKER INSERTION  1993   for AV block, not pacemaker dependant   PERMANENT PACEMAKER GENERATOR CHANGE N/A 01/25/2012   Procedure: PERMANENT PACEMAKER GENERATOR CHANGE;  Surgeon: Lynwood Rakers, MD;  Location: [facility] CATH LAB;  Service: Cardiovascular;  Laterality: N/A;   PPM GENERATOR CHANGEOUT N/A 07/06/2022   Procedure: PPM GENERATOR CHANGEOUT;  Surgeon: Noah Danelle ORN, MD;  Location: [facility] INVASIVE CV LAB;  Service: Cardiovascular;  Laterality: N/A;   TEE WITHOUT  CARDIOVERSION N/A 08/08/2021   Procedure: TRANSESOPHAGEAL ECHOCARDIOGRAM (TEE);  Surgeon: Noah Stanly LABOR, MD;  Location: [facility] ENDOSCOPY;  Service: Cardiovascular;  Laterality: N/A;  TAVR CT BEFORE TEE   Family History  Problem Relation Age of Onset   Arthritis Mother    Hypertension Mother    Arthritis Father    Heart disease Father    Prostate cancer Father 59   Hypertension Father    Congestive Heart Failure Father    Coronary artery disease Neg Hx    Stroke Neg Hx    Diabetes Neg Hx    Social History   Socioeconomic History   Marital status: Married    Spouse name: Not on file   Number of children: 1   Years of education: Not on file   Highest education level: Not on file  Occupational History   Occupation: retired  Tobacco Use   Smoking status: Never   Smokeless tobacco: Never  Vaping Use   Vaping status: Never Used  Substance and Sexual Activity   Alcohol use: No   Drug use: No   Sexual activity: Not Currently  Other Topics Concern   Not on file  Social History Narrative   Lives  with wife, 5 dogs.  grown children away.   Occupation: retired, was Teaching laboratory technician   Activity: welding   Diet: low sodium diet, good water, fruits/vegetables daily   Social Drivers of Corporate investment banker Strain: Low Risk  (01/22/2024)   Overall Financial Resource Strain (CARDIA)    Difficulty of Paying Living Expenses: Not hard at all  Food Insecurity: No Food Insecurity (01/22/2024)   Hunger Vital Sign    Worried About Running Out of Food in the Last Year: Never true    Ran Out of Food in the Last Year: Never true  Transportation Needs: No Transportation Needs (01/22/2024)   PRAPARE - Administrator, Civil Service (Medical): No    Lack of Transportation (Non-Medical): No  Physical Activity: Sufficiently Active (01/22/2024)   Exercise Vital Sign    Days of Exercise per Week: 7 days    Minutes of Exercise per Session: 30 min  Stress: No Stress Concern Present (01/22/2024)   Noah Thompson of Occupational Health - Occupational Stress Questionnaire    Feeling of Stress: Not at all  Social Connections: Socially Integrated (01/22/2024)   Social Connection and Isolation Panel    Frequency of Communication with Friends and Family: More than three times a week    Frequency of Social Gatherings with Friends and Family: More than three times a week    Attends Religious Services: More than 4 times per year    Active Member of [employer] or Organizations: Yes    Attends Engineer, structural: More than 4 times per year    Marital Status: Married    Tobacco Counseling Counseling given: Not Answered    Clinical Intake:  Pre-visit preparation completed: Yes  Pain : No/denies pain     BMI - recorded: 25.31 Nutritional Status: BMI 25 -29 Overweight Nutritional Risks: None Diabetes: No  No results found for: HGBA1C   How often do you need to have someone help you when you read instructions, pamphlets, or other written materials from your doctor or pharmacy?: 1  - Never  Interpreter Needed?: No  Information entered by :: Noah Thompson,cma   Activities of Daily Living     01/22/2024    4:11 PM  In your present state of health, do  you have any difficulty performing the following activities:  Hearing? 0  Vision? 0  Difficulty concentrating or making decisions? 0  Walking or climbing stairs? 0  Dressing or bathing? 0  Doing errands, shopping? 0  Preparing Food and eating ? N  Using the Toilet? N  In the past six months, have you accidently leaked urine? N  Do you have problems with loss of bowel control? N  Managing your Medications? N  Managing your Finances? N  Housekeeping or managing your Housekeeping? N    Patient Care Team: Noah Baller, MD as PCP - General (Family Medicine) Noah Stanly LABOR, MD as PCP - Cardiology (Cardiology) Noah Danelle ORN, MD as PCP - Electrophysiology (Cardiology)  I have updated your Care Teams any recent Medical Services you may have received from other providers in the past year.     Assessment:   This is a routine wellness examination for Noah Thompson.  Hearing/Vision screen Hearing Screening - Comments:: No difficulties Vision Screening - Comments:: Wears glasses   Goals Addressed             This Visit's Progress    Patient Stated       PATIENT would like to get housework done       Depression Screen     01/22/2024    4:13 PM 05/23/2022    3:53 PM 06/01/2020    4:15 PM 11/08/2018    1:26 PM 07/27/2017   11:35 AM 02/16/2016   11:33 AM  PHQ 2/9 Scores  PHQ - 2 Score 0 0 0 0 0 0  PHQ- 9 Score 0   0 0     Fall Risk     01/22/2024    4:11 PM 05/23/2022    3:53 PM 06/01/2020    4:14 PM 11/08/2018    1:26 PM 07/27/2017   11:35 AM  Fall Risk   Falls in the past year? 1 0 0 0  No   Number falls in past yr: 0 0     Injury with Fall? 0 0     Risk for fall due to : History of fall(s) No Fall Risks     Follow up Falls evaluation completed Falls evaluation completed          Data saved with a previous flowsheet row definition    MEDICARE RISK AT HOME:  Medicare Risk at Home Any stairs in or around the home?: Yes If so, are there any without handrails?: Yes Home free of loose throw rugs in walkways, pet beds, electrical cords, etc?: Yes Adequate lighting in your home to reduce risk of falls?: Yes Life alert?: No Use of a cane, walker or w/c?: No Grab bars in the bathroom?: No Shower chair or bench in shower?: No Elevated toilet seat or a handicapped toilet?: No  TIMED UP AND GO:  Was the test performed?  No  Cognitive Function: 6CIT completed    11/08/2018    1:51 PM 07/27/2017   11:36 AM  MMSE - Mini Mental State Exam  Orientation to time 5 5   Orientation to Place 5 5   Registration 3 3   Attention/ Calculation 0 0   Recall 3 3   Language- name 2 objects 0 0   Language- repeat 1 1  Language- follow 3 step command 0 3   Language- read & follow direction 0 0   Write a sentence 0 0   Copy design 0 0   Total score 17  20      Data saved with a previous flowsheet row definition        01/22/2024    4:09 PM  6CIT Screen  What Year? 0 points  What month? 0 points  What time? 0 points  Count back from 20 0 points  Months in reverse 0 points  Repeat phrase 0 points  Total Score 0 points    Immunizations Immunization History  Administered Date(s) Administered   Fluad Quad(high Dose 65+) 04/03/2019, 04/06/2020, 06/03/2021   Fluad Trivalent(High Dose 65+) 04/30/2023   Influenza Whole 05/08/2005, 05/12/2009, 05/05/2010   Influenza, High Dose Seasonal PF 05/14/2017, 04/03/2018   Influenza, Seasonal, Injecte, Preservative Fre 07/05/2012   Influenza,inj,Quad PF,6+ Mos 07/24/2013, 04/15/2014, 06/06/2016, 05/23/2022   Influenza-Unspecified 04/03/2003, 04/02/2004, 04/02/2005, 04/30/2006   Pneumococcal Conjugate-13 02/16/2016   Pneumococcal Polysaccharide-23 04/09/2002   Td (Adult),unspecified 04/09/2002    Screening Tests Health Maintenance   Topic Date Due   COVID-19 Vaccine (1) Never done   Zoster Vaccines- Shingrix (1 of 2) Never done   DTaP/Tdap/Td (1 - Tdap) 04/10/2002   Pneumococcal Vaccine: 50+ Years (3 of 3 - PCV20 or PCV21) 04/12/2016   INFLUENZA VACCINE  02/01/2024   Medicare Annual Wellness (AWV)  01/21/2025   Hepatitis B Vaccines  Aged Out   HPV VACCINES  Aged Out   Meningococcal B Vaccine  Aged Out   Hepatitis C Screening  Discontinued    Health Maintenance  Health Maintenance Due  Topic Date Due   COVID-19 Vaccine (1) Never done   Zoster Vaccines- Shingrix (1 of 2) Never done   DTaP/Tdap/Td (1 - Tdap) 04/10/2002   Pneumococcal Vaccine: 50+ Years (3 of 3 - PCV20 or PCV21) 04/12/2016   Health Maintenance Items Addressed:   Additional Screening:  Vision Screening: Recommended annual ophthalmology exams for early detection of glaucoma and other disorders of the eye. Would you like a referral to an eye doctor? No    Dental Screening: Recommended annual dental exams for proper oral hygiene  Community Resource Referral / Chronic Care Management: CRR required this visit?  No   CCM required this visit?  No   Plan:    I have personally reviewed and noted the following in the patient's chart:   Medical and social history Use of alcohol, tobacco or illicit drugs  Current medications and supplements including opioid prescriptions. Patient is not currently taking opioid prescriptions. Functional ability and status Nutritional status Physical activity Advanced directives List of other physicians Hospitalizations, surgeries, and ER visits in previous 12 months Vitals Screenings to include cognitive, depression, and falls Referrals and appointments  In addition, I have reviewed and discussed with patient certain preventive protocols, quality metrics, and best practice recommendations. A written personalized care plan for preventive services as well as general preventive health recommendations were  provided to patient.   Noah Thompson Right, [place]   01/22/2024   After Visit Summary: (MyChart) Due to this being a telephonic visit, the after visit summary with patients personalized plan was offered to patient via MyChart   Notes: Nothing significant to report at this time.

## 2024-01-23 DIAGNOSIS — L905 Scar conditions and fibrosis of skin: Secondary | ICD-10-CM | POA: Diagnosis not present

## 2024-01-23 DIAGNOSIS — C44319 Basal cell carcinoma of skin of other parts of face: Secondary | ICD-10-CM | POA: Diagnosis not present

## 2024-01-31 ENCOUNTER — Ambulatory Visit (INDEPENDENT_AMBULATORY_CARE_PROVIDER_SITE_OTHER)

## 2024-01-31 DIAGNOSIS — Z7901 Long term (current) use of anticoagulants: Secondary | ICD-10-CM

## 2024-01-31 LAB — POCT INR: INR: 2.2 (ref 2.0–3.0)

## 2024-01-31 NOTE — Patient Instructions (Addendum)
 Pre visit review using our clinic review tool, if applicable. No additional management support is needed unless otherwise documented below in the visit note.  Increase dose today to take 1 tablet and then change weekly dose to take 1/2 tablet daily except take 1 tablet on Mondays and Thursdays. Recheck in 2 weeks.

## 2024-01-31 NOTE — Progress Notes (Signed)
 Increase dose today to take 1 tablet and then change weekly dose to take 1/2 tablet daily except take 1 tablet on Mondays and Thursdays. Recheck in 2 weeks.

## 2024-02-14 ENCOUNTER — Ambulatory Visit (INDEPENDENT_AMBULATORY_CARE_PROVIDER_SITE_OTHER)

## 2024-02-14 DIAGNOSIS — Z7901 Long term (current) use of anticoagulants: Secondary | ICD-10-CM

## 2024-02-14 LAB — POCT INR: INR: 2.4 (ref 2.0–3.0)

## 2024-02-14 NOTE — Progress Notes (Signed)
 Pt reports eating more salad. Advised to continue this but eat salads consistently, such as 3 x wk. Pt verbalized understanding.  Advised pt of his physical scheduled with PCP on 8/26. Offered for INR to be performed at that apt. Pt declined and said he would rather come to his coumadin clinic apt on 8/28.  Increase dose today to take 1 1/2 tablet and then change weekly dose to take 1/2 tablet daily except take 1 tablet on Mondays, Wednesday and Friday. Recheck in 2 weeks.

## 2024-02-14 NOTE — Patient Instructions (Addendum)
 Pre visit review using our clinic review tool, if applicable. No additional management support is needed unless otherwise documented below in the visit note.  Increase dose today to take 1 1/2 tablet and then change weekly dose to take 1/2 tablet daily except take 1 tablet on Mondays, Wednesday and Friday. Recheck in 2 weeks.

## 2024-02-26 ENCOUNTER — Encounter: Payer: Self-pay | Admitting: Family Medicine

## 2024-02-26 ENCOUNTER — Ambulatory Visit (INDEPENDENT_AMBULATORY_CARE_PROVIDER_SITE_OTHER): Admitting: Family Medicine

## 2024-02-26 VITALS — BP 98/68 | HR 72 | Temp 97.4°F | Ht 70.0 in | Wt 180.2 lb

## 2024-02-26 DIAGNOSIS — I7121 Aneurysm of the ascending aorta, without rupture: Secondary | ICD-10-CM | POA: Diagnosis not present

## 2024-02-26 DIAGNOSIS — E782 Mixed hyperlipidemia: Secondary | ICD-10-CM

## 2024-02-26 DIAGNOSIS — I130 Hypertensive heart and chronic kidney disease with heart failure and stage 1 through stage 4 chronic kidney disease, or unspecified chronic kidney disease: Secondary | ICD-10-CM

## 2024-02-26 DIAGNOSIS — I5042 Chronic combined systolic (congestive) and diastolic (congestive) heart failure: Secondary | ICD-10-CM | POA: Diagnosis not present

## 2024-02-26 DIAGNOSIS — Z23 Encounter for immunization: Secondary | ICD-10-CM

## 2024-02-26 DIAGNOSIS — Z950 Presence of cardiac pacemaker: Secondary | ICD-10-CM

## 2024-02-26 DIAGNOSIS — Z95811 Presence of heart assist device: Secondary | ICD-10-CM | POA: Insufficient documentation

## 2024-02-26 DIAGNOSIS — N183 Chronic kidney disease, stage 3 unspecified: Secondary | ICD-10-CM | POA: Diagnosis not present

## 2024-02-26 DIAGNOSIS — K219 Gastro-esophageal reflux disease without esophagitis: Secondary | ICD-10-CM

## 2024-02-26 DIAGNOSIS — I10 Essential (primary) hypertension: Secondary | ICD-10-CM

## 2024-02-26 DIAGNOSIS — Z7189 Other specified counseling: Secondary | ICD-10-CM

## 2024-02-26 DIAGNOSIS — Z0000 Encounter for general adult medical examination without abnormal findings: Secondary | ICD-10-CM

## 2024-02-26 DIAGNOSIS — Z952 Presence of prosthetic heart valve: Secondary | ICD-10-CM

## 2024-02-26 DIAGNOSIS — N1831 Chronic kidney disease, stage 3a: Secondary | ICD-10-CM

## 2024-02-26 DIAGNOSIS — I428 Other cardiomyopathies: Secondary | ICD-10-CM | POA: Diagnosis not present

## 2024-02-26 NOTE — Assessment & Plan Note (Signed)
 Chronic, off statin. Update FLP - although not truly fasting. The ASCVD Risk score (Arnett DK, et al., 2019) failed to calculate for the following reasons:   The 2019 ASCVD risk score is only valid for ages 85 to 60

## 2024-02-26 NOTE — Assessment & Plan Note (Addendum)
 St Jude pacemaker placed  remotely

## 2024-02-26 NOTE — Assessment & Plan Note (Signed)
 Chronic, BP low today however without hypotensive symptoms. Continue Entresto  through cardiology.

## 2024-02-26 NOTE — Assessment & Plan Note (Addendum)
 H/o this, however latest GFR 60s , update levels today.

## 2024-02-26 NOTE — Assessment & Plan Note (Signed)
 Stable period off  PPI - will remove from med list

## 2024-02-26 NOTE — Assessment & Plan Note (Signed)
 Advanced directive discussion - has not set up, working on this. Would want wife to be HCPOA. Packet provided today.

## 2024-02-26 NOTE — Patient Instructions (Addendum)
 Prevnar-20 today  Labs today. Advanced directive packet provided today  If interested, check with pharmacy about new 2 shot shingles series (shingrix).  Good to see you today Return as needed or in 1 year for next physical.

## 2024-02-26 NOTE — Assessment & Plan Note (Addendum)
 Regularly sees cardiology.  Recently extra wire was placed

## 2024-02-26 NOTE — Progress Notes (Addendum)
 Ph: [phone] Fax: [phone]   Patient ID: Noah CASEBOLT, male    DOB: February 08, 1941, 83 y.o.   MRN: [id number]  This visit was conducted in person.  BP 98/68   Pulse 72   Temp (!) 97.4 F (36.3 C) (Oral)   Ht 5' 10 (1.778 m)   Wt 180 lb 4 oz (81.8 kg)   SpO2 97%   BMI 25.86 kg/m   BP Readings from Last 3 Encounters:  02/26/24 98/68  01/22/24 124/72  12/18/23 124/72   CC: CPE Subjective:   HPI: Noah Thompson is a 83 y.o. male presenting on 02/26/2024 for Annual Exam (Patient is accompanied by wife [place] and Psychiatrist)   Last seen 05/2022.   Saw health advisor last month for medicare wellness visit. Note reviewed.    No results found.  Flowsheet Row Office Visit from 02/26/2024 in [facility] [place]  PHQ-2 Total Score 0       02/26/2024   12:34 PM 01/22/2024    4:11 PM 05/23/2022    3:53 PM 06/01/2020    4:14 PM 11/08/2018    1:26 PM  Fall Risk   Falls in the past year? 0 1 0 0 0   Number falls in past yr: 0 0 0    Injury with Fall? 0 0 0    Risk for fall due to : No Fall Risks History of fall(s) No Fall Risks    Follow up Falls evaluation completed Falls evaluation completed Falls evaluation completed        Data saved with a previous flowsheet row definition   Known prosthetic St Jude mechanical aortic valve, with trivial AR. Severe prosthetic dysfunction noted on TEE 08/2021. HFrEF with EF 30-35%. Mod MR. 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 On coumadin  (goal 2.5-3.5) for 30+ yrs. Needs dental prophylaxis.   Regularly sees cardiology, last seen 12/2023.  Lab Results  Component Value Date   INR 2.4 02/28/2024   INR 2.4 02/14/2024   INR 2.2 01/31/2024   PROTIME 19.9 12/07/2008   H/o trouble tolerating chol medications.   Preventative: Colon cancer screening - cologuard negative 05/2016. Previously aged out. No blood in stool or bowel changes.   Prostate cancer screening - previously normal at [place]. fmhx (father age 32). Nocturia x2.  Lab Results  Component Value Date   PSA 1.77 05/24/2020   PSA 5.00 (H) 08/03/2017   PSA 1.99 12/14/2011  Lung cancer screening - not eligible  Flu shot yearly  Tetanus shot - ?declines  COVID vaccine - declined  Prevnar-13 02/2016.  Shingrix - interested - to check with pharmacy.  Advanced directive discussion - has not set up, working on this. Would want wife to be HCPOA. Packet provided today. Seat belt use discussed Sunscreen use discussed. No changing moles on skin.  Non smoker Alcohol - none Dentist yearly Eye exam yearly Bowel - no constipation Bladder - no incontinence   Lives with wife, 5 dogs. Grown children away. Occupation: retired, was Teaching laboratory technician Activity: walking regularly, continues welding Diet: low sodium diet, good water, fruits/vegetables daily     Relevant past medical, surgical, family and social history reviewed and updated as indicated. Interim medical history since our last visit reviewed. Allergies and medications reviewed and updated. Outpatient Medications Prior to Visit  Medication Sig Dispense Refill   acetaminophen  (TYLENOL ) 500 MG tablet Take 500-1,000 mg by mouth every  6 (six) hours as needed (for pain.).     alum & mag hydroxide-simeth (MAALOX/MYLANTA) 200-200-20 MG/5ML suspension Take 15 mLs by mouth every 6 (six) hours as needed for indigestion or heartburn.     famotidine  (PEPCID ) 20 MG tablet Take 1 tablet (20 mg total) by mouth at bedtime.     guaiFENesin  (MUCINEX ) 600 MG 12 hr tablet Take 600 mg by mouth 2 (two) times daily as needed (congestion/cough).     loperamide  (IMODIUM ) 2 MG capsule Take 1 capsule (2 mg total) by mouth 4 (four) times daily as needed for diarrhea or loose stools.     metoprolol  succinate (TOPROL -XL) 50 MG 24 hr tablet Take one tablet by mouth daily. Take with or immediately following a meal. 90 tablet 1   Misc Natural Products  (IMMUNE FORMULA PO) Take 1-2 tablets by mouth in the morning. Vitafusion Triple Immune Power Gummy Vitamins     warfarin (COUMADIN ) 5 MG tablet TAKE 1/2 TABLET BY MOUTH DAILY EXCEPT TAKE 1 TABLET ON MONDAYS, WEDNESDAYS AND FRIDAYS OR AS DIRECTED BY ANTICOAGULATION CLINIC 110 tablet 1   omeprazole  (PRILOSEC) 40 MG capsule Take 1 capsule (40 mg total) by mouth daily. Take daily for 3 days then as needed 30 capsule 3   amoxicillin (AMOXIL) 500 MG capsule Take 2,000 mg by mouth See admin instructions. Take 4 capsules (2000 mg) by mouth 1 hour prior to dental appointment (Patient not taking: Reported on 02/26/2024)     loratadine  (CLARITIN ) 10 MG tablet Take 1 tablet (10 mg total) by mouth daily as needed for allergies. (Patient not taking: Reported on 02/26/2024)     sacubitril -valsartan  (ENTRESTO ) 97-103 MG Take 1 tablet by mouth 2 (two) times daily. (Patient not taking: Reported on 02/26/2024) 180 tablet 3   triamcinolone  cream (KENALOG ) 0.1 % Apply 1 Application topically in the morning and at bedtime. For max 2 weeks at a time (Patient not taking: Reported on 02/26/2024) 80 g 0   cyanocobalamin  (V-R VITAMIN B-12) 500 MCG tablet Take 1 tablet (500 mcg total) by mouth in the morning and at bedtime. (Patient not taking: Reported on 02/26/2024)     diphenhydrAMINE (BENADRYL) 25 MG tablet Take 25-50 mg by mouth daily as needed for allergies. (Patient not taking: Reported on 02/26/2024)     No facility-administered medications prior to visit.     Per HPI unless specifically indicated in ROS section below Review of Systems  Constitutional:  Negative for activity change, appetite change, chills, fatigue, fever and unexpected weight change.  HENT:  Negative for hearing loss.   Eyes:  Negative for visual disturbance.  Respiratory:  Negative for cough, chest tightness, shortness of breath and wheezing.   Cardiovascular:  Negative for chest pain, palpitations and leg swelling.  Gastrointestinal:  Negative for  abdominal distention, abdominal pain, blood in stool, constipation, diarrhea, nausea and vomiting.  Genitourinary:  Negative for difficulty urinating and hematuria.  Musculoskeletal:  Negative for arthralgias, myalgias and neck pain.  Skin:  Negative for rash.  Neurological:  Negative for dizziness, seizures, syncope and headaches.  Hematological:  Negative for adenopathy. Bruises/bleeds easily.  Psychiatric/Behavioral:  Negative for dysphoric mood. The patient is not nervous/anxious.     Objective:  BP 98/68   Pulse 72   Temp (!) 97.4 F (36.3 C) (Oral)   Ht 5' 10 (1.778 m)   Wt 180 lb 4 oz (81.8 kg)   SpO2 97%   BMI 25.86 kg/m   Wt Readings from Last 3 Encounters:  02/26/24 180 lb 4 oz (81.8 kg)  01/22/24 184 lb (83.5 kg)  12/18/23 184 lb 3.2 oz (83.6 kg)      Physical Exam Vitals and nursing note reviewed.  Constitutional:      General: He is not in acute distress.    Appearance: Normal appearance. He is well-developed. He is not ill-appearing.  HENT:     Head: Normocephalic and atraumatic.     Right Ear: Hearing, tympanic membrane, ear canal and external ear normal.     Left Ear: Hearing, tympanic membrane, ear canal and external ear normal.     Nose: Nose normal.     Mouth/Throat:     Mouth: Mucous membranes are moist.     Pharynx: Oropharynx is clear. No oropharyngeal exudate or posterior oropharyngeal erythema.  Eyes:     General: No scleral icterus.    Extraocular Movements: Extraocular movements intact.     Conjunctiva/sclera: Conjunctivae normal.     Pupils: Pupils are equal, round, and reactive to light.  Neck:     Thyroid : No thyroid  mass or thyromegaly.     Vascular: No carotid bruit.  Cardiovascular:     Rate and Rhythm: Normal rate and regular rhythm.     Pulses: Normal pulses.          Radial pulses are 2+ on the right side and 2+ on the left side.     Heart sounds: Murmur (mechanical valve systolic murmur) heard.  Pulmonary:     Effort: Pulmonary  effort is normal. No respiratory distress.     Breath sounds: Normal breath sounds. No wheezing, rhonchi or rales.  Abdominal:     General: Bowel sounds are normal. There is no distension.     Palpations: Abdomen is soft. There is no mass.     Tenderness: There is no abdominal tenderness. There is no guarding or rebound.     Hernia: No hernia is present.  Musculoskeletal:        General: Normal range of motion.     Cervical back: Normal range of motion and neck supple.     Right lower leg: No edema.     Left lower leg: No edema.  Lymphadenopathy:     Cervical: No cervical adenopathy.  Skin:    General: Skin is warm and dry.     Findings: No rash.  Neurological:     General: No focal deficit present.     Mental Status: He is alert and oriented to person, place, and time.  Psychiatric:        Mood and Affect: Mood normal.        Behavior: Behavior normal.        Thought Content: Thought content normal.        Judgment: Judgment normal.       Results for orders placed or performed in visit on 02/26/24  Lipid panel   Collection Time: 02/26/24  1:06 PM  Result Value Ref Range   Cholesterol 202 (H) 0 - 200 mg/dL   Triglycerides 822.9 (H) 0.0 - 149.0 mg/dL   HDL 51.69 >60.99 mg/dL   VLDL 64.5 0.0 - 59.9 mg/dL   LDL Cholesterol 880 (H) 0 - 99 mg/dL   Total CHOL/HDL Ratio 4    NonHDL 154.12   Comprehensive metabolic panel with GFR   Collection Time: 02/26/24  1:06 PM  Result Value Ref Range   Sodium 141 135 - 145 mEq/L   Potassium 4.6 3.5 - 5.1 mEq/L  Chloride 104 96 - 112 mEq/L   CO2 28 19 - 32 mEq/L   Glucose, Bld 97 70 - 99 mg/dL   BUN 20 6 - 23 mg/dL   Creatinine, Ser 8.56 0.40 - 1.50 mg/dL   Total Bilirubin 0.6 0.2 - 1.2 mg/dL   Alkaline Phosphatase 62 39 - 117 U/L   AST 8 0 - 37 U/L   ALT 10 0 - 53 U/L   Total Protein 6.3 6.0 - 8.3 g/dL   Albumin 4.3 3.5 - 5.2 g/dL   GFR 54.57 (L) >39.99 mL/min   Calcium 9.1 8.4 - 10.5 mg/dL  TSH   Collection Time: 02/26/24   1:06 PM  Result Value Ref Range   TSH 2.35 0.35 - 5.50 uIU/mL  CBC with Differential/Platelet   Collection Time: 02/26/24  1:06 PM  Result Value Ref Range   WBC 7.8 4.0 - 10.5 K/uL   RBC 4.75 4.22 - 5.81 Mil/uL   Hemoglobin 14.0 13.0 - 17.0 g/dL   HCT 58.1 60.9 - 47.9 %   MCV 88.1 78.0 - 100.0 fl   MCHC 33.4 30.0 - 36.0 g/dL   RDW 85.7 88.4 - 84.4 %   Platelets 247.0 150.0 - 400.0 K/uL   Neutrophils Relative % 50.3 43.0 - 77.0 %   Lymphocytes Relative 25.5 12.0 - 46.0 %   Monocytes Relative 9.3 3.0 - 12.0 %   Eosinophils Relative 14.3 (H) 0.0 - 5.0 %   Basophils Relative 0.6 0.0 - 3.0 %   Neutro Abs 3.9 1.4 - 7.7 K/uL   Lymphs Abs 2.0 0.7 - 4.0 K/uL   Monocytes Absolute 0.7 0.1 - 1.0 K/uL   Eosinophils Absolute 1.1 (H) 0.0 - 0.7 K/uL   Basophils Absolute 0.0 0.0 - 0.1 K/uL   Lab Results  Component Value Date   CHOL 202 (H) 02/26/2024   HDL 48.30 02/26/2024   LDLCALC 119 (H) 02/26/2024   LDLDIRECT 157.0 02/16/2016   TRIG 177.0 (H) 02/26/2024   CHOLHDL 4 02/26/2024    Lab Results  Component Value Date   NA 141 02/26/2024   CL 104 02/26/2024   K 4.6 02/26/2024   CO2 28 02/26/2024   BUN 20 02/26/2024   CREATININE 1.43 02/26/2024   EGFR 62 06/20/2022   CALCIUM 9.1 02/26/2024   PHOS 2.9 05/05/2010   ALBUMIN 4.3 02/26/2024   GLUCOSE 97 02/26/2024    Lab Results  Component Value Date   ALT 10 02/26/2024   AST 8 02/26/2024   ALKPHOS 62 02/26/2024   BILITOT 0.6 02/26/2024    Lab Results  Component Value Date   WBC 7.8 02/26/2024   HGB 14.0 02/26/2024   HCT 41.8 02/26/2024   MCV 88.1 02/26/2024   PLT 247.0 02/26/2024  No results found for: VITAMINB12   Assessment & Plan:   Problem List Items Addressed This Visit     Health maintenance examination - Primary (Chronic)   Preventative protocols reviewed and updated unless pt declined. Discussed healthy diet and lifestyle.       Advanced care planning/counseling discussion (Chronic)   Advanced directive  discussion - has not set up, working on this. Would want wife to be HCPOA. Packet provided today.      HLD (hyperlipidemia)   Chronic, off statin. Update FLP - although not truly fasting. The ASCVD Risk score (Arnett DK, et al., 2019) failed to calculate for the following reasons:   The 2019 ASCVD risk score is only valid for ages 10 to 88  Relevant Orders   Lipid panel (Completed)   Comprehensive metabolic panel with GFR (Completed)   TSH (Completed)   Essential hypertension, benign   Chronic, BP low today however without hypotensive symptoms. Continue Entresto  through cardiology.       Nonischemic cardiomyopathy (HCC)   S/P aortic valve replacement   Continue coumadin  through our coumadin  clinic.       Relevant Orders   CBC with Differential/Platelet (Completed)   GERD (gastroesophageal reflux disease)   Stable period off  PPI - will remove from med list       CKD (chronic kidney disease) stage 3, GFR 30-59 ml/min (HCC)   H/o this, however latest GFR 60s , update levels today.       Aneurysm of ascending aorta without rupture [facility])   This is being followed by cardiology. Reviewed good BP control.      Chronic combined systolic and diastolic heart failure [facility])   Regularly sees cardiology.  Recently extra wire was placed      Presence of heart assist device (HCC)   St Jude pacemaker placed  remotely      Other Visit Diagnoses       Need for vaccination against Streptococcus pneumoniae       Relevant Orders   Pneumococcal conjugate vaccine 20-valent (Prevnar 20) (Completed)        No orders of the defined types were placed in this encounter.   Orders Placed This Encounter  Procedures   Pneumococcal conjugate vaccine 20-valent (Prevnar 20)   Lipid panel   Comprehensive metabolic panel with GFR   TSH   CBC with Differential/Platelet    Patient Instructions  Prevnar-20 today  Labs today. Advanced directive packet provided today  If  interested, check with pharmacy about new 2 shot shingles series (shingrix).  Good to see you today Return as needed or in 1 year for next physical.   Follow up plan: Return in about 1 year (around 02/25/2025) for annual exam, prior fasting for blood work, medicare wellness visit.  Anton Blas, MD

## 2024-02-26 NOTE — Assessment & Plan Note (Addendum)
 This is being followed by cardiology. Reviewed good BP control.

## 2024-02-26 NOTE — Assessment & Plan Note (Addendum)
 Continue coumadin  through our coumadin  clinic.

## 2024-02-26 NOTE — Assessment & Plan Note (Signed)
 Preventative protocols reviewed and updated unless pt declined. Discussed healthy diet and lifestyle.

## 2024-02-27 LAB — COMPREHENSIVE METABOLIC PANEL WITH GFR
ALT: 10 U/L (ref 0–53)
AST: 8 U/L (ref 0–37)
Albumin: 4.3 g/dL (ref 3.5–5.2)
Alkaline Phosphatase: 62 U/L (ref 39–117)
BUN: 20 mg/dL (ref 6–23)
CO2: 28 meq/L (ref 19–32)
Calcium: 9.1 mg/dL (ref 8.4–10.5)
Chloride: 104 meq/L (ref 96–112)
Creatinine, Ser: 1.43 mg/dL (ref 0.40–1.50)
GFR: 45.42 mL/min — ABNORMAL LOW (ref >60.00)
Glucose, Bld: 97 mg/dL (ref 70–99)
Potassium: 4.6 meq/L (ref 3.5–5.1)
Sodium: 141 meq/L (ref 135–145)
Total Bilirubin: 0.6 mg/dL (ref 0.2–1.2)
Total Protein: 6.3 g/dL (ref 6.0–8.3)

## 2024-02-27 LAB — CBC WITH DIFFERENTIAL/PLATELET
Basophils Absolute: 0 K/uL (ref 0.0–0.1)
Basophils Relative: 0.6 % (ref 0.0–3.0)
Eosinophils Absolute: 1.1 K/uL — ABNORMAL HIGH (ref 0.0–0.7)
Eosinophils Relative: 14.3 % — ABNORMAL HIGH (ref 0.0–5.0)
HCT: 41.8 % (ref 39.0–52.0)
Hemoglobin: 14 g/dL (ref 13.0–17.0)
Lymphocytes Relative: 25.5 % (ref 12.0–46.0)
Lymphs Abs: 2 K/uL (ref 0.7–4.0)
MCHC: 33.4 g/dL (ref 30.0–36.0)
MCV: 88.1 fl (ref 78.0–100.0)
Monocytes Absolute: 0.7 K/uL (ref 0.1–1.0)
Monocytes Relative: 9.3 % (ref 3.0–12.0)
Neutro Abs: 3.9 K/uL (ref 1.4–7.7)
Neutrophils Relative %: 50.3 % (ref 43.0–77.0)
Platelets: 247 K/uL (ref 150.0–400.0)
RBC: 4.75 Mil/uL (ref 4.22–5.81)
RDW: 14.2 % (ref 11.5–15.5)
WBC: 7.8 K/uL (ref 4.0–10.5)

## 2024-02-27 LAB — LIPID PANEL
Cholesterol: 202 mg/dL — ABNORMAL HIGH (ref 0–200)
HDL: 48.3 mg/dL (ref >39.00)
LDL Cholesterol: 119 mg/dL — ABNORMAL HIGH (ref 0–99)
NonHDL: 154.12
Total CHOL/HDL Ratio: 4
Triglycerides: 177 mg/dL — ABNORMAL HIGH (ref 0.0–149.0)
VLDL: 35.4 mg/dL (ref 0.0–40.0)

## 2024-02-27 LAB — TSH: TSH: 2.35 u[IU]/mL (ref 0.35–5.50)

## 2024-02-28 ENCOUNTER — Telehealth: Payer: Self-pay

## 2024-02-28 ENCOUNTER — Ambulatory Visit (INDEPENDENT_AMBULATORY_CARE_PROVIDER_SITE_OTHER)

## 2024-02-28 DIAGNOSIS — Z7901 Long term (current) use of anticoagulants: Secondary | ICD-10-CM | POA: Diagnosis not present

## 2024-02-28 LAB — POCT INR: INR: 2.4 (ref 2.0–3.0)

## 2024-02-28 MED ORDER — OMEPRAZOLE 40 MG PO CPDR: 40.0000 mg | DELAYED_RELEASE_CAPSULE | Freq: Every day | ORAL | 3 refills | Status: AC | PRN

## 2024-02-28 NOTE — Telephone Encounter (Signed)
 Pt was in coumadin  clinic today. Pt reports omeprazole  has been taken off of his medication list and he is not sure why. His wife reports he does need a refill of this. He takes as needed and was off for quite some time but does need to take it. Advised PCP note on 8/26 reported pt was not taking anymore. Advised a msg will be sent to PCP requesting refill of the med. Advised if any problems to contact the clinic. Pt verbalized understanding.

## 2024-02-28 NOTE — Patient Instructions (Addendum)
 Pre visit review using our clinic review tool, if applicable. No additional management support is needed unless otherwise documented below in the visit note.  Increase dose today to take 1 tablet and then continue 1/2 tablet daily except take 1 tablet on Mondays, Wednesday and Friday. Recheck in 2 weeks.

## 2024-02-28 NOTE — Progress Notes (Signed)
 Pt reports eating more salad. Advised to continue this but eat salads consistently, such as 3 x wk. Pt verbalized understanding.  Pt missed dose, 5 mg, 3 days ago. Pt also reports omeprazole  has been taken off of his medication list and he is not sure why. His wife reports he does need a refill of this. He takes as needed and was off for quite some time but does need to take it. Advised PCP note reported pt was not taking anymore. Advised a msg will be sent to PCP requesting refill of the med. Advised if any problems to contact the clinic. Pt verbalized understanding.  Since pt missed dose 3 days ago there will not be any change to weekly dosing. Will increase dose today and then continue this dosing for 2 more weeks.  Increase dose today to take 1 tablet and then continue 1/2 tablet daily except take 1 tablet on Mondays, Wednesday and Friday. Recheck in 2 weeks.  Sent msg to PCP.

## 2024-02-28 NOTE — Telephone Encounter (Signed)
 Omeprazole  40mg  tablets sent to pharmacy thanks.  #30, RF3

## 2024-02-29 ENCOUNTER — Ambulatory Visit: Payer: Self-pay | Admitting: Family Medicine

## 2024-02-29 DIAGNOSIS — N289 Disorder of kidney and ureter, unspecified: Secondary | ICD-10-CM

## 2024-02-29 DIAGNOSIS — D721 Eosinophilia, unspecified: Secondary | ICD-10-CM

## 2024-03-06 ENCOUNTER — Telehealth: Payer: Self-pay | Admitting: Family Medicine

## 2024-03-06 NOTE — Telephone Encounter (Unsigned)
 Copied from CRM [id number]. Topic: General - Other >> Mar 05, 2024  4:42 PM [place] J wrote: Reason for CRM: Patient and his wife received a voicemail from [place] in regards to changing their appointment. I could not find any notes about rescheduling an appointment and the patient would like to make sure there wasn't any miscommunication. Please call patient and his wife back with more details on what appointment needs to be changed.

## 2024-03-07 DIAGNOSIS — D721 Eosinophilia, unspecified: Secondary | ICD-10-CM | POA: Insufficient documentation

## 2024-03-07 DIAGNOSIS — N289 Disorder of kidney and ureter, unspecified: Secondary | ICD-10-CM | POA: Insufficient documentation

## 2024-03-07 NOTE — Telephone Encounter (Signed)
 Pt had AMW by Lyle Right on 01/22/2024. Doesn't need another one this year.

## 2024-03-07 NOTE — Telephone Encounter (Addendum)
 Please notify regarding labs from last week: Cholesterol levels were mildly elevated but overall improved. Contnue working on low cholesterol diet, increased fiber can help lower LDL cholesterol.  Thyroid , liver, and sugar levels returned normal. Kidney function was impaired with filtering rate down to 45 -  ensure staying well hydrated with plenty of water. In setting of low blood pressures on last visit, will forward to cardiology as may need change in Entresto  or Toprol  XL dosing. I also want him to monitor BP at home and let us  know if consistently <110/60.  Blood counts returned overall ok, one type of white blood cell, eosinophils, were elevated. Any history of asthma or significant allergies?  We should recheck blood counts and kidney function in about 3 months - schedule lab visit for this. Labs ordered.  Let me know if any questions in the interim

## 2024-03-12 NOTE — Telephone Encounter (Unsigned)
 Copied from CRM [id number]. Topic: Clinical - Lab/Test Results >> Mar 12, 2024  2:52 PM Noah Thompson wrote: Reason for CRM: pt wife called in he have not received lab results, pt do not use my chart, you want someone to send results or call with  the results. [phone]

## 2024-03-13 ENCOUNTER — Ambulatory Visit (INDEPENDENT_AMBULATORY_CARE_PROVIDER_SITE_OTHER)

## 2024-03-13 DIAGNOSIS — Z7901 Long term (current) use of anticoagulants: Secondary | ICD-10-CM | POA: Diagnosis not present

## 2024-03-13 LAB — POCT INR: INR: 2.7 (ref 2.0–3.0)

## 2024-03-13 NOTE — Progress Notes (Signed)
 Continue 1/2 tablet daily except take 1 tablet on Mondays, Wednesday and Friday. Recheck in 4 weeks.  Pt also requested lab results from last visit. These were obtained and printed for the pt. Advised pt of PCP comments. Pt has been scheduled for labs in 3 months.

## 2024-03-13 NOTE — Telephone Encounter (Signed)
See lab results for further documentation.  

## 2024-03-13 NOTE — Telephone Encounter (Signed)
 Pt was in coumadin  clinic today and requested lab results. These were printed for pt and also the msg from PCP. Pt verbalized understanding and was appreciative.   Pt denies any asthma or severe allergies. Pt has been monitoring BP and will let office know if below stated parameters.   Scheduled pt for 3 month labs

## 2024-03-13 NOTE — Patient Instructions (Addendum)
 Pre visit review using our clinic review tool, if applicable. No additional management support is needed unless otherwise documented below in the visit note.  Continue 1/2 tablet daily except take 1 tablet on Mondays, Wednesday and Friday. Recheck in 4 weeks.

## 2024-04-01 ENCOUNTER — Ambulatory Visit (INDEPENDENT_AMBULATORY_CARE_PROVIDER_SITE_OTHER)

## 2024-04-01 DIAGNOSIS — Z23 Encounter for immunization: Secondary | ICD-10-CM | POA: Diagnosis not present

## 2024-04-01 NOTE — Progress Notes (Signed)
 Per orders of Dr. Anton Blas, injection of Flu Vaccine given by Nellie Hummer in left deltoid. Patient tolerated injection well.

## 2024-04-03 ENCOUNTER — Ambulatory Visit: Payer: Medicare Other

## 2024-04-03 DIAGNOSIS — R001 Bradycardia, unspecified: Secondary | ICD-10-CM

## 2024-04-04 LAB — CUP PACEART REMOTE DEVICE CHECK
Battery Remaining Longevity: 48 mo
Battery Remaining Percentage: 76 %
Battery Voltage: 2.98 V
Brady Statistic AP VP Percent: 77 %
Brady Statistic AP VS Percent: 1 %
Brady Statistic AS VP Percent: 17 %
Brady Statistic AS VS Percent: 1 %
Brady Statistic RA Percent Paced: 74 %
Date Time Interrogation Session: 20251002020012
Implantable Lead Connection Status: 753985
Implantable Lead Connection Status: 753985
Implantable Lead Connection Status: 753985
Implantable Lead Implant Date: 19930223
Implantable Lead Implant Date: 19930223
Implantable Lead Implant Date: 20240104
Implantable Lead Location: 753858
Implantable Lead Location: 753859
Implantable Lead Location: 753860
Implantable Pulse Generator Implant Date: 20240104
Lead Channel Impedance Value: 460 Ohm
Lead Channel Impedance Value: 460 Ohm
Lead Channel Impedance Value: 510 Ohm
Lead Channel Pacing Threshold Amplitude: 0.75 V
Lead Channel Pacing Threshold Amplitude: 1 V
Lead Channel Pacing Threshold Amplitude: 1.5 V
Lead Channel Pacing Threshold Pulse Width: 0.5 ms
Lead Channel Pacing Threshold Pulse Width: 0.5 ms
Lead Channel Pacing Threshold Pulse Width: 0.7 ms
Lead Channel Sensing Intrinsic Amplitude: 12 mV
Lead Channel Sensing Intrinsic Amplitude: 3.4 mV
Lead Channel Setting Pacing Amplitude: 2 V
Lead Channel Setting Pacing Amplitude: 2.5 V
Lead Channel Setting Pacing Amplitude: 2.75 V
Lead Channel Setting Pacing Pulse Width: 0.5 ms
Lead Channel Setting Pacing Pulse Width: 0.5 ms
Lead Channel Setting Sensing Sensitivity: 2 mV
Pulse Gen Model: 3562
Pulse Gen Serial Number: 8134139

## 2024-04-06 ENCOUNTER — Ambulatory Visit: Payer: Self-pay | Admitting: Internal Medicine

## 2024-04-07 ENCOUNTER — Telehealth: Payer: Self-pay

## 2024-04-07 ENCOUNTER — Other Ambulatory Visit: Payer: Self-pay | Admitting: Family Medicine

## 2024-04-07 ENCOUNTER — Other Ambulatory Visit: Payer: Self-pay

## 2024-04-07 DIAGNOSIS — Z7901 Long term (current) use of anticoagulants: Secondary | ICD-10-CM

## 2024-04-07 NOTE — Telephone Encounter (Signed)
 Copied from CRM #[id number]. Topic: Appointments - Scheduling Inquiry for Clinic >> Apr 07, 2024 10:44 AM Noah Thompson wrote: Reason for CRM: pt calling to schedule a shingles shot. Pt callback  # is [phone]  Pt availability is afternoons 2:30-3 they are open.   Will need to be scheduled with the pharmacy clinic

## 2024-04-07 NOTE — Telephone Encounter (Signed)
 Called and scheduled patient on pahrmacy immunization clinic schedule for 11/18 @ 2:30pm.

## 2024-04-07 NOTE — Progress Notes (Signed)
 Remote PPM Transmission

## 2024-04-08 ENCOUNTER — Other Ambulatory Visit: Payer: Self-pay

## 2024-04-08 NOTE — Telephone Encounter (Signed)
 Pt is compliant with warfarin management and PCP apts.  Sent in refill of warfarin to requested pharmacy.

## 2024-04-10 ENCOUNTER — Ambulatory Visit (INDEPENDENT_AMBULATORY_CARE_PROVIDER_SITE_OTHER)

## 2024-04-10 DIAGNOSIS — Z7901 Long term (current) use of anticoagulants: Secondary | ICD-10-CM | POA: Diagnosis not present

## 2024-04-10 LAB — POCT INR: INR: 3.1 — AB (ref 2.0–3.0)

## 2024-04-10 NOTE — Progress Notes (Signed)
 Continue 1/2 tablet daily except take 1 tablet on Mondays, Wednesday and Friday. Recheck in 4 weeks.

## 2024-04-10 NOTE — Patient Instructions (Addendum)
 Pre visit review using our clinic review tool, if applicable. No additional management support is needed unless otherwise documented below in the visit note.  Continue 1/2 tablet daily except take 1 tablet on Mondays, Wednesday and Friday. Recheck in 4 weeks.

## 2024-04-10 NOTE — Progress Notes (Signed)
 Remote PPM Transmission

## 2024-04-24 ENCOUNTER — Telehealth: Payer: Self-pay | Admitting: Internal Medicine

## 2024-04-24 DIAGNOSIS — Z79899 Other long term (current) drug therapy: Secondary | ICD-10-CM

## 2024-04-24 DIAGNOSIS — I428 Other cardiomyopathies: Secondary | ICD-10-CM

## 2024-04-24 NOTE — Telephone Encounter (Signed)
 Left message to call back.

## 2024-04-24 NOTE — Telephone Encounter (Signed)
 Pt c/o medication issue:  1. Name of Medication: sacubitril -valsartan  (ENTRESTO ) 97-103 MG   2. How are you currently taking this medication (dosage and times per day)? Take 1 tablet by mouth 2 (two) times daily.   3. Are you having a reaction (difficulty breathing--STAT)? No   4. What is your medication issue? Pt asking about financial resources for Entresto . Please advise.    [phone]

## 2024-04-25 ENCOUNTER — Other Ambulatory Visit (HOSPITAL_COMMUNITY): Payer: Self-pay

## 2024-04-25 NOTE — Telephone Encounter (Signed)
 [name] returning nurses call from yesterday. Call transferred

## 2024-04-25 NOTE — Telephone Encounter (Signed)
 Spoke to patient  wife. She states patient does not have Medicare part- d ( medication coverage)    Rn informed patient wife that [employer] is no longer assisting with  patient assistance with Entresto  97/103 mg . The medication has gone generic.   Not sure if any assistance is available. RN informed Ms Visser the  enrollment to Medicare supplements are going on mow. It maybe favorable to contact  an agent to see if coverage is feasible. She voiced understanding. Also she can call several pharmacy and see how much it will cost out of pocket.    [facility] - for the generic without ins a 30 day is $101.51 and a 90 day is 294.51    If patient uses good Rx coupon - 30 days - 78.66  and 9 day is 198.90   RN will send to provider for review if patient does not like these options and can not get any assistance   Wife verbalized understanding  and states patient has about 2 months worth of the medication before he runs out.

## 2024-04-29 ENCOUNTER — Other Ambulatory Visit: Payer: Self-pay | Admitting: Family Medicine

## 2024-05-01 NOTE — Telephone Encounter (Signed)
 Sent. Thanks.

## 2024-05-08 ENCOUNTER — Ambulatory Visit (INDEPENDENT_AMBULATORY_CARE_PROVIDER_SITE_OTHER)

## 2024-05-08 DIAGNOSIS — Z7901 Long term (current) use of anticoagulants: Secondary | ICD-10-CM

## 2024-05-08 LAB — POCT INR: INR: 2.4 (ref 2.0–3.0)

## 2024-05-08 NOTE — Progress Notes (Addendum)
 Indication: MAVR Pt has been eating more salads than normal. Increase dose today to take 1 tablet and then continue 1/2 tablet daily except take 1 tablet on Mondays, Wednesday and Friday. Recheck in 4 weeks per pt request due to holiday.

## 2024-05-08 NOTE — Patient Instructions (Addendum)
 Pre visit review using our clinic review tool, if applicable. No additional management support is needed unless otherwise documented below in the visit note.  Increase dose today to take 1 tablet and then continue 1/2 tablet daily except take 1 tablet on Mondays, Wednesday and Friday. Recheck in 4 weeks.

## 2024-05-09 NOTE — Telephone Encounter (Signed)
 Spoke to patient and wife. RN gave them the  cost of the generic Entresto    Wife and patient state that was still to much for them to purchase.  They would like to know what options do they have?   RN informed both that message will be sent to Dr Emilee for a response   Verbalized understnding

## 2024-05-19 ENCOUNTER — Other Ambulatory Visit: Payer: Self-pay

## 2024-05-19 MED ORDER — SHINGRIX 50 MCG/0.5ML IM SUSR
INTRAMUSCULAR | 1 refills | Status: AC
  Filled 2024-05-20: qty 1, 1d supply, fill #0

## 2024-05-20 ENCOUNTER — Other Ambulatory Visit: Payer: Self-pay

## 2024-05-20 MED ORDER — VALSARTAN 160 MG PO TABS: 160.0000 mg | ORAL_TABLET | Freq: Every day | ORAL | 3 refills | Status: DC

## 2024-05-20 NOTE — Telephone Encounter (Signed)
 Per Dr Primus The option would be valsartan - though it does not work quite as well it should be much more affordable    Verbal order from Dr Primus fisherman  start Valsartan  160 mg , once patient complete taking Entresto  97/103 mg.

## 2024-05-20 NOTE — Telephone Encounter (Signed)
 RN called and spoke to wife   RN informed wife of Dr Santo  recommendation to changing medication .  She is aware  that pshe or patient needs to call back when he has the last 2 weeks of Entresto  .  A new Rx of Valsartan  160 mg will be sent to [facility] to switch medication  due to cost factor of Entresto .   Wife states Medicare representative.- told her that patient can not in enroll in  part D plan because he did not enroll when he initially stared Medicare. He will be penalized  every year.    Wife verbalized understanding .  Medication has been on the list  and BMP ( future order) been placed.

## 2024-05-21 ENCOUNTER — Other Ambulatory Visit: Payer: Self-pay

## 2024-05-22 ENCOUNTER — Other Ambulatory Visit: Payer: Self-pay

## 2024-05-27 ENCOUNTER — Other Ambulatory Visit (INDEPENDENT_AMBULATORY_CARE_PROVIDER_SITE_OTHER)

## 2024-05-27 DIAGNOSIS — Z79899 Other long term (current) drug therapy: Secondary | ICD-10-CM | POA: Diagnosis not present

## 2024-05-27 DIAGNOSIS — N289 Disorder of kidney and ureter, unspecified: Secondary | ICD-10-CM | POA: Diagnosis not present

## 2024-05-27 DIAGNOSIS — D721 Eosinophilia, unspecified: Secondary | ICD-10-CM

## 2024-05-27 DIAGNOSIS — I428 Other cardiomyopathies: Secondary | ICD-10-CM

## 2024-05-27 NOTE — Addendum Note (Signed)
 Addended by: HOPE VEVA PARAS on: 05/27/2024 02:44 PM   Modules accepted: Orders

## 2024-05-27 NOTE — Addendum Note (Signed)
 Addended by: HOPE VEVA PARAS on: 05/27/2024 02:43 PM   Modules accepted: Orders

## 2024-05-28 LAB — CBC WITH DIFFERENTIAL/PLATELET
Basophils Absolute: 0 K/uL (ref 0.0–0.1)
Basophils Relative: 0.5 % (ref 0.0–3.0)
Eosinophils Absolute: 0.7 K/uL (ref 0.0–0.7)
Eosinophils Relative: 9.9 % — ABNORMAL HIGH (ref 0.0–5.0)
HCT: 38.3 % — ABNORMAL LOW (ref 39.0–52.0)
Hemoglobin: 12.9 g/dL — ABNORMAL LOW (ref 13.0–17.0)
Lymphocytes Relative: 24 % (ref 12.0–46.0)
Lymphs Abs: 1.8 K/uL (ref 0.7–4.0)
MCHC: 33.7 g/dL (ref 30.0–36.0)
MCV: 87.5 fl (ref 78.0–100.0)
Monocytes Absolute: 0.6 K/uL (ref 0.1–1.0)
Monocytes Relative: 8 % (ref 3.0–12.0)
Neutro Abs: 4.2 K/uL (ref 1.4–7.7)
Neutrophils Relative %: 57.6 % (ref 43.0–77.0)
Platelets: 241 K/uL (ref 150.0–400.0)
RBC: 4.38 Mil/uL (ref 4.22–5.81)
RDW: 14.2 % (ref 11.5–15.5)
WBC: 7.4 K/uL (ref 4.0–10.5)

## 2024-05-28 LAB — RENAL FUNCTION PANEL
Albumin: 4.2 g/dL (ref 3.5–5.2)
BUN: 18 mg/dL (ref 6–23)
CO2: 28 meq/L (ref 19–32)
Calcium: 9.1 mg/dL (ref 8.4–10.5)
Chloride: 106 meq/L (ref 96–112)
Creatinine, Ser: 1.34 mg/dL (ref 0.40–1.50)
GFR: 49.02 mL/min — ABNORMAL LOW (ref >60.00)
Glucose, Bld: 85 mg/dL (ref 70–99)
Phosphorus: 2.6 mg/dL (ref 2.3–4.6)
Potassium: 4.1 meq/L (ref 3.5–5.1)
Sodium: 139 meq/L (ref 135–145)

## 2024-05-28 LAB — VITAMIN D 25 HYDROXY (VIT D DEFICIENCY, FRACTURES): VITD: 24.48 ng/mL — ABNORMAL LOW (ref 30.00–100.00)

## 2024-05-30 ENCOUNTER — Other Ambulatory Visit: Payer: Self-pay

## 2024-06-02 ENCOUNTER — Ambulatory Visit: Payer: Self-pay | Admitting: Family Medicine

## 2024-06-03 ENCOUNTER — Other Ambulatory Visit: Payer: Self-pay | Admitting: Family Medicine

## 2024-06-03 ENCOUNTER — Ambulatory Visit: Payer: Self-pay

## 2024-06-03 MED ORDER — PREDNISONE 20 MG PO TABS: ORAL_TABLET | ORAL | 0 refills | Status: DC

## 2024-06-03 NOTE — Telephone Encounter (Signed)
 FYI Only or Action Required?: Action required by provider: update on patient condition and requesting prescription.  Patient was last seen in primary care on 02/26/2024 by Rilla Baller, MD.  Called Nurse Triage reporting Foot Swelling and Toe Pain.  Symptoms began several days ago.  Interventions attempted: OTC medications: Tylenol  and Dietary changes. Patient states he has recently been eating red meat, fast foot and shellfish that he thinks attributed to the flare up.  Symptoms are: gradually worsening.  Triage Disposition: See Physician Within 24 Hours- Declined RN offer to make appointment for visit  Patient/caregiver understands and will follow disposition?: No, wishes to speak with PCP         Copied from CRM #[id number]. Topic: Clinical - Red Word Triage >> Jun 03, 2024 11:40 AM Eva FALCON wrote: Red Word that prompted transfer to Nurse Triage: Gout on left big toe, swollen, started last Friday. Reason for Disposition  [1] Swollen toe AND [2] no fever  (Exceptions: Just a localized bump from bunion, corns, insect bite, sting.)  Answer Assessment - Initial Assessment Questions 1. ONSET: When did the pain start?      Friday.  2. LOCATION: Where is the pain located?   (e.g., around nail, entire toe, at foot joint)      Left great toe.  3. PAIN: How bad is the pain?    (Scale 1-10; or mild, moderate, severe)     Moderate, tolerable pain during the day when walking on it, worse and severe 8/10 at night. Patient tried drinking cherry juice and taking Tylenol  for pain.  4. APPEARANCE: What does the toe look like? (e.g., redness, swelling, bruising, pallor)     Swelling to great toe and 2nd and 3rd toes. Slight redness and warmth.  5. CAUSE: What do you think is causing the toe pain?     Gout.  6. OTHER SYMPTOMS: Do you have any other symptoms? (e.g., leg pain, rash, fever, numbness)     Denies fever, rash, cold or blue foot, numbness or tingling, red streaks  coming up leg.  Protocols used: Toe Pain-A-AH

## 2024-06-03 NOTE — Progress Notes (Signed)
 ERx prednisone  taper

## 2024-06-03 NOTE — Telephone Encounter (Signed)
 Left message to return call to our office.

## 2024-06-03 NOTE — Telephone Encounter (Signed)
 I have sent in prednisone  course for him to treat presumed gout flare. To [facility] pharmacy He will need OV if not better with this or new or worsening symptoms.

## 2024-06-04 NOTE — Telephone Encounter (Unsigned)
 Copied from CRM #[id number]. Topic: Clinical - Lab/Test Results >> Jun 03, 2024  4:07 PM Noah Thompson wrote: Reason for CRM: patient returning a call in regards to lab results. I read the message to the patient but some words I needed clarity on and the proper  wording for the patient so I called [facility]. They told me the nurse was with a patient.  Patient would also like to be called regarding the prednisone  for his gout   [phone]

## 2024-06-04 NOTE — Telephone Encounter (Signed)
 Left message to return call to our office.

## 2024-06-04 NOTE — Telephone Encounter (Unsigned)
 Copied from CRM [id number]. Topic: Clinical - Lab/Test Results >> Jun 04, 2024  1:49 PM Viola F wrote: Reason for CRM: Patient returned Joellens call regarding lab results

## 2024-06-05 ENCOUNTER — Ambulatory Visit

## 2024-06-05 DIAGNOSIS — Z7901 Long term (current) use of anticoagulants: Secondary | ICD-10-CM | POA: Diagnosis not present

## 2024-06-05 LAB — POCT INR: INR: 3 (ref 2.0–3.0)

## 2024-06-05 NOTE — Telephone Encounter (Signed)
 Pt was in coumadin  clinic today and requested latest lab results. Advised pt of results and PCP msg. Printed results for pt.   Pt denies any blood in urine or stool. He has already started vitamin D  supplement.   Advised if any s/s of bleeding to contact office or go to ER. Pt verbalized understanding.

## 2024-06-05 NOTE — Telephone Encounter (Signed)
 Left message to return call to our office.

## 2024-06-05 NOTE — Patient Instructions (Addendum)
 Pre visit review using our clinic review tool, if applicable. No additional management support is needed unless otherwise documented below in the visit note.  Continue 1/2 tablet daily except take 1 tablet on Mondays, Wednesday and Friday. Recheck in 2 weeks.

## 2024-06-05 NOTE — Progress Notes (Signed)
 Indication: MAVR Pt reports gout flare up and was prescribed prednisone  he started yesterday. He was advised also by PCP that cherry juice can help and he reports he is also eating a lot of tangerines for vitamin C. He requested his latest lab results and PCP msg. Printed these and educated pt on results and provided PCP note.  Sent msg to PCP concerning results.  Continue 1/2 tablet daily except take 1 tablet on Mondays, Wednesday and Friday. Recheck in 2 weeks.

## 2024-06-17 ENCOUNTER — Telehealth: Payer: Self-pay | Admitting: Internal Medicine

## 2024-06-17 ENCOUNTER — Telehealth: Payer: Self-pay | Admitting: Pharmacy Technician

## 2024-06-17 ENCOUNTER — Other Ambulatory Visit (HOSPITAL_COMMUNITY): Payer: Self-pay

## 2024-06-17 MED ORDER — SACUBITRIL-VALSARTAN 97-103 MG PO TABS: 1.0000 | ORAL_TABLET | Freq: Two times a day (BID) | ORAL | 3 refills | Status: DC

## 2024-06-17 NOTE — Telephone Encounter (Signed)
 Noah Thompson

## 2024-06-17 NOTE — Telephone Encounter (Signed)
 Refill sent

## 2024-06-17 NOTE — Telephone Encounter (Signed)
° °  Hi, they actually just need a prescription sent to the [employer] [facility] of: [facility]. Please and thank you   Sent message back to staff

## 2024-06-17 NOTE — Telephone Encounter (Signed)
 Returned call to patient's wife left message on personal voice mail Entresto  refill sent in.

## 2024-06-17 NOTE — Telephone Encounter (Signed)
 Pt c/o medication issue:  1. Name of Medication:  sacubitril -valsartan  (ENTRESTO ) 97-103 MG  2. How are you currently taking this medication (dosage and times per day)?   3. Are you having a reaction (difficulty breathing--STAT)?   4. What is your medication issue?   See 10/23 encounter. Wife says [employer] needs an authorization prior to distributing final supply of Entresto  to patient. Please advise.

## 2024-06-17 NOTE — Telephone Encounter (Signed)
 Spoke to patient's wife she stated she called [employer] at [phone].Stated they told her we needed to call them and they would help with Entresto  until the end of December.Advised I will send message to our RX team.

## 2024-06-19 ENCOUNTER — Ambulatory Visit

## 2024-06-19 DIAGNOSIS — Z7901 Long term (current) use of anticoagulants: Secondary | ICD-10-CM | POA: Diagnosis not present

## 2024-06-19 LAB — POCT INR: INR: 2.9 (ref 2.0–3.0)

## 2024-06-19 NOTE — Patient Instructions (Addendum)
 Pre visit review using our clinic review tool, if applicable. No additional management support is needed unless otherwise documented below in the visit note.  Continue 1/2 tablet daily except take 1 tablet on Mondays, Wednesday and Friday. Recheck in 4 weeks.

## 2024-06-19 NOTE — Progress Notes (Signed)
 Indication: MAVR Continue 1/2 tablet daily except take 1 tablet on Mondays, Wednesday and Friday. Recheck in 4 weeks.

## 2024-06-20 ENCOUNTER — Ambulatory Visit: Admitting: Pulmonary Disease

## 2024-06-20 NOTE — Progress Notes (Deleted)
" °  Electrophysiology Office Note:   Date:  06/20/2024  ID:  FREAD KOTTKE, DOB [birth year], MRN [id number]  Primary Cardiologist: Stanly DELENA Leavens, MD Primary Heart Failure: None Electrophysiologist: Danelle Birmingham, MD   {Click to update primary MD,subspecialty MD or APP then REFRESH:1}    History of Present Illness:   Noah Thompson is a 83 y.o. 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   Since last being seen in our clinic the patient reports doing ***.    He *** denies chest pain, palpitations, dyspnea, PND, orthopnea, nausea, vomiting, dizziness, syncope, edema, weight gain, or early satiety.   Review of systems complete and found to be negative unless listed in HPI.    EP Information / Studies Reviewed:    EKG is not ordered today. EKG from 12/18/23 reviewed which showed AV dual paced rhythm      PPM Interrogation-  reviewed in detail today,  See PACEART report.  Device History: Abbott Dual Chamber PPM implanted 08/26/1991 for CHB > upgrade to CRT-P 07/06/22 Generator Change 01/25/12    Risk Assessment/Calculations:     No BP recorded.  {Refresh Note OR Click here to enter BP  :1}***        Physical Exam:   VS:  There were no vitals taken for this visit.   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

## 2024-07-02 ENCOUNTER — Ambulatory Visit: Payer: Self-pay

## 2024-07-02 NOTE — Telephone Encounter (Signed)
 FYI Only or Action Required?: FYI only for provider: Going to UC, ED precautions reviewed.  Patient was last seen in primary care on 02/26/2024 by Rilla Baller, MD.  Called Nurse Triage reporting Cough.  Symptoms began several weeks ago.  Interventions attempted: OTC medications: mucinex .  Symptoms are: unchanged.  Triage Disposition: See Physician Within 24 Hours  Patient/caregiver understands and will follow disposition?: Yes Reason for Disposition  SEVERE coughing spells (e.g., whooping sound after coughing, vomiting after coughing)  Answer Assessment - Initial Assessment Questions Patient states he has had a cough x 3 weeks with yellow to clear sputum. He states it feels somewhat improved since an episode of coughing that caused him to vomit. Denies SOB above his baseline. Also denies wheezing, abdominal pain, chest pain, back pain or fever.   This RN explained rationale for being seen today d/t patient's extensive cardiac history. Advised that they can go to UC or the ED. Advised that if he develops any SOB, wheezing, abdominal pain, chest pain or fever to go to the ED via EMS instead. Agreeable to going to UC at this time. Provided closest UC location and hours. Advised UC may refer to ED if there are any concerning findings.  1. ONSET: When did the cough begin?      3 weeks 2. SEVERITY: How bad is the cough today?      Caused him to vomit once 3. SPUTUM: Describe the color of your sputum (e.g., none, dry cough; clear, white, yellow, green)     Yellow to clear 4. HEMOPTYSIS: Are you coughing up any blood? If Yes, ask: How much? (e.g., flecks, streaks, tablespoons, etc.)     Denies 5. DIFFICULTY BREATHING: Are you having difficulty breathing? If Yes, ask: How bad is it? (e.g., mild, moderate, severe)      Denies 6. FEVER: Do you have a fever? If Yes, ask: What is your temperature, how was it measured, and when did it start?     Denies 7. CARDIAC HISTORY:  Do you have any history of heart disease? (e.g., heart attack, congestive heart failure)      CHF, aortic valve replacement, HTN, pacemaker, aneurism  8. LUNG HISTORY: Do you have any history of lung disease?  (e.g., pulmonary embolus, asthma, emphysema)     Denies  Protocols used: Cough - Acute Productive-A-AH Copied from CRM #[id number]. Topic: Clinical - Red Word Triage >> Jul 02, 2024  3:14 PM Noah Thompson wrote: Red Word that prompted transfer to Nurse Triage: Patient spouse called said he's been coughing for about 3 weeks and last night he coughed so hard he threw up. Has a heart problem and wanted to know what he can take. Past Medical History:  Diagnosis Date   CAP (community acquired pneumonia) 07/25/2017   Cataract    left eye   CHF (congestive heart failure) (HCC)    Complete heart block (HCC)    a. 1993 s/p PPM 2/2 perioperative heart block;  b. 01/2012 s/p SJM Accent DC PPM upgrade.   Depression    GERD (gastroesophageal reflux disease)    History of nephritis    a. as child.   History of pneumonia    Hyperlipidemia    Hypertension    NICM (nonischemic cardiomyopathy) (HCC)    a. 06/2012 Echo: EF 45-50%;  b. 08/2013 Echo: EF 20-25%;  c. 10/2013 Adenosine  MV: Fixed septal defect likely representing LBBB-related artifact, no reversible ischemia, EF 43%, low risk.   Paroxysmal atrial tachycardia  a. 07/2013 noted on device interrogation.   Presence of permanent cardiac pacemaker 2013   RA (rheumatoid arthritis) (HCC)    S/P aortic valve replacement    a. 1993 s/p SJM mechanical AVR for aortic stenosis - chronic coumadin .   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

## 2024-07-04 ENCOUNTER — Ambulatory Visit: Payer: Medicare Other

## 2024-07-04 DIAGNOSIS — R001 Bradycardia, unspecified: Secondary | ICD-10-CM

## 2024-07-04 NOTE — Telephone Encounter (Signed)
 Agree with UCC recommendation.

## 2024-07-05 LAB — CUP PACEART REMOTE DEVICE CHECK
Battery Remaining Longevity: 46 mo
Battery Remaining Percentage: 72 %
Battery Voltage: 2.98 V
Brady Statistic AP VP Percent: 76 %
Brady Statistic AP VS Percent: 1 %
Brady Statistic AS VP Percent: 17 %
Brady Statistic AS VS Percent: 1 %
Brady Statistic RA Percent Paced: 73 %
Date Time Interrogation Session: 20260102020010
Implantable Lead Connection Status: 753985
Implantable Lead Connection Status: 753985
Implantable Lead Connection Status: 753985
Implantable Lead Implant Date: 19930223
Implantable Lead Implant Date: 19930223
Implantable Lead Implant Date: 20240104
Implantable Lead Location: 753858
Implantable Lead Location: 753859
Implantable Lead Location: 753860
Implantable Pulse Generator Implant Date: 20240104
Lead Channel Impedance Value: 460 Ohm
Lead Channel Impedance Value: 460 Ohm
Lead Channel Impedance Value: 540 Ohm
Lead Channel Pacing Threshold Amplitude: 0.75 V
Lead Channel Pacing Threshold Amplitude: 1 V
Lead Channel Pacing Threshold Amplitude: 1.5 V
Lead Channel Pacing Threshold Pulse Width: 0.5 ms
Lead Channel Pacing Threshold Pulse Width: 0.5 ms
Lead Channel Pacing Threshold Pulse Width: 0.7 ms
Lead Channel Sensing Intrinsic Amplitude: 12 mV
Lead Channel Sensing Intrinsic Amplitude: 3.8 mV
Lead Channel Setting Pacing Amplitude: 2 V
Lead Channel Setting Pacing Amplitude: 2.5 V
Lead Channel Setting Pacing Amplitude: 2.75 V
Lead Channel Setting Pacing Pulse Width: 0.5 ms
Lead Channel Setting Pacing Pulse Width: 0.5 ms
Lead Channel Setting Sensing Sensitivity: 2 mV
Pulse Gen Model: 3562
Pulse Gen Serial Number: 8134139

## 2024-07-06 ENCOUNTER — Ambulatory Visit: Payer: Self-pay | Admitting: Cardiology

## 2024-07-08 ENCOUNTER — Ambulatory Visit: Admitting: Family Medicine

## 2024-07-08 NOTE — Progress Notes (Signed)
 Remote PPM Transmission

## 2024-07-10 ENCOUNTER — Other Ambulatory Visit: Payer: Self-pay | Admitting: Pulmonary Disease

## 2024-07-14 NOTE — Telephone Encounter (Signed)
 Pt is calling following up on this request, pt only has 3 pill left.  He has an appt for 2/9  Pharmacy is [facility]

## 2024-07-17 ENCOUNTER — Ambulatory Visit

## 2024-07-17 DIAGNOSIS — Z7901 Long term (current) use of anticoagulants: Secondary | ICD-10-CM | POA: Diagnosis not present

## 2024-07-17 LAB — POCT INR: INR: 2.3 (ref 2.0–3.0)

## 2024-07-17 NOTE — Progress Notes (Addendum)
 Indication: MAVR Pt reported severe cough to the clinic on 12/31. Pt was advised to go to UC at that time. Pt reports today he did not go. He is feeling much better and no longer has a cough. Pt reports eating a lot of salads over the last 2 weeks. Educated to keep these foods consistent in diet. Pt verbalized understanding.  Increase dose today to take 1 tablet  and then continue 1/2 tablet daily except take 1 tablet on Mondays, Wednesday and Friday. Recheck in 2 weeks.

## 2024-07-17 NOTE — Patient Instructions (Addendum)
 Pre visit review using our clinic review tool, if applicable. No additional management support is needed unless otherwise documented below in the visit note.  Increase dose today to take 1 tablet and then continue 1/2 tablet daily except take 1 tablet on Mondays, Wednesday and Friday. Recheck in 2 weeks.

## 2024-07-18 ENCOUNTER — Ambulatory Visit: Attending: Internal Medicine | Admitting: Internal Medicine

## 2024-07-18 VITALS — BP 122/74 | HR 63 | Ht 71.0 in | Wt 183.0 lb

## 2024-07-18 DIAGNOSIS — I441 Atrioventricular block, second degree: Secondary | ICD-10-CM | POA: Diagnosis not present

## 2024-07-18 DIAGNOSIS — I5042 Chronic combined systolic (congestive) and diastolic (congestive) heart failure: Secondary | ICD-10-CM

## 2024-07-18 DIAGNOSIS — I7121 Aneurysm of the ascending aorta, without rupture: Secondary | ICD-10-CM

## 2024-07-18 NOTE — Progress Notes (Signed)
 " Cardiology Office Note:  .    Date:  07/18/2024  ID:  [name], DOB 1940/11/20, MRN [id number] PCP: Noah Baller, MD  [facility] Providers Cardiologist:  Stanly DELENA Leavens, MD Electrophysiologist:  Danelle Birmingham, MD     CC: Follow up Patient prosthetic mismatch and aortopathy  History of Present Illness: .    Noah Thompson is a 84 y.o. male  with mechanical mitral valve replacement and thoracic aortic aneurysm who presents for follow-up of his cardiac conditions. He is accompanied by his wife.  He has a history of mechanical mitral valve replacement performed in 1993 and has been experiencing prosthetic valve dysfunction. He also has a Information Systems Manager and a known left bundle branch block. Despite these findings, he remains asymptomatic and prefers not to undergo further interventions.  He has a moderate thoracic aortic aneurysm, which has been monitored over the years. He recalls being informed that the aneurysm has been checked for nearly ten years, and he has undergone imaging studies, including a transesophageal echocardiogram and CT scans in 2022 and 2023. He prefers not to pursue further imaging.  He experiences hypotension and occasional lightheadedness, particularly after taking Entresto , which he finds lowers his blood pressure. He manages these episodes by sitting down and sometimes consuming sugar, which he believes helps alleviate the symptoms. These episodes do not occur frequently. His current medications include Entresto , which he continues to take. He is considering obtaining his medications through the [facility] for cost reasons.  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 He adheres to advice not to lift heavy objects. He reports no trouble with leg swelling.  Discussed the use of AI scribe software for clinical note transcription with the patient, who gave verbal consent to  proceed.   Relevant histories: .  Social  - comes with wife ROS: As per HPI.   Studies Reviewed: .     Cardiac Studies & Procedures   ______________________________________________________________________________________________     ECHOCARDIOGRAM  ECHOCARDIOGRAM COMPLETE 04/19/2021  Narrative ECHOCARDIOGRAM REPORT    Patient Name:   Noah Thompson Date of Exam: 04/19/2021 Medical Rec #:  [id number]       Height:       71.5 in Accession #:    [id number]      Weight:       180.8 lb Date of Birth:  1940/09/04       BSA:          2.031 m Patient Age:    84 years        BP:           142/80 mmHg Patient Gender: M               HR:           53 bpm. 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 Pacemaker, Non ischemic cardiomyopathy, Signs/Symptoms:AVR (mechanical); Risk Factors:Hypertension and Dyslipidemia.  Sonographer:    Elsie Bohr RDCS Referring Phys: [id number] RENEE LYNN URSUY   Sonographer Comments: Technically difficult study due to poor echo windows. Image acquisition challenging due to respiratory motion. IMPRESSIONS   1. There is a mechanical aortic valve prosthesis present (unclear surgical details). 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 Vmax 3.9 m/s, MG, 33 mmHG, EOA 0.63 cm2, DI 0.16. The AT is prolonged ~136msec. There is trivial AI which is likely washing jets (normal finding). All other findings are suggestive of severe prosthetic valve stenosis. Recent INRs are within limits. Would recommend a cardiac CT or TEE for clarification of valve dysfunction. The aortic valve has been repaired/replaced. Aortic valve regurgitation is trivial. Severe aortic valve stenosis. 2. Left ventricular ejection fraction, by estimation, is 30 to 35%. The left ventricle has  moderately decreased function. The left ventricle demonstrates global hypokinesis. Left ventricular diastolic parameters are consistent with Grade II diastolic dysfunction (pseudonormalization). Elevated left ventricular end-diastolic pressure. The E/e' is 25.3. 3. Right ventricular systolic function is mildly reduced. The right ventricular size is moderately enlarged. There is normal pulmonary artery systolic pressure. 4. The mitral valve is grossly normal. Moderate to severe mitral valve regurgitation. No evidence of mitral stenosis. 5. Aneurysm of the ascending aorta, measuring 47 mm. 6. The inferior vena cava is normal in size with greater than 50% respiratory variability, suggesting right atrial pressure of 3 mmHg.  Comparison(s): Changes from prior study are noted. EF 30-35%. Concerns for severe prosthetic valve stenosis.  FINDINGS Left Ventricle: Left ventricular ejection fraction, by estimation, is 30 to 35%. The left ventricle has moderately decreased function. The left ventricle demonstrates global hypokinesis. The left ventricular internal cavity size was normal in size. There is no left ventricular hypertrophy. Left ventricular diastolic parameters are consistent with Grade II diastolic dysfunction (pseudonormalization). Elevated left ventricular end-diastolic pressure. The E/e' is 25.3.  Right Ventricle: The right ventricular size is moderately enlarged. No increase in right ventricular wall thickness. Right ventricular systolic function is mildly reduced. There is normal pulmonary artery systolic pressure. The tricuspid regurgitant velocity is 2.47 m/s, and with an assumed right atrial pressure of 3 mmHg, the estimated right ventricular systolic pressure is 27.4 mmHg.  Left Atrium: Left atrial size was normal in size.  Right Atrium: Right atrial size was normal in size.  Pericardium: There is no evidence of pericardial effusion.  Mitral Valve: The mitral valve is grossly normal.  Moderate to severe mitral valve regurgitation. No evidence of mitral valve stenosis.  Tricuspid Valve: The tricuspid valve is grossly normal. Tricuspid valve regurgitation is trivial. No evidence of tricuspid stenosis.  Aortic Valve: There is a mechanical aortic valve prosthesis present (unclear surgical details). 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 Vmax 3.9 m/s, MG, 33 mmHG, EOA 0.63 cm2, DI 0.16. The AT is prolonged ~190msec. There is trivial AI which is likely washing jets (normal finding). All other findings are suggestive of severe prosthetic valve stenosis. Recent INRs are within limits. Would recommend a cardiac CT or TEE for clarification of valve dysfunction. The aortic valve has been repaired/replaced. Aortic valve regurgitation is trivial. Severe aortic stenosis is present. Aortic valve mean gradient measures 33.0 mmHg. Aortic valve peak gradient measures 60.8 mmHg. Aortic valve area, by VTI measures 0.63 cm. There is a mechanical valve present in the aortic position.  Pulmonic Valve: The pulmonic valve was grossly normal. Pulmonic valve regurgitation is trivial. No evidence of pulmonic stenosis.  Aorta: The aortic root is normal in size and structure. There is an aneurysm involving the ascending aorta measuring 47 mm.  Venous: The inferior vena cava is normal in size with greater than 50% respiratory variability, suggesting right atrial pressure of 3 mmHg.  IAS/Shunts: The atrial septum is grossly normal.  Additional Comments: A device lead is visualized in the right  atrium and right ventricle. There is a small pleural effusion in the left lateral region.   LEFT VENTRICLE PLAX 2D LVIDd:         5.50 cm   Diastology LVIDs:         4.50 cm   LV e' medial:    4.82 cm/s LV PW:         0.90 cm   LV E/e' medial:  24.9 LV IVS:        1.10 cm   LV e' lateral:   6.27 cm/s LVOT diam:     2.20 cm   LV E/e' lateral: 19.1 LV SV:         57 LV SV Index:   28 LVOT Area:     3.80  cm   RIGHT VENTRICLE             IVC RV S prime:     12.20 cm/s  IVC diam: 1.40 cm TAPSE (M-mode): 1.7 cm RVSP:           27.4 mmHg  LEFT ATRIUM             Index        RIGHT ATRIUM           Index LA diam:        3.30 cm 1.63 cm/m   RA Pressure: 3.00 mmHg LA Vol (A2C):   67.1 ml 33.04 ml/m  RA Area:     16.50 cm LA Vol (A4C):   50.3 ml 24.77 ml/m  RA Volume:   44.70 ml  22.01 ml/m LA Biplane Vol: 61.5 ml 30.29 ml/m AORTIC VALVE AV Area (Vmax):    0.61 cm AV Area (Vmean):   0.68 cm AV Area (VTI):     0.63 cm AV Vmax:           390.00 cm/s AV Vmean:          252.600 cm/s AV VTI:            0.911 m AV Peak Grad:      60.8 mmHg AV Mean Grad:      33.0 mmHg LVOT Vmax:         62.50 cm/s LVOT Vmean:        45.400 cm/s LVOT VTI:          0.150 m LVOT/AV VTI ratio: 0.16  AORTA Ao Root diam: 3.60 cm Ao Asc diam:  4.70 cm  MITRAL VALVE                TRICUSPID VALVE MV Area (PHT): 2.26 cm     TR Peak grad:   24.4 mmHg MV Decel Time: 336 msec     TR Vmax:        247.00 cm/s MV E velocity: 120.00 cm/s  Estimated RAP:  3.00 mmHg MV A velocity: 105.22 cm/s  RVSP:           27.4 mmHg MV E/A ratio:  1.14 SHUNTS Systemic VTI:  0.15 m Systemic Diam: 2.20 cm  Darryle Decent MD Electronically signed by Darryle Decent MD Signature Date/Time: 04/19/2021/8:50:13 PM    Final   TEE  ECHO TEE 08/08/2021  Narrative TRANSESOPHOGEAL ECHO REPORT    Patient Name:   AWAIS COBARRUBIAS Date of Exam: 08/08/2021 Medical Rec #:  [id number]       Height:       71.0 in Accession #:    [id number]      Weight:  170.0 lb Date of Birth:  July 09, 1940       BSA:          1.968 m Patient Age:    84 years        BP:           151/90 mmHg Patient Gender: M               HR:           58 bpm. Exam Location:  Inpatient  Procedure: Transesophageal Echo, 3D Echo, Color Doppler and Cardiac Doppler  Indications:    Mitral Regurgitation i34.0  History:        Patient has prior history of  Echocardiogram examinations, most recent 04/19/2021. Pacemaker; Risk Factors:Hypertension and Dyslipidemia.  Sonographer:    Damien Senior RDCS Referring Phys: [id number] [facility] A Cowan Pilar  PROCEDURE: After discussion of the risks and benefits of a TEE, an informed consent was obtained from the patient. The transesophogeal probe was passed without difficulty through the esophogus of the patient. Sedation performed by different physician. The patient was monitored while under deep sedation. Anesthestetic sedation was provided intravenously by Anesthesiology: 174mg  of Propofol . The patient developed no complications during the procedure.  IMPRESSIONS   1. The aortic valve has been replaced by a St. Jude Mechanical Aortic Valve. Aortic valve regurgitation is trivial. Effective orifice area, by VTI measures 0.73 cm. Aortic valve mean gradient measures 26.0 mmHg. Aortic valve Vmax measures 3.51 m/s. DVI 0.19. Acceleration time 112 ms. Severe prosthetic dysfunction noted. 2. Left ventricular ejection fraction, by estimation, is 30 to 35%. The left ventricle has moderately decreased function. The left ventricle has no regional wall motion abnormalities. 3. Right ventricular systolic function is low normal. The right ventricular size is mildly enlarged. 4. No left atrial/left atrial appendage thrombus was detected. 5. The mitral valve is grossly normal. Moderate mitral valve regurgitation. No evidence of mitral stenosis. Pulmonary vein blunting without reversal. VCA of largest MR jet 0.21 cm2. 6. Aortic dilatation noted. There is moderate dilatation of the ascending aorta, measuring 49 mm.  Conclusion(s)/Recommendation(s): Will send to heart team for multi-disciplinary discussion.  FINDINGS Left Ventricle: Left ventricular ejection fraction, by estimation, is 30 to 35%. The left ventricle has moderately decreased function. The left ventricle has no regional wall motion abnormalities. The left  ventricular internal cavity size was normal in size.  Right Ventricle: The right ventricular size is mildly enlarged. Right vetricular wall thickness was not assessed. Right ventricular systolic function is low normal.  Left Atrium: Left atrial size was normal in size. No left atrial/left atrial appendage thrombus was detected.  Right Atrium: Right atrial size was normal in size.  Pericardium: There is no evidence of pericardial effusion.  Mitral Valve: The mitral valve is grossly normal. Moderate mitral valve regurgitation. No evidence of mitral valve stenosis.  Tricuspid Valve: The tricuspid valve is normal in structure. Tricuspid valve regurgitation is mild . No evidence of tricuspid stenosis.  Aortic Valve: The aortic valve has been repaired/replaced. Aortic valve regurgitation is trivial. Aortic valve mean gradient measures 26.0 mmHg. Aortic valve peak gradient measures 49.3 mmHg. Aortic valve area, by VTI measures 0.73 cm.  Pulmonic Valve: The pulmonic valve was normal in structure. Pulmonic valve regurgitation is not visualized.  Aorta: Aortic dilatation noted and the aortic root is normal in size and structure. There is moderate dilatation of the ascending aorta, measuring 49 mm.  IAS/Shunts: No atrial level shunt detected by color flow Doppler.  Additional Comments:  A device lead is visualized.   LEFT VENTRICLE PLAX 2D LVOT diam:     2.10 cm LV SV:         59 LV SV Index:   30 LVOT Area:     3.46 cm   AORTIC VALVE AV Area (Vmax):    0.66 cm AV Area (Vmean):   0.72 cm AV Area (VTI):     0.73 cm AV Vmax:           351.00 cm/s AV Vmean:          239.000 cm/s AV VTI:            0.805 m AV Peak Grad:      49.3 mmHg AV Mean Grad:      26.0 mmHg LVOT Vmax:         66.90 cm/s LVOT Vmean:        50.000 cm/s LVOT VTI:          0.170 m LVOT/AV VTI ratio: 0.21  MR Peak grad:    101.6 mmHg MR Mean grad:    46.0 mmHg    SHUNTS MR Vmax:         504.00 cm/s  Systemic  VTI:  0.17 m MR Vmean:        307.0 cm/s   Systemic Diam: 2.10 cm MR PISA:         3.08 cm MR PISA Eff ROA: 24 mm MR PISA Radius:  0.70 cm  Stanly Leavens MD Electronically signed by Stanly Leavens MD Signature Date/Time: 08/08/2021/4:53:53 PM    Final    CT SCANS  CT CORONARY MORPH W/CTA COR W/SCORE 08/08/2021  Addendum 08/09/2021  4:01 PM ADDENDUM REPORT: 08/09/2021 15:58  CLINICAL DATA:  Aortic Valve pathology with assessment of prosthetic valve  EXAM: Cardiac TAVR CT  TECHNIQUE: The patient was scanned on a Siemens Force 192 slice scanner. A 120 kV retrospective scan was triggered in the descending thoracic aorta at 111 HU's. Gantry rotation speed was 270 msecs and collimation was .9 mm. No beta blockade or nitro were given. The 3D data set was reconstructed in 5% intervals of the R-R cycle. Systolic and diastolic phases were analyzed on a dedicated work station using MPR, MIP and VRT modes. The patient received 95 cc of contrast.  FINDINGS: Prosthetic Valve: There is a Clinical Research Associate Aortic Valve  Opening angle: 13 degrees (normal < 20 degrees) without evidence leaflet restriction.  Closing angle: 120 degrees (normal 120 degrees)  Level of coronary ostia above tilting disks: 9 mm (left), 14 mm (right)  No significant pannus within the valve.  Aortic Annulus Measurements- 20% Phase  Major annulus diameter: 18 mm  Minor annulus diameter:18 mm  Annular perimeter: 56 mm  Annular area: Area 2.41 cm2  Suspect 19 mm St. Jude Bileaflet Valve as the native valve  Aortic Root Measurements  Sinotubular Junction: 38 m  Ascending Thoracic Aorta: 49 mm  Aortic Arch: 39 mm  Descending Thoracic Aorta: 23 mm  Sinus of Valsalva Measurements:  Right coronary cusp width: 32 mm  Left coronary cusp width: 31 mm  Non coronary cusp width: 34 mm  Mean diameter: 34 mm  Non SAVR Valve Findings:  There is a dual chamber pacemaker  with leads noted into the SVC.  The atrial lead terminates in the right atrial appendage.  The ventricular lead terminates in the right ventricular apex.  No interatrial shunting.  Normal pulmonary artery size.  Normal pulmonary vein  drainage.  Patent left atrial appendage.  Coronary Calcium Score:  Left main: 1  Left anterior descending artery: 65  Left circumflex artery: 140  Right coronary artery: 0  Total: 206  Percentile: 37 th for age, sex, and race matched control.  IMPRESSION: 1. Mechanical Aortic valve. There is normal opening and closing angle of the prosthesis, with no significant pannus or evidence of pannus.  2. Patient's total coronary artery calcium score is 206, which is 37th percentile for subjects of the same age, gender, and race based populations.  3. 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 Will need 6 month follow up study of aorta.  4. Study consistent with patient prosthesis mismatch.  RECOMMENDATIONS:  Coronary artery calcium (CAC) score is a strong predictor of incident coronary heart disease (CHD) and provides predictive information beyond traditional risk factors. CAC scoring is reasonable to use in the decision to withhold, postpone, or initiate statin therapy in intermediate-risk or selected borderline-risk asymptomatic adults (age 87-75 years and LDL-C >=70 to <190 mg/dL) who do not have diabetes or established atherosclerotic cardiovascular disease (ASCVD).* In intermediate-risk (10-year ASCVD risk >=7.5% to <20%) adults or selected borderline-risk (10-year ASCVD risk >=5% to <7.5%) adults in whom a CAC score is measured for the purpose of making a treatment decision the following recommendations have been made:  If CAC = 0, it is reasonable to withhold statin therapy and reassess in 5 to 10 years, as long as higher risk conditions are absent (diabetes mellitus, family history of premature CHD in first  degree relatives (males <55 years; females <65 years), cigarette smoking, LDL >=190 mg/dL or other independent risk factors).  If CAC is 1 to 99, it is reasonable to initiate statin therapy for patients >=5 years of age.  If CAC is >=100 or >=75th percentile, it is reasonable to initiate statin therapy at any age.  Cardiology referral should be considered for patients with CAC scores >=400 or >=75th percentile.  *2018 AHA/ACC/AACVPR/AAPA/ABC/ACPM/ADA/AGS/APhA/ASPC/NLA/PCNA Guideline on the Management of Blood Cholesterol: A Report of the American College of Cardiology/American Heart Association Task Force on Clinical Practice Guidelines. J Am Coll Cardiol. 2019;73(24):3168-3209.  Jammy Stlouis   Electronically Signed By: Stanly Leavens M.D. On: 08/09/2021 15:58  Narrative EXAM: OVER-READ INTERPRETATION  CT CHEST  The following report is an over-read performed by radiologist Dr. Toribio Aye of [facility] on 08/08/2021. This over-read does not include interpretation of cardiac or coronary anatomy or pathology. The coronary calcium score/coronary CTA interpretation by the cardiologist is attached.  COMPARISON:  None.  FINDINGS: Extracardiac findings will be described separately under dictation for contemporaneously obtained CTA chest, abdomen and pelvis.  IMPRESSION: Please see separate dictation for contemporaneously obtained CTA chest, abdomen and pelvis dated 08/08/2021 for full description of relevant extracardiac findings.  Electronically Signed: By: Toribio Aye M.D. On: 08/08/2021 11:20     ______________________________________________________________________________________________       Physical Exam:    VS:  BP 122/74 (BP Location: Right Arm, Cuff Size: Normal)   Pulse 63   Ht 5' 11 (1.803 m)   Wt 183 lb (83 kg)   SpO2 96%   BMI 25.52 kg/m    Wt Readings from Last 3 Encounters:  07/18/24 183 lb (83 kg)   02/26/24 180 lb 4 oz (81.8 kg)  01/22/24 184 lb (83.5 kg)    Gen: no distress,  Ears:  Dempsey Sign Cardiac: No Rubs or Gallops, harsh systolic murmur, RRR +2 radial pulses Respiratory:  Clear to auscultation bilaterally, normal effort, normal  respiratory rate GI: Soft, nontender, non-distended  MS: No  edema;  moves all extremities Integument: Skin feels warm Neuro:  At time of evaluation, alert and oriented to person/place/time/situation  Psych: Normal affect, patient feels ok     ASSESSMENT AND PLAN: .    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 Asymptomatic with no significant changes in valve function since last evaluation in 2022-2023. Prefers to avoid further imaging and interventions. - Offered echocardiogram to assess valve function, but he declined. - continue GDMT; he is working to get [facility] service   Thoracic aortic aneurysm Moderate thoracic aortic aneurysm with previous recommendation for annual CTA to monitor size. He is asymptomatic and prefers not to undergo further imaging. Discussed risks of aneurysm growth, including potential for dissection and sudden death. He understands risks but prefers not to pursue further imaging or intervention. - Offered CTA of the chest to monitor aneurysm size, but he declined.  He declined f/u with Dr. Lucas  Left bundle branch block Chronic left bundle branch block with no acute changes or symptoms.  Presence of cardiac pacemaker Presence of a [facility] Jude pacemaker, now [employer], with no issues reported.  Hypotension Intermittent hypotension associated with Entresto  use, leading to lightheadedness. Symptoms managed by sitting down and consuming sugar or hydrating. - Continue current management with Entresto , monitor for symptoms of hypotension.  One year with me unless he changes  his mind; his wife is also aware of the risks of not testing but he has thoughtfully review this  Stanly Leavens, MD FASE [facility] Cardiologist [facility]  [address] [place], [address] [place], [place] [address] [phone]  6:18 PM  "

## 2024-07-18 NOTE — Patient Instructions (Signed)
 Medication Instructions:  Your physician has recommended you make the following change in your medication:  REMOVED: Valsartan  from your medication list  *If you need a refill on your cardiac medications before your next appointment, please call your pharmacy*  Lab Work: NONE  If you have labs (blood work) drawn today and your tests are completely normal, you will receive your results only by: MyChart Message (if you have MyChart) OR A paper copy in the mail If you have any lab test that is abnormal or we need to change your treatment, we will call you to review the results.  Testing/Procedures: NONE   Follow-Up: At [facility], you and your health needs are our priority.  As part of our continuing mission to provide you with exceptional heart care, our providers are all part of one team.  This team includes your primary Cardiologist (physician) and Advanced Practice Providers or APPs (Physician Assistants and Nurse Practitioners) who all work together to provide you with the care you need, when you need it.  Your next appointment:   1 year(s)  Provider:   Stanly DELENA Leavens, MD    We recommend signing up for the patient portal called MyChart.  Sign up information is provided on this After Visit Summary.  MyChart is used to connect with patients for Virtual Visits (Telemedicine).  Patients are able to view lab/test results, encounter notes, upcoming appointments, etc.  Non-urgent messages can be sent to your provider as well.   To learn more about what you can do with MyChart, go to [email or web address].   Other Instructions

## 2024-07-21 ENCOUNTER — Telehealth: Payer: Self-pay | Admitting: Internal Medicine

## 2024-07-21 MED ORDER — SACUBITRIL-VALSARTAN 97-103 MG PO TABS: 1.0000 | ORAL_TABLET | Freq: Two times a day (BID) | ORAL | 3 refills | Status: AC

## 2024-07-21 NOTE — Telephone Encounter (Signed)
 Spoke with spouse okay per DPR.  Spouse would like prescription for entresto  sent to [facility] supply 3 refills.   Refill sent per request.  No further concerns at this time.

## 2024-07-21 NOTE — Telephone Encounter (Signed)
Left message for spouse to call back

## 2024-07-21 NOTE — Telephone Encounter (Signed)
 Pt c/o medication issue:  1. Name of Medication: sacubitril -valsartan  (ENTRESTO ) 97-103 MG   2. How are you currently taking this medication (dosage and times per day)?  Take 1 tablet by mouth 2 (two) times daily.      3. Are you having a reaction (difficulty breathing--STAT)? No  4. What is your medication issue? Pt's spouse [place] is requesting a callback regarding this medication getting over to a company to she was advised to call. Please advise

## 2024-07-21 NOTE — Telephone Encounter (Signed)
 Pt returning call

## 2024-07-31 ENCOUNTER — Ambulatory Visit

## 2024-07-31 DIAGNOSIS — Z7901 Long term (current) use of anticoagulants: Secondary | ICD-10-CM

## 2024-07-31 LAB — POCT INR: INR: 2.4 (ref 2.0–3.0)

## 2024-07-31 NOTE — Patient Instructions (Addendum)
Pre visit review using our clinic review tool, if applicable. No additional management support is needed unless otherwise documented below in the visit note.  Increase dose today to take 1 tablet and then change weekly dose to take 1 tablet daily except take 1/2 tablet on Tuesday, Thursday and Saturday. Recheck in 2 weeks.

## 2024-07-31 NOTE — Progress Notes (Signed)
 Indication: MAVR Increase dose today to take 1 tablet  and then change weekly dose to take 1 tablet daily except take 1/2 tablet on Tuesday, Thursday and Saturday. Recheck in 2 weeks.

## 2024-08-06 ENCOUNTER — Ambulatory Visit: Admitting: Internal Medicine

## 2024-08-08 NOTE — Progress Notes (Unsigned)
 "    Cardiology Office Note Date:  08/08/2024  Patient ID:  Noah, Thompson Aug 02, 1940, MRN [id number] PCP:  Rilla Baller, MD  Electrophysiologist: Dr. Kelsie     Chief Complaint:  *** 6 mo follow up  History of Present Illness: Noah Thompson is a 84 y.o. male with history of  HTN, HLD, RA chronic CHF (systolic), NICM,  VHD s/p AVR (mechanical, 1993),  ATach CHB w/PPM  He saw [name] 12/18/23, doing well with sodium/fluid restrictions, no symptoms/concerns.  Offered to update his Echo to re-evaluate EF post CRT upgrade, the pt declined, no further testing wanted  He saw Dr. 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 1993 s/p PPM 2/2 perioperative heart block;  b. 01/2012 s/p SJM Accent DC PPM upgrade.   Depression    GERD (gastroesophageal reflux disease)    History of nephritis    a. as child.   History of pneumonia    Hyperlipidemia    Hypertension    NICM (nonischemic cardiomyopathy) (HCC)    a. 06/2012 Echo: EF 45-50%;  b. 08/2013 Echo: EF 20-25%;  c. 10/2013 Adenosine  MV: Fixed septal defect likely representing LBBB-related artifact, no reversible ischemia, EF 43%, low risk.   Paroxysmal atrial tachycardia    a. 07/2013 noted on device interrogation.   Presence of permanent cardiac pacemaker 2013   RA (rheumatoid arthritis) (HCC)    S/P aortic valve replacement    a. 1993 s/p SJM mechanical AVR for aortic stenosis - chronic coumadin .    Shortness of breath dyspnea     Past Surgical History:  Procedure Laterality Date   ANAL FISSURE REPAIR  1970s   AORTIC VALVE REPLACEMENT  1993   St. Jude Mechanical Valve   CATARACT EXTRACTION, BILATERAL Bilateral 02/2020   1 wk apart   INSERT / REPLACE / REMOVE PACEMAKER     PACEMAKER INSERTION  1993   for AV block, not pacemaker dependant   PERMANENT PACEMAKER GENERATOR CHANGE N/A 01/25/2012   Procedure: PERMANENT PACEMAKER GENERATOR CHANGE;  Surgeon: [name], MD;  Location: [facility] CATH LAB;  Service: Cardiovascular;  Laterality: N/A;   PPM GENERATOR CHANGEOUT N/A 07/06/2022   Procedure: PPM GENERATOR CHANGEOUT;  Surgeon: [name], MD;  Location: [facility] INVASIVE CV LAB;  Service: Cardiovascular;  Laterality: N/A;   TEE WITHOUT CARDIOVERSION N/A 08/08/2021   Procedure: TRANSESOPHAGEAL ECHOCARDIOGRAM (TEE);  Surgeon: [name], MD;  Location: [facility] ENDOSCOPY;  Service: Cardiovascular;  Laterality: N/A;  TAVR CT BEFORE TEE    Current Outpatient Medications  Medication Sig Dispense Refill   acetaminophen  (TYLENOL ) 500 MG tablet Take 500-1,000 mg by mouth every 6 (six) hours as needed (for pain.).     alum & mag hydroxide-simeth (MAALOX/MYLANTA) 200-200-20 MG/5ML suspension Take 15 mLs by mouth every 6 (six) hours as needed for indigestion or heartburn.     amoxicillin (AMOXIL) 500 MG capsule Take 2,000 mg by mouth See admin instructions. Take 4 capsules (2000 mg) by  mouth 1 hour prior to dental appointment     Cholecalciferol (VITAMIN D -3 PO) Take 2 tablets by mouth.     famotidine  (PEPCID ) 20 MG tablet Take 1 tablet (20 mg total) by mouth at bedtime. (Patient taking differently: Take 20 mg by mouth as needed.)     guaiFENesin  (MUCINEX ) 600 MG 12 hr tablet Take 600 mg by mouth 2 (two) times daily as needed (congestion/cough).     loperamide  (IMODIUM ) 2 MG capsule Take 1 capsule (2 mg total) by mouth 4 (four) times daily as needed for diarrhea or loose stools.     metoprolol   succinate (TOPROL -XL) 50 MG 24 hr tablet TAKE 1 TABLET BY MOUTH ONCE DAILY WITH  OR  IMMEDIATELY  FOLLOWING  A  MEAL 90 tablet 1   Misc Natural Products (IMMUNE FORMULA PO) Take 1-2 tablets by mouth in the morning. Vitafusion Triple Immune Power Gummy Vitamins     omeprazole  (PRILOSEC) 40 MG capsule Take 1 capsule (40 mg total) by mouth daily as needed (GERD). 30 capsule 3   sacubitril -valsartan  (ENTRESTO ) 97-103 MG Take 1 tablet by mouth 2 (two) times daily. 180 tablet 3   triamcinolone  cream (KENALOG ) 0.1 % APPLY  CREAM EXTERNALLY IN THE MORNING AND AT BEDTIME FOR  MAX  2  WEEKS  AT  A  TIME (Patient taking differently: as needed.) 80 g 0   warfarin (COUMADIN ) 5 MG tablet TAKE 0.5 (ONE-HALF) TABLET BY MOUTH DAILY EXCEPT TAKE 1 TABLET ON MONDAYS, WEDNESDAYS AND FRIDAYS OR AS DIRECTED BY ANTICOAGULATION CLINIC 95 tablet 1   Zoster Vaccine Adjuvanted (SHINGRIX ) injection Inject into the muscle. 1 each 1   No current facility-administered medications for this visit.    Allergies:   Coreg  [carvedilol ], Lisinopril , and Tessalon  [benzonatate ]   Social History:  The patient  reports that he has never smoked. He has never used smokeless tobacco. He reports that he does not drink alcohol and does not use drugs.   Family History:  The patient's family history includes Arthritis in his father and mother; Congestive Heart Failure in his father; Heart disease in his father; Hypertension in his father and mother; Prostate cancer (age of onset: 60) in his father.  ROS:  Please see the history of present illness.    All other systems are reviewed and otherwise negative.   PHYSICAL EXAM:  VS:  There were no vitals taken for this visit. BMI: There is no height or weight on file to calculate BMI. Well nourished, well developed, in no acute distress HEENT: normocephalic, atraumatic Neck: no JVD, carotid bruits or masses Cardiac:  *** RRR; valve is appreciated, no significant murmurs, no rubs, or  gallops Lungs: *** CTA b/l, no wheezing, rhonchi or rales Abd: soft, nontender MS: no deformity or atrophy Ext: *** no edema Skin: warm and dry, no rash Neuro:  No gross deficits appreciated Psych: euthymic mood, full affect  *** PPM site is stable, (R side) no tethering or discomfort   EKG:  not done today  Device interrogation done today and reviewed by myself:  Battery and lead measurements are good ***  08/08/21: TEE  1. The aortic valve has been replaced by a St. Jude Mechanical Aortic  Valve. Aortic valve regurgitation is trivial. Effective orifice area, by  VTI measures 0.73 cm. Aortic valve mean gradient measures 26.0 mmHg.  Aortic valve Vmax measures 3.51 m/s. DVI   0.19. Acceleration time 112 ms.      Severe prosthetic dysfunction noted.   2.  Left ventricular ejection fraction, by estimation, is 30 to 35%. The  left ventricle has moderately decreased function. The left ventricle has  no regional wall motion abnormalities.   3. Right ventricular systolic function is low normal. The right  ventricular size is mildly enlarged.   4. No left atrial/left atrial appendage thrombus was detected.   5. The mitral valve is grossly normal. Moderate mitral valve  regurgitation. No evidence of mitral stenosis. Pulmonary vein blunting  without reversal. VCA of largest MR jet 0.21 cm2.   6. Aortic dilatation noted. There is moderate dilatation of the ascending  aorta, measuring 49 mm.   Conclusion(s)/Recommendation(s): Will send to heart team for  multi-disciplinary discussion.    12/02/2019: TTE IMPRESSIONS   1. Left ventricular ejection fraction, by estimation, is 40 to 45%. The  left ventricle has mildly decreased function. The left ventricle  demonstrates global hypokinesis. Left ventricular diastolic parameters are  consistent with Grade I diastolic  dysfunction (impaired relaxation).   2. Right ventricular systolic function is normal. The right ventricular  size is  normal. There is normal pulmonary artery systolic pressure. The  estimated right ventricular systolic pressure is 35.2 mmHg.   3. Left atrial size was moderately dilated.   4. The mitral valve is normal in structure. Moderate mitral valve  regurgitation. No evidence of mitral stenosis.   5. Tricuspid valve regurgitation is moderate.   6. Prior velocity 3.67m/s, gradient 23 mmHg in 2018. The aortic valve was  not well visualized. Aortic valve regurgitation is not visualized. No  aortic stenosis is present. Aortic valve mean gradient measures 23.0 mmHg.  Aortic valve Vmax measures 3.21  m/s.   7. Aortic dilatation noted. 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   8. The inferior vena cava is normal in size with greater than 50%  respiratory variability, suggesting right atrial pressure of 3 mmHg.   Comparison(s): Prior images reviewed side by side. The left ventricular  function has improved. Prior EF 30-35%.    12/22/2016: TTE Study Conclusions  - Left ventricle: The cavity size was moderately dilated. Wall    thickness was normal. Systolic function was moderately to    severely reduced. The estimated ejection fraction was in the    range of 30% to 35%. Features are consistent with a pseudonormal    left ventricular filling pattern, with concomitant abnormal    relaxation and increased filling pressure (grade 2 diastolic    dysfunction).  - Aortic valve: Severely calcified annulus. Severely calcified    leaflets. There was mild to moderate stenosis. There was trivial    regurgitation.  - Mitral valve: Mildly to moderately calcified annulus. There was    mild to moderate regurgitation. Valve area by pressure half-time:    2.42 cm^2.    Recent Labs: 02/26/2024: ALT 10; TSH 2.35 05/27/2024: BUN 18; Creatinine, Ser 1.34; Hemoglobin 12.9; Platelets 241.0; Potassium 4.1; Sodium 139  02/26/2024: Cholesterol 202; HDL 48.30; LDL Cholesterol 119; Total CHOL/HDL  Ratio 4; Triglycerides 177.0; VLDL 35.4   CrCl cannot be calculated (Patient's most recent lab result is older than the maximum 21 days allowed.).   Wt Readings from Last 3 Encounters:  07/18/24 183 lb (83 kg)  02/26/24 180 lb 4 oz (81.8 kg)  01/22/24 184 lb (83.5 kg)     Other studies reviewed: Additional studies/records reviewed today include: summarized above  ASSESSMENT AND PLAN:  PPM *** intact function *** no programming changes  NICM LVEF  2023 30-35% *** % BiVe pacing *** No symptoms or exam findings of volume OL ***  Mechanical AVR On warfarin, *** monitored and managed with his PMD Severe AVR dysfunction by TEE 2023 > multidisciplinary team > not felt a candidate for re-do Patient prefers not to pursue any further surveillance/testing C/w Dr. Tye  HTN ***   Disposition: ***  Current medicines are reviewed at length with the patient today.  The patient did not have any concerns regarding medicines.  Bonney Charlies Arthur, PA-C 08/08/2024 2:05 PM     [facility] [address] [place] [address] [phone] (office)  [phone] (fax)   "

## 2024-08-11 ENCOUNTER — Ambulatory Visit: Admitting: Physician Assistant

## 2024-08-14 ENCOUNTER — Ambulatory Visit

## 2025-01-19 ENCOUNTER — Ambulatory Visit

## 2025-01-22 ENCOUNTER — Ambulatory Visit
